# Patient Record
Sex: Female | Born: 1970 | Race: Black or African American | Hispanic: No | Smoking: Current every day smoker
Health system: Southern US, Community
[De-identification: ages and names within clinical notes are randomized; demographics above are authoritative.]

## PROBLEM LIST (undated history)

## (undated) DIAGNOSIS — E7800 Pure hypercholesterolemia, unspecified: Secondary | ICD-10-CM

## (undated) DIAGNOSIS — I10 Essential (primary) hypertension: Secondary | ICD-10-CM

## (undated) DIAGNOSIS — K219 Gastro-esophageal reflux disease without esophagitis: Secondary | ICD-10-CM

## (undated) DIAGNOSIS — F419 Anxiety disorder, unspecified: Secondary | ICD-10-CM

## (undated) DIAGNOSIS — F4310 Post-traumatic stress disorder, unspecified: Secondary | ICD-10-CM

## (undated) DIAGNOSIS — F319 Bipolar disorder, unspecified: Secondary | ICD-10-CM

## (undated) DIAGNOSIS — J45909 Unspecified asthma, uncomplicated: Secondary | ICD-10-CM

## (undated) DIAGNOSIS — F32A Depression, unspecified: Secondary | ICD-10-CM

## (undated) DIAGNOSIS — J449 Chronic obstructive pulmonary disease, unspecified: Secondary | ICD-10-CM

## (undated) DIAGNOSIS — E119 Type 2 diabetes mellitus without complications: Secondary | ICD-10-CM

## (undated) DIAGNOSIS — F1910 Other psychoactive substance abuse, uncomplicated: Secondary | ICD-10-CM

## (undated) DIAGNOSIS — F1010 Alcohol abuse, uncomplicated: Secondary | ICD-10-CM

## (undated) DIAGNOSIS — M1990 Unspecified osteoarthritis, unspecified site: Secondary | ICD-10-CM

## (undated) HISTORY — PX: TUBAL LIGATION: SHX77

## (undated) HISTORY — DX: Type 2 diabetes mellitus without complications: E11.9

## (undated) HISTORY — DX: Pure hypercholesterolemia, unspecified: E78.00

## (undated) HISTORY — DX: Depression, unspecified: F32.A

## (undated) HISTORY — DX: Unspecified osteoarthritis, unspecified site: M19.90

---

## 1998-12-26 ENCOUNTER — Emergency Department (HOSPITAL_COMMUNITY): Admission: EM | Admit: 1998-12-26 | Discharge: 1998-12-26 | Payer: Self-pay | Admitting: Emergency Medicine

## 2000-10-10 ENCOUNTER — Inpatient Hospital Stay (HOSPITAL_COMMUNITY): Admission: AD | Admit: 2000-10-10 | Discharge: 2000-10-10 | Payer: Self-pay | Admitting: Obstetrics

## 2000-10-11 ENCOUNTER — Encounter: Payer: Self-pay | Admitting: Obstetrics

## 2001-02-15 ENCOUNTER — Inpatient Hospital Stay (HOSPITAL_COMMUNITY): Admission: AD | Admit: 2001-02-15 | Discharge: 2001-02-15 | Payer: Self-pay | Admitting: *Deleted

## 2001-02-19 ENCOUNTER — Ambulatory Visit (HOSPITAL_COMMUNITY): Admission: RE | Admit: 2001-02-19 | Discharge: 2001-02-19 | Payer: Self-pay | Admitting: *Deleted

## 2001-03-22 ENCOUNTER — Inpatient Hospital Stay (HOSPITAL_COMMUNITY): Admission: AD | Admit: 2001-03-22 | Discharge: 2001-03-22 | Payer: Self-pay | Admitting: Obstetrics & Gynecology

## 2001-05-09 ENCOUNTER — Encounter: Payer: Self-pay | Admitting: Obstetrics & Gynecology

## 2001-05-09 ENCOUNTER — Inpatient Hospital Stay (HOSPITAL_COMMUNITY): Admission: AD | Admit: 2001-05-09 | Discharge: 2001-05-09 | Payer: Self-pay | Admitting: Obstetrics & Gynecology

## 2001-05-19 ENCOUNTER — Inpatient Hospital Stay (HOSPITAL_COMMUNITY): Admission: AD | Admit: 2001-05-19 | Discharge: 2001-05-19 | Payer: Self-pay | Admitting: Obstetrics

## 2001-05-19 ENCOUNTER — Encounter: Payer: Self-pay | Admitting: Obstetrics

## 2001-05-20 ENCOUNTER — Encounter (INDEPENDENT_AMBULATORY_CARE_PROVIDER_SITE_OTHER): Payer: Self-pay | Admitting: Specialist

## 2001-05-20 ENCOUNTER — Inpatient Hospital Stay (HOSPITAL_COMMUNITY): Admission: AD | Admit: 2001-05-20 | Discharge: 2001-05-22 | Payer: Self-pay | Admitting: Obstetrics

## 2001-08-03 ENCOUNTER — Emergency Department (HOSPITAL_COMMUNITY): Admission: EM | Admit: 2001-08-03 | Discharge: 2001-08-03 | Payer: Self-pay | Admitting: Emergency Medicine

## 2002-03-03 ENCOUNTER — Emergency Department (HOSPITAL_COMMUNITY): Admission: EM | Admit: 2002-03-03 | Discharge: 2002-03-03 | Payer: Self-pay | Admitting: *Deleted

## 2002-04-11 ENCOUNTER — Inpatient Hospital Stay (HOSPITAL_COMMUNITY): Admission: EM | Admit: 2002-04-11 | Discharge: 2002-04-14 | Payer: Self-pay | Admitting: Psychiatry

## 2002-04-23 ENCOUNTER — Encounter: Admission: RE | Admit: 2002-04-23 | Discharge: 2002-04-23 | Payer: Self-pay | Admitting: Internal Medicine

## 2002-07-09 ENCOUNTER — Inpatient Hospital Stay (HOSPITAL_COMMUNITY): Admission: EM | Admit: 2002-07-09 | Discharge: 2002-07-14 | Payer: Self-pay | Admitting: Psychiatry

## 2002-09-29 ENCOUNTER — Encounter: Admission: RE | Admit: 2002-09-29 | Discharge: 2002-09-29 | Payer: Self-pay | Admitting: Internal Medicine

## 2002-10-07 ENCOUNTER — Emergency Department (HOSPITAL_COMMUNITY): Admission: EM | Admit: 2002-10-07 | Discharge: 2002-10-07 | Payer: Self-pay | Admitting: Emergency Medicine

## 2002-11-12 ENCOUNTER — Emergency Department (HOSPITAL_COMMUNITY): Admission: EM | Admit: 2002-11-12 | Discharge: 2002-11-12 | Payer: Self-pay | Admitting: Emergency Medicine

## 2003-01-14 ENCOUNTER — Inpatient Hospital Stay (HOSPITAL_COMMUNITY): Admission: EM | Admit: 2003-01-14 | Discharge: 2003-01-17 | Payer: Self-pay | Admitting: Psychiatry

## 2003-03-22 ENCOUNTER — Encounter: Payer: Self-pay | Admitting: Emergency Medicine

## 2003-03-22 ENCOUNTER — Emergency Department (HOSPITAL_COMMUNITY): Admission: EM | Admit: 2003-03-22 | Discharge: 2003-03-22 | Payer: Self-pay | Admitting: Emergency Medicine

## 2003-03-25 ENCOUNTER — Emergency Department (HOSPITAL_COMMUNITY): Admission: EM | Admit: 2003-03-25 | Discharge: 2003-03-25 | Payer: Self-pay | Admitting: Emergency Medicine

## 2003-10-08 ENCOUNTER — Emergency Department (HOSPITAL_COMMUNITY): Admission: EM | Admit: 2003-10-08 | Discharge: 2003-10-08 | Payer: Self-pay | Admitting: Emergency Medicine

## 2005-01-06 ENCOUNTER — Inpatient Hospital Stay (HOSPITAL_COMMUNITY): Admission: EM | Admit: 2005-01-06 | Discharge: 2005-01-09 | Payer: Self-pay | Admitting: Emergency Medicine

## 2005-01-09 ENCOUNTER — Ambulatory Visit: Payer: Self-pay | Admitting: Internal Medicine

## 2007-08-14 ENCOUNTER — Emergency Department (HOSPITAL_COMMUNITY): Admission: EM | Admit: 2007-08-14 | Discharge: 2007-08-14 | Payer: Self-pay | Admitting: Emergency Medicine

## 2007-09-01 ENCOUNTER — Emergency Department (HOSPITAL_COMMUNITY): Admission: EM | Admit: 2007-09-01 | Discharge: 2007-09-02 | Payer: Self-pay | Admitting: Emergency Medicine

## 2007-09-02 ENCOUNTER — Inpatient Hospital Stay (HOSPITAL_COMMUNITY): Admission: AD | Admit: 2007-09-02 | Discharge: 2007-09-05 | Payer: Self-pay | Admitting: Psychiatry

## 2007-09-06 ENCOUNTER — Ambulatory Visit: Payer: Self-pay | Admitting: *Deleted

## 2009-02-21 ENCOUNTER — Emergency Department (HOSPITAL_COMMUNITY): Admission: EM | Admit: 2009-02-21 | Discharge: 2009-02-22 | Payer: Self-pay | Admitting: Emergency Medicine

## 2009-02-22 ENCOUNTER — Ambulatory Visit: Payer: Self-pay | Admitting: *Deleted

## 2009-02-22 ENCOUNTER — Inpatient Hospital Stay (HOSPITAL_COMMUNITY): Admission: AD | Admit: 2009-02-22 | Discharge: 2009-02-25 | Payer: Self-pay | Admitting: *Deleted

## 2010-06-24 ENCOUNTER — Emergency Department (HOSPITAL_COMMUNITY)
Admission: EM | Admit: 2010-06-24 | Discharge: 2010-06-24 | Payer: Self-pay | Source: Home / Self Care | Admitting: Emergency Medicine

## 2010-09-05 LAB — BASIC METABOLIC PANEL
BUN: 13 mg/dL (ref 6–23)
CO2: 24 mEq/L (ref 19–32)
Chloride: 109 mEq/L (ref 96–112)
GFR calc non Af Amer: >60 mL/min (ref >60)
Glucose, Bld: 81 mg/dL (ref 70–99)
Potassium: 4.1 mEq/L (ref 3.5–5.1)
Sodium: 139 mEq/L (ref 135–145)

## 2010-09-05 LAB — CBC
HCT: 42.9 % (ref 36.0–46.0)
Hemoglobin: 14.6 g/dL (ref 12.0–15.0)
MCH: 30.4 pg (ref 26.0–34.0)
MCHC: 34 g/dL (ref 30.0–36.0)
MCV: 89.4 fL (ref 78.0–100.0)

## 2010-09-05 LAB — HEPATIC FUNCTION PANEL
AST: 29 U/L (ref 0–37)
Bilirubin, Direct: <0.1 mg/dL (ref 0.0–0.3)
Total Bilirubin: 0.4 mg/dL (ref 0.3–1.2)

## 2010-09-05 LAB — DIFFERENTIAL
Basophils Relative: 0 % (ref 0–1)
Eosinophils Relative: 1 % (ref 0–5)
Monocytes Absolute: 0.6 10*3/uL (ref 0.1–1.0)
Monocytes Relative: 8 % (ref 3–12)
Neutro Abs: 4.6 10*3/uL (ref 1.7–7.7)

## 2010-10-01 LAB — DIFFERENTIAL
Basophils Absolute: 0 10*3/uL (ref 0.0–0.1)
Basophils Relative: 1 % (ref 0–1)
Neutro Abs: 6.5 10*3/uL (ref 1.7–7.7)
Neutrophils Relative %: 69 % (ref 43–77)

## 2010-10-01 LAB — GC/CHLAMYDIA PROBE AMP, GENITAL
Chlamydia, DNA Probe: NEGATIVE
GC Probe Amp, Genital: NEGATIVE

## 2010-10-01 LAB — RAPID URINE DRUG SCREEN, HOSP PERFORMED
Barbiturates: NOT DETECTED
Cocaine: POSITIVE — AB
Opiates: NOT DETECTED

## 2010-10-01 LAB — CBC
MCHC: 33.9 g/dL (ref 30.0–36.0)
RBC: 4.7 MIL/uL (ref 3.87–5.11)
RDW: 13.6 % (ref 11.5–15.5)

## 2010-10-01 LAB — HEPATIC FUNCTION PANEL
ALT: 15 U/L (ref 0–35)
AST: 31 U/L (ref 0–37)
Albumin: 3.3 g/dL — ABNORMAL LOW (ref 3.5–5.2)
Bilirubin, Direct: 0.1 mg/dL (ref 0.0–0.3)
Bilirubin, Direct: <0.1 mg/dL (ref 0.0–0.3)
Indirect Bilirubin: 0.5 mg/dL (ref 0.3–0.9)

## 2010-10-01 LAB — BASIC METABOLIC PANEL
CO2: 29 mEq/L (ref 19–32)
Calcium: 9.2 mg/dL (ref 8.4–10.5)
Creatinine, Ser: 0.84 mg/dL (ref 0.4–1.2)
GFR calc Af Amer: >60 mL/min (ref >60)
Glucose, Bld: 81 mg/dL (ref 70–99)

## 2010-10-01 LAB — TSH: TSH: 4.097 u[IU]/mL (ref 0.350–4.500)

## 2010-10-01 LAB — WET PREP, GENITAL: Trich, Wet Prep: NONE SEEN

## 2010-10-13 ENCOUNTER — Emergency Department (HOSPITAL_COMMUNITY)
Admission: EM | Admit: 2010-10-13 | Discharge: 2010-10-13 | Disposition: A | Discharge status: Home / Self Care | Payer: Self-pay | Attending: Emergency Medicine | Admitting: Emergency Medicine

## 2010-10-13 DIAGNOSIS — S61509A Unspecified open wound of unspecified wrist, initial encounter: Secondary | ICD-10-CM | POA: Insufficient documentation

## 2010-10-13 DIAGNOSIS — S1093XA Contusion of unspecified part of neck, initial encounter: Secondary | ICD-10-CM | POA: Insufficient documentation

## 2010-10-13 DIAGNOSIS — X789XXA Intentional self-harm by unspecified sharp object, initial encounter: Secondary | ICD-10-CM | POA: Insufficient documentation

## 2010-10-13 DIAGNOSIS — F319 Bipolar disorder, unspecified: Secondary | ICD-10-CM | POA: Insufficient documentation

## 2010-10-13 DIAGNOSIS — F1910 Other psychoactive substance abuse, uncomplicated: Secondary | ICD-10-CM | POA: Insufficient documentation

## 2010-10-13 DIAGNOSIS — S0003XA Contusion of scalp, initial encounter: Secondary | ICD-10-CM | POA: Insufficient documentation

## 2010-10-13 DIAGNOSIS — IMO0002 Reserved for concepts with insufficient information to code with codable children: Secondary | ICD-10-CM | POA: Insufficient documentation
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
## 2010-11-08 NOTE — Discharge Summary (Signed)
Dawn, Huffman NO.:  [id number]   MEDICAL RECORD NO.:  [id number]          PATIENT TYPE:  IPS   LOCATION:  0301                          FACILITY:  [facility]   PHYSICIAN:  Jasmine Pang, M.D. DATE OF BIRTH:  06-Dec-1970   DATE OF ADMISSION:  02/22/2009  DATE OF DISCHARGE:  02/25/2009                               DISCHARGE SUMMARY   IDENTIFICATION:  This is a 40 year old African American female who was  admitted on a voluntary basis on February 22, 2009.   HISTORY OF PRESENT ILLNESS:  This is the fourth or fifth [facility] admission  for this 40 year old with a history of mood disorder and persistent  cocaine use.  She presented with suicidal thoughts of possibly cutting  her wrists.  She has been using cocaine for at least the past 8 months  now and she moved back to [place].  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.  She reports the use  of alcohol has been escalating.  She last drank half a fifth of gin  about 3 days ago and ends up drinking alcohol at least every 2-3 days  coinciding with cocaine use.  She has been smoking rock cocaine and has  used 700 dollars worth within the past couple of weeks.  She then gone  from home being ashamed of herself feeling guilty about her addiction.  She feels miserable with her addiction and feels that it is not getting  her high anymore.  She is tearful, depressed with active suicidal  thoughts.  No homicidal thought.  She has been having suicidal thoughts  daily for the past week.  As indicated above, this is the fourth or  fifth [facility] admission here.  She was last with [place] in March 2009.  She has  a history of multiple substance abuse treatment programs, she says  including [facility], [facility], and several other long term programs in the past.  She has no current outpatient treatment.  For further admission  information, see psychiatric admission assessment.  She was given an  initial axis I  diagnoses of major depression, rule out posttraumatic  stress disorder and alcohol abuse and dependence, and cocaine  dependence.  On axis III, she was given the diagnosis of  gastroesophageal reflux disease with a history of gastric ulcer.  She is  also ruled out for substance-induced asthma.   PHYSICAL FINDINGS:  There were no acute physical or medical problems.  The patient's physical exam was done in the emergency room and is noted  in the record.   DIAGNOSTIC STUDIES:  Urine drug screen was positive for cocaine and  marijuana.  Alcohol level was less than 5.  Normal chemistry with a BUN  of 18, creatinine of 0.84, and normal CBC.  Platelets are 179,000,  hemoglobin 14.6.   HOSPITAL COURSE:  Upon admission, the patient was started on doxycycline  100 mg p.o. b.i.d. for 2 weeks for vaginal infection, this was diagnosed  in the ED, Ambien 10 mg p.o. q.h.s., and Librium detox protocol.  She  was also started on Seroquel 25 mg q.4 h. p.r.n. psychosis and  agitation, Protonix 40 mg daily, Flovent 44 mcg 2 puffs b.i.d.,  albuterol MDI 2 puffs q.a.m. and q.18:00.  In individual sessions, the  patient was casually dressed with fair eye contact.  Speech was normal  rate and flow.  Psychomotor activity was within normal limits.  Mood was  depressed and anxious.  Affect consistent with mood.  There was no  suicidal or homicidal ideation.  No evidence of a thought disorder or  psychosis.  As hospitalization progressed, she became less depressed  though on February 24, 2009, she complained of still being anxious.  Decided to start Celexa and we were anticipating discharge the following  day.  On February 25, 2009, her sleep was good and appetite was good.  Her mood was less depressed and less anxious.  Affect was consistent  with mood.  There was no suicidal or homicidal ideation.  No thoughts of  self-injurious behavior.  No auditory or visual hallucinations.  No  paranoia or delusions.   Thoughts were logical and goal-directed.  Thought content, no predominant theme.  Cognitive was grossly intact.  Insight good.  Judgment good.  Impulse control good.  She was having no  side effects to her medications.  She wanted to go home today and it was  felt, she was safe for discharge.   DISCHARGE DIAGNOSES:  Axis I:  Major depressive, recurrent, severe and  polysubstance dependence.  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  Axis III:  Gastroesophageal reflux disease with a history of gastric  ulcer.  Also, rule out substance-induced asthma.  Axis IV:  Severe (issues with social environment, parenting, burden of  psychiatric illness, burden of medical problems).  Axis V:  Global assessment of functioning was 50 upon discharge.  GAF  was 42 upon admission.  GAF highest past year was 60-65.   DISCHARGE PLANS:  There were no specific activity level or dietary  restrictions.   POSTHOSPITAL CARE PLANS:  The patient will go to the [facility] on  Thursday, March 04, 2009 at 10 a.m.  She will also go to [facility]  for outpatient substance abuse treatment.   DISCHARGE MEDICATIONS:  1. Doxycycline 100 mg twice daily through March 08, 2009.  2. Flovent 44 mcg inhaler twice daily.  3. Albuterol 90 mcg inhaler twice daily.  4. Ambien 10 mg at bedtime if needed.  5. Celexa 20 mg daily.      Jasmine Pang, M.D.  Electronically Signed     BHS/MEDQ  D:  02/25/2009  T:  02/26/2009  Job:  [id number]
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
## 2010-11-11 NOTE — Discharge Summary (Signed)
NAME:  [name], [name] NO.:  [id number]   MEDICAL RECORD NO.:  [id number]                   PATIENT TYPE:  IPS   LOCATION:  0302                                 FACILITY:  [facility]   PHYSICIAN:  Jeanice Lim, M.D.              DATE OF BIRTH:  01/26/1971   DATE OF ADMISSION:  07/09/2002  DATE OF DISCHARGE:  07/14/2002                                 DISCHARGE SUMMARY   IDENTIFYING DATA:  This is a 40 year old African-American female [facility], presenting with suicidal plan to cut self, relapsed on  crack cocaine 1 month ago, feeling increased depressive symptoms, with  irritability and paranoia, as well as anxiety and agitation, endorsing  suicidal ideation and possible visual hallucinations in her peripheral  vision.   ADMISSION MEDICATIONS:  Vistaril 25 mg t.i.d., Seroquel not taking secondary  to sedation, Protonix 40 mg q.a.m. and Paxil 12.5 q.a.m.   ALLERGIES:  No known drug allergies.   PHYSICAL EXAMINATION:  Essentially within normal limits, neurologically  nonfocal.   ROUTINE ADMISSION LABS:  Essentially within normal limits.  Potassium  slightly at 3.2, pregnancy test negative, urine drug screen positive for  cocaine.  Hemoglobin was 12.9.   MENTAL STATUS EXAM:  Medium-built, fully alert, tearful, anxious but  composed, cooperative black female, articulate.  Speech within normal  limits.  Mood depressed, affect anxious and guarded.  Thought process goal  directed, thought content positive for paranoia and possible visual  hallucinations as well as suicidal ideation, contracting for safety,  cognitively intact.  Judgment and insight fair.   ADMISSION DIAGNOSES:   AXIS I:  1. Bipolar disorder not otherwise specified.  2. Post-traumatic stress disorder.  3. Cocaine abuse rule out dependence.   AXIS II:  None.   AXIS III:  Gastroesophageal reflux disease, history of gastric ulcer and  eczema.   AXIS IV:   Moderate, stressor with primary support group and social  environment.   AXIS V:  24/60.   HOSPITAL COURSE:  The patient was admitted and ordered routine p.r.n.  medications, underwent further monitoring, and was encouraged to participate  in individual, group and milieu therapy.  The patient was struggling with  flashbacks and was unable to get a therapist, reported suicidal ideation and  paranoid ideation and cravings at the time of admission, complaining of some  withdrawal symptoms which decreased as she was supportedly detoxed.  She  showed improvement in judgment and insight and decrease in paranoia as  Risperdal was optimized and Paxil optimized for depressive symptoms and  paranoia.  The patient reported motivation to see a therapist and  participated in aftercare planning.   CONDITION ON DISCHARGE:  Markedly improved.  Mood was more euthymic, affect  brighter, thought process goal directed.  Thought content negative for  dangerous ideation or psychotic symptoms.   DISCHARGE MEDICATIONS:  1. Protonix 40 mg b.i.d. f  2.  Tegretol 200 mg b.i.d.  3. Claritin 10 mg q.a.m.  4. Paxil CR 25 mg q.a.m.  5. Risperdal 0.5 q.a.m., q. 3 p.m. and 2 q.h.s.  6. Vistaril 25 mg b.i.d.  7. Trazodone 50 mg q.h.s. p.r.n. insomnia.   DISPOSITION:  The patient was to follow up with Drucie Opitz on February 5  and in [facility] with Dr. Lourdes Sledge and Dr. Kieth Brightly on February 1  at 1:45.   DISCHARGE DIAGNOSES:   AXIS I:  1. Bipolar disorder not otherwise specified.  2. Post-traumatic stress disorder.  3. Cocaine abuse rule out dependence.   AXIS II:  None.   AXIS III:  Gastroesophageal reflux disease, history of gastric ulcer and  eczema.   AXIS IV:  Moderate, stressor with primary support group and social  environment.   AXIS V:  Global assessment of function on discharge was 55.                                                 Jeanice Lim, M.D.    JEM/MEDQ  D:   07/25/2002  T:  07/25/2002  Job:  [id number]

## 2010-11-11 NOTE — Op Note (Signed)
NAME:  [name], [name]              ACCOUNT NO.:  [id number]   MEDICAL RECORD NO.:  [id number]          PATIENT TYPE:  INP   LOCATION:  5032                         FACILITY:  [facility]   PHYSICIAN:  Tyrone Apple. Karleen Hampshire, M.D.DATE OF BIRTH:  31-Oct-1970   DATE OF PROCEDURE:  01/07/2005  DATE OF DISCHARGE:                                 OPERATIVE REPORT   PREOPERATIVE DIAGNOSES:  1.  Periocular cellulitis, O.D.  2.  preseptal cellulitis on CT scan, O.D.   POSTOPERATIVE DIAGNOSES:  1.  Status post examination under anesthesia.  2.  Status post incision and drainage of a right periorbital abscess with      copious irrigation.   PROCEDURE:  1.  Examination under anesthesia.  2.  Incision and drainage of right periorbital abscess.   SURGEON:  Tyrone Apple. Karleen Hampshire, MD.   ANESTHESIA:  General with endotracheal intubation.   INDICATIONS FOR PROCEDURE:  [name] is a 40 year old white female  with a history of substance abuse, who was admitted secondary to a right  periorbital abscess with swelling and deformation of the right periorbit  secondary to the inflammation.  The patient subsequently had a CT scan,  which demonstrated pre-septal inflammation with no intraocular or post-  septal component to the inflammatory process.  However, the physical  examination demonstrated a focal area of inflammation, induration, and  abscess.  This procedure was then indicated to drain the infected  periorbital tissues and to perform an examination under anesthesia.  The  risk and benefits of the procedure were explained to the patient, and the  patient's parent.  Prior to the procedure, informed consent was obtained.   DESCRIPTION OF TECHNIQUE:  The patient was taken to the operating room and  placed in the supine position.  The entire patient's face was prepped and  draped in the usual sterile manner.  Attention was turned to the right eye  after the induction of general anesthesia and  establishment of an  endotracheal airway.  Using an 18-gauge needle, the focal point of the  abscess was penetrated and an incision was made into the subcutaneous tissue  and the purulent material was then aspirated into a 30-mL syringe.  The  purulent aspirate was then sent for a gram stain, CNS.  This procedure and  maneuver was repeated in all possible meridians, keeping the level of  penetration above the periosteum and above the posterior lamella of the lid.  A focal pocket of abscess was released in a defect measuring approximately 2  cm.  The sharp needle was replaced with a 14-gauge blunted cannula and this  was then used to probe into the various pockets of subcutaneous tissue and  to the level of the orbicularis and down to the periosteum to remove any  other pockets of abscess.  Once this was completed, the initial incision was  extended for approximately 0.4 mm and the wound and abscess bed were then  copiously irrigated with Vancomycin and Tobramycin antibiotic solution.  This was repeated multiple times.  Hemostasis was achieved with irrigation  and at the conclusion of this procedure,  TobraDex ointment was instilled  into the incised abscess bed and a Benzoin pressure dressing was applied.  Next, the previously placed protective corneal shield was removed and a lid  speculum was placed into the inferior fornices of the right eye.  The globe  was examined and found to be free.  The conjunctiva was quiet.  The globe  was able to be moved manually in all directions.  The cornea was intact, the  pupil was reactive and round, and the iris was unaffected by the  inflammatory process.  At the conclusion of the procedure, antibiotic  ointment was instilled in the inferior fornices of the right eye, and the  patient subsequently awakened from general anesthesia and returned to the  recovery room awake and in improved condition.       MAS/MEDQ  D:  01/07/2005  T:  01/07/2005  Job:   [id number]

## 2010-11-11 NOTE — Discharge Summary (Signed)
NAME:  [name], [name] NO.:  [id number]   MEDICAL RECORD NO.:  [id number]                   PATIENT TYPE:  IPS   LOCATION:  0301                                 [facility]ILITY[facility]  [facility]   PHYSICIAN:  Jasmine Pang, M.D.              DATE OF BIRTH:  1970-08-03   DATE OF ADMISSION:  01/14/2003  DATE OF DISCHARGE:  01/17/2003                                 DISCHARGE SUMMARY   BRIEF HISTORY OF PRESENT ILLNESS:  The patient is a 40 year old married,  female.  She had a history of cocaine abuse and bipolar disorder.  She  relapsed on cocaine three weeks ago.  She stated she has been on and off  this drug frequently for the past year.  She had lost the custody of her  children to DSS.  She was feeling irritable, sad, hopeless, worthless, and  had positive suicidal ideation.  She denied homicidal ideation.  She was  seen at the [facility] for psychiatric treatment.  She had no significant medical problems presently.  In the past, she has  been diagnosed as positive for syphilis and other STDs treated at the  [facility].  She had no known drug allergies.   For further information, see psychiatric admission assessment.   PHYSICAL EXAMINATION:  See physical examination done by nurse practitioner.   ADMISSION LABORATORY DATA:  UDS was positive for cocaine.  Urinalysis:  Positive wbc's 3-6.  Urine pregnancy test was negative.  The other labs were  not available at the time of this dictation.   HOSPITAL COURSE:  The patient was admitted to the dual diagnosis program.  She was continued on Protonix 40 mg q.a.m., Paxil CR 25 mg q.a.m., trazodone  200 mg q.h.s., Tegretol 100 mg q.h.s., and Risperdal 1 mg p.o. q.h.s.  She  settled into unit routine with no significant problems.  In addition to her  other medications, her Tegretol was increased to 200 mg p.o. b.i.d. and she  was placed on Vistaril 25 mg p.o. b.i.d.  She had a  conjunctivitis and was  given eye drops to each eye b.i.d.  She was also given a nicotine patch 21  mg daily.  She was also given Cipro 500 mg p.o. b.i.d. x 3 days for a  possible urinary tract infection.  The patient tolerated her medications  well with no significant side effects.  She was able to participate  appropriately in unit therapeutic groups and activities including her dual  diagnosis groups.  At the time of discharge, her mental status had improved,  her mood was less depressed, she was no longer suicidal, she was less  anxious, she was more hopeful, there was no homicidal ideation, there was no  psychosis, there was no thought disorder.  Cognitive exam was within normal  limits.  She felt better able to continue her treatment on an outpatient  basis.   DISCHARGE DIAGNOSES:   AXIS I:  1. Bipolar disorder, depressed mood.  2. Cocaine dependence.   AXIS II:  None.   AXIS III:  1. Conjunctivitis.  2. Urinary tract infection.   AXIS IV:  Severe (has lost custody of children).   AXIS V:  Global assessment of functioning current is 50/60.   DISCHARGE MEDICATIONS:  1. Risperidone 1 mg p.o. q.h.s.  2. Trazodone 200 mg p.o. q.h.s.  3. Paxil CR 25 mg daily.  4. Hydroxyzine pamoate 25 mg p.o. b.i.d.  5. Protonix 40 mg p.o. daily.  6. Ciprofloxacin 500 mg p.o. b.i.d. x 3 days (this was actually used for     conjunctivitis rather a urinary tract infection).   ACTIVITY LEVEL:  No restrictions.   DIET:  No restrictions.   POST HOSPITAL CARE PLAN:  Will return to the [facility] January 21, 2003, at 2:00 p.m. to see Jolly Mango.                                               Jasmine Pang, M.D.    BHS/MEDQ  D:  03/05/2003  T:  03/06/2003  Job:  [id number]   cc:   [facility]

## 2010-11-11 NOTE — Discharge Summary (Signed)
NAME:  [name], [name] NO.:  [id number]   MEDICAL RECORD NO.:  [id number]                   PATIENT TYPE:  IPS   LOCATION:  0301                                 FACILITY:  [facility]   PHYSICIAN:  Jeanice Lim, M.D.              DATE OF BIRTH:  14-Jun-1971   DATE OF ADMISSION:  01/14/2003  DATE OF DISCHARGE:  01/17/2003                                 DISCHARGE SUMMARY   IDENTIFYING DATA:  This is a 40 year old married African-American female  with a long history of cocaine abuse and bipolar disorder, relapsing three  weeks prior to admission with suicidal ideation at the time of admission.   PREVIOUS MEDICATIONS:  1. Paxil.  2. Tegretol.  3. Risperdal.  4. Vistaril.  5. Trazodone.   DRUG ALLERGIES:  No known drug allergies.   PHYSICAL EXAMINATION:  GENERAL:  Within normal limits.  NEUROLOGIC:.  Nonfocal.   LABORATORY DATA:  Urine pregnancy test was negative.  Urine drug screen:  Positive for cocaine.  Some white blood cells 3-6 in the urine.   MENTAL STATUS EXAM:  Fully alert in no acute distress, swollen red eyes,  calm, coherent, chewing gum, good eye contact.  Speech: Within normal  limits.  Mood: Irritable, depressed, anxious.  Thought process: Goal  directed; positive suicidal ideation, mild paranoia.  Cognitive: Intact.  Judgment and insight: Fair.   ADMISSION DIAGNOSES:   AXIS I:  1. Bipolar disorder, type II.  2. Cocaine abuse.   AXIS II:  None.   AXIS III:  1. Hepatitis B.  2. Rule out urinary tract infection.   AXIS IV:  Severe problems with secondary to substance abuse and legal  problems.   AXIS V:  25/60   HOSPITAL COURSE:  The patient was admitted, ordered routine p.r.n.  medications, underwent further monitoring, and was encouraged to participate  in individual, group, and milieu therapy.  The patient described a clear  history of mood swings and suicidal ideation.  The patient reported that she  may be able to  stay with the mother until she was able to get into a halfway  house and reported motivation to remain sober.  The patient was seen by  Jasmine Pang, M.D., and reported no psychotic symptoms, no dangerous  ideation, indicating a response to clinical intervention.   CONDITION ON DISCHARGE:  Condition on discharge was improved.  Mood was more  euthymic.  Affect: Brighter.  Thought processes: Goal directed.  Thought  content: Negative for dangerous ideation or psychotic symptoms.   DISCHARGE MEDICATIONS:  1. Risperdal 1 mg q.h.s.  2. Trazodone 200 mg q.h.s.  3. Paxil CR 25 mg q.a.m.  4. Vistaril 50 mg q.8h. p.r.n. anxiety.  5. Protonix 40 mg q.a.m.  6. Cipro 500 mg b.i.d. for three days.   FOLLOW UP:  The patient was to follow up at [facility] on July  28.   DISCHARGE DIAGNOSES:   AXIS I:  1. Bipolar disorder, type II.  2. Cocaine abuse.   AXIS II:  None.   AXIS III:  1. Hepatitis B.  2. Rule out urinary tract infection.   AXIS IV:  Severe problems with secondary to substance abuse and legal  problems.   AXIS V:  Global assessment of functioning on discharge was 55.                                               Jeanice Lim, M.D.    JEM/MEDQ  D:  02/12/2003  T:  02/12/2003  Job:  [id number]

## 2010-11-11 NOTE — Discharge Summary (Signed)
NAME:  [name], [name] NO.:  [id number]   MEDICAL RECORD NO.:  [id number]                   PATIENT TYPE:  IPS   LOCATION:  0303                                 [facility]ILITY[facility]   PHYSICIAN:  Jeanice Lim, M.D.              DATE OF BIRTH:  10/21/1970   DATE OF ADMISSION:  04/11/2002  DATE OF DISCHARGE:  04/14/2002                                 DISCHARGE SUMMARY   IDENTIFYING DATA:  A 40 year old married Caucasian female, presenting with a  history of bipolar disorder, reporting having mood swings, experiencing  anger, depression, suicidal thoughts, planning to cut her wrist.   ADMISSION MEDICATIONS:  Prozac and trazodone in the past, off medications  for 3 months.   ALLERGIES:  No known drug allergies.   PHYSICAL EXAMINATION:  Essentially within normal limits, neurologically  nonfocal.   ROUTINE ADMISSION LABORATORIES:  Alcohol level less than 5.  CBC and CMET  within normal limits.  Urine drug screen positive for cocaine.   MENTAL STATUS EXAM:  Alert, young, casually dressed _____________ female,  cooperative, fair eye contact.  Speech clear, soft spoken, mood anxious,  nauseated.  Affect anxious, irritable at times.  Thought process goal  directed, thought content positive for visual hallucinations, no dangerous  ideation, cognitively intact.  Judgment and insight fair to poor.   ADMISSION DIAGNOSES:   AXIS I:  1. Depressive disorder not otherwise specified.  2. Cocaine abuse.  3. Rule out post-traumatic stress disorder.   AXIS II:  None.   AXIS III:  History of ulcers and elevated blood pressure.   AXIS IV:  Moderate stressors, including house and economic and other  psychosocial stressors.   AXIS V:  30/65.   HOSPITAL COURSE:  The patient was admitted and ordered routine p.r.n.  medications, underwent further monitoring, and was encouraged to participate  in individual, group and milieu therapy, including substance abuse  treatment.  The patient was resumed on Nexium, Seroquel, Paxil targeting  depressive symptoms and Trileptal targeting mood lability.  Trileptal was  discontinued and Tegretol started which the patient had tolerated well in  the past, the patient reported positive response to medication changes and  crisis intervention.   CONDITION ON DISCHARGE:  Improved.  Mood was more stable, affect brighter,  thought process goal directed.  Thought content negative for dangerous  ideation or psychotic symptoms.  The patient reported motivation to remain  sober and  be compliant with followup plan.   DISCHARGE MEDICATIONS:  1. Protonix 40 mg 2 q.a.m.  2. Seroquel 25 mg b.i.d. and 2 q.h.s.  3. Paxil CR 12.5 mg q.a.m.  4. Tegretol 200 mg b.i.d.   DISPOSITION:  The patient was to follow up with the [facility] on October 22 at 1:30.   DISCHARGE DIAGNOSES:   AXIS I:  1. Depressive disorder not otherwise specified.  2. Cocaine abuse.  3.  Rule out post-traumatic stress disorder.   AXIS II:  None.   AXIS III:  History of ulcers and elevated blood pressure.   AXIS IV:  Moderate stressors, including house and economic and other  psychosocial stressors.   AXIS V:  Global assessment of function on discharge was 55.                                                 Jeanice Lim, M.D.    JEM/MEDQ  D:  05/15/2002  T:  05/16/2002  Job:  [id number]

## 2010-11-11 NOTE — Discharge Summary (Signed)
[name], [name] NO.:  [id number]   MEDICAL RECORD NO.:  [id number]          PATIENT TYPE:  INP   LOCATION:  5032                         FACILITY:  [facility]   PHYSICIAN:  Alvester Morin, M.D.  DATE OF BIRTH:  [birth year]   DATE OF ADMISSION:  01/06/2005  DATE OF DISCHARGE:  01/09/2005                                 DISCHARGE SUMMARY   DISCHARGE DIAGNOSES:  1.  Periorbital preseptal cellulitis - resolving.  2.  Bipolar disorder - stabilizing on discharge.  3.  Cocaine abuse - urine drug screen positive for cocaine and      benzodiazepines.  4.  Hypokalemia - resolving.   DISCHARGE MEDICATION:  Doxycycline 100 mg one p.o. b.i.d. x10 days.   CONDITION ON DISCHARGE:  The patient was in stable condition at discharge  and was told to follow up in the outpatient clinic with Manning Charity,  M.D., on January 13, 2005, at 2:30 p.m. for a stat BMET and CBC and evaluation  of her periorbital cellulitis.  Patient was also advised to follow up with  Dr. Karleen Hampshire at her convenience.   PROCEDURES:  1.  CT of the orbit with contrast on January 05, 2005, showed:      1.  Right orbital soft tissue swelling most consistent with preseptal          periorbital cellulitis.      2.  Globe and post septal orbit, otherwise unremarkable.      3.  Moderate inflammation of the lacrimal gland is associated.  2.  Chest x-ray January 07, 2005, showed no acute disease.  3.  Incision and drainage of the right periorbital abscess with copious      irrigation on January 07, 2005.   CONSULTATION:  Tyrone Apple. Karleen Hampshire, M.D., ophthalmology.   ADMISSION HISTORY AND PHYSICAL WITH LABS:  This is a 40 year old female with  a history of hepatitis B, conjunctivitis, cocaine abuse and bipolar disease  who presents to the emergency department with a three-day history of  gradually worsening swelling and redness of her right eyelid.  The patient  was helping her friend move out and was cleaning his house and  slept on the  couch.  When she awoke, she had a bump on her eyelid.  She thinks it was a  spider bite.  The friend has a couple of dogs but they were not present when  she was there.  Currently she has right-sided facial pain x1 day and a  headache.  She thinks she has been congested with allergies recently.  She  also recently had an ear infection on the right side with drainage a couple  of weeks ago. She tried peroxide and Neosporin on the bug bite with no  relief.  She reports a  history of ear tubes when she was [age].  No other  HEENT surgeries.  She was also recently discharged from prison on October 08, 2004.   PAST MEDICAL HISTORY:  1.  Hepatitis B.  2.  Conjunctivitis.  3.  UTI.  4.  Bipolar type II with suicidal ideations,  visual hallucinations and post      traumatic stress disorder.  5.  Cocaine abuse.  6.  Hypokalemia.  7.  GERD.  8.  Gestational hypertension.  9.  Bilateral tubal ligation in November of 2002.   PHYSICAL EXAMINATION:  GENERAL APPEARANCE:  She was somnolent but arousable,  in no acute distress.  VITAL SIGNS:  On admission, pulse 80, blood pressure 142/84, temperature  98.4, respiratory rate 18, and she was sating 99% on room air.  HEENT:  Eyes:  Her right eyelid was swollen with erythema and edema.  Question pustular collection at the top of the lid.  ENT:  Bilateral mild  erythema on the periphery of her tympanic membranes.  No fluid observed.  Cerumen in the ear canal.  Canal is without erythema, no discharge.  NECK:  No masses.  Right side was tender.  LUNGS:  Clear to auscultation bilaterally.  CARDIOVASCULAR:  Regular rate and rhythm.  No murmurs.  ABDOMEN:  Soft and nontender.  Positive bowel sounds. No CVA tenderness.  EXTREMITIES:  There were 2+ radial artery/2+ dorsalis pedis pulses  bilaterally.  Feet with multiple areas of skin breakdown between the toes  and the onychomycosis of the nails.  SKIN:  Warm and dry.  MUSCULOSKELETAL:  Moved  all four extremities spontaneously.  PSYCHIATRIC:  Appropriate.   LABORATORY DATA:  In the ER, she had an ISTAT which showed a hemoglobin of  15.6, hematocrit 46.0, sodium 139, potassium 3.3, chloride 106, glucose 79,  BUN 6.  When she had a proper complete metabolic panel approximately 12  hours after being seen in the emergency room, her sodium was 138, potassium  3.5, chloride 109, bicarb 21, glucose 100, BUN 3, creatinine 0.6.  Total  bilirubin 0.6, alkaline phosphatase 65, AST 17, ALT 11, total protein 5.8,  albumin 2.9, calcium 8.6.  CBC on admission showed a white count of 12.2,  hemoglobin 13.9, hematocrit 41.6, platelets 249 with an absolute neutrophil  count of 9.7.  Her blood alcohol level was less than 5.  Her Beta HCG was  negative.   HOSPITAL COURSE:  PROBLEM #1 -  PERIORBITAL PRESEPTAL CELLULITIS:  The  patient was admitted to a regular floor and ophthalmology was consulted  immediately with a diagnosis of preseptal cellulitis.  During her stay, she  had an incision and drainage of the right periorbital abscess with  irrigation done by Dr. Karleen Hampshire.  She was started on vancomycin and Zosyn  while the cultures from the pus in the abscess were pending.  While she was  in the hospital, her white count slowly resolved and on discharge, was  normal at 9.2; her hemoglobin remained stable.  Throughout her stay, we  worked her up for various sexually transmitted diseases that might have made  her more susceptible to getting periorbital cellulitis such as HIV which was  nonreactive, syphilis which was also nonreactive.  Her GC and Chlamydia  results were negative.  Her TSH was normal as was her free T4 and the right  eyelid culture showed few methicillin resistant Staphylococcus aureus which  were sensitive to tetracyclines.  Her blood cultures were negative x2 sets  and her anaerobic cultures were negative.  Therefore, we felt it would be safe to let the patient go home on a 10-day  course of Doxycycline after she  had received a four-day course of vancomycin and Zosyn while in the  hospital.  She was given Doxycycline 100 mg 20 tablets from the outpatient  clinic so that we would insure her compliance of taking the medication.   PROBLEM #2 -  BIPOLAR DISORDER:  The patient was extremely lethargic on the  day of admission and we feel that this was most likely secondary to her  recent cocaine use and benzodiazepine use.  As the next day, the patient was  more awake and alert.  She denied any suicidal ideation, homicidal ideation  and her mood was stable and upbeat during her hospital admission.  We  actually had arranged for Dr. Jeanie Sewer to see the patient but since she was  stable as far as her bipolar disease was considered, we felt that it would  be safe to let her be discharged and we gave her information to follow up  with behavioral health to address her psychiatric needs.   PROBLEM #3 -  COCAINE ABUSE:  Her UDS on admission was positive for cocaine  and benzodiazepines.  She has a history of cocaine abuse.  We advised the  patient to go to [facility] on the day of discharge to address her substance abuse  needs and she was agreeable to that.   PROBLEM #4 -  HYPOKALEMIA:  While she was in the hospital, the patient  received several doses of potassium chloride in order to increase her  potassium level and it was slowly resolving throughout her admission and on  the day of discharge, it had gone back down to 3.1 so we gave her more  potassium chloride as she was leaving the hospital and asked her to follow  up with Dr. Lyda Perone in the outpatient clinic within a week to have her  potassium level checked and to be placed on potassium chloride at that time  if needed.   DISCHARGE LABORATORY DATA AND VITALS:  On January 09, 2005, the patient's  sodium was 141, potassium 3.1, chloride 109, bicarb 25, glucose 125, BUN 5,  creatinine 1.0, calcium 8.5.  Magnesium level was ordered  but never  received.  Her white cell count was 9.2, hemoglobin 12.5, hematocrit 37.4,  platelets 247.   Vital signs on discharge showed a temperature of 98, pulse 77, respirations  20, blood pressure 151/99, and she was saturating at 96% on room air.   PENDING LABS:  None.       EA/MEDQ  D:  01/25/2005  T:  01/26/2005  Job:  [id number]   cc:   Casimiro Needle A. Karleen Hampshire, M.D.  [address] [address]  [place]  [place] [address]  Fax: [phone]

## 2010-11-11 NOTE — Op Note (Signed)
[facility]  Patient:    [name], [name] Visit Number: [id number] MRN: [id number]          Service Type: OBS Location: 910A [id number] [id number] Attending Physician:  Tammi Sou Dictated by:   Conni Elliot, M.D. Proc. Date: 05/21/01 Admit Date:  05/20/2001 Discharge Date: 05/22/2001                             Operative Report  PREOPERATIVE DIAGNOSES:       Desire for surgical sterilization.  POSTOPERATIVE DIAGNOSES:      Desire for surgical sterilization.  OPERATION:                    Modified bilateral Pomeroy tubal ligation.  OPERATOR:                     Conni Elliot, M.D.  ANESTHESIA:                   General orotracheal.  FINDINGS:                     Uterus, tubes, and ovaries normal for post gravid state.  PROCEDURE:                    After placing the patient under general orotracheal anesthesia, the patient was supine and was prepped and draped in a sterile fashion.  A periumbilical incision was made and incision was made through the skin and subcutaneous fascia.  Peritoneal cavity entered.  Right fallopian tube was identified and grasped with Babcock clamp, followed out to fimbriated end.  A segment of tube was brought into the operative field, double suture ligated, and approximately 1.5-2 cm segment excised.  Hemostasis adequate.  Same procedure done on the opposite side.  Hemostasis was again adequate.  Anterior peritoneum, fascia, subcutaneous, and skin were closed in routine fashion.  Estimated blood loss less than 10 cc.  Needle and sponge counts correct. Dictated by:   Conni Elliot, M.D. Attending Physician:  Tammi Sou DD:  06/13/01 TD:  06/13/01 Job: [id number]
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
## 2011-03-20 LAB — BASIC METABOLIC PANEL
BUN: 11
Chloride: 107
Potassium: 3.6
Sodium: 140

## 2011-03-20 LAB — DIFFERENTIAL
Eosinophils Absolute: 0
Eosinophils Relative: 0
Lymphs Abs: 2.9
Monocytes Absolute: 0.6
Monocytes Relative: 8

## 2011-03-20 LAB — CBC
HCT: 36.1
Hemoglobin: 12.3
MCV: 85.9
Platelets: 275
Platelets: 277
RBC: 4.2
RDW: 15.1
WBC: 7.5
WBC: 8

## 2011-03-20 LAB — LIPID PANEL
HDL: 50
LDL Cholesterol: 77
Total CHOL/HDL Ratio: 2.9
Triglycerides: 99
VLDL: 20

## 2011-03-20 LAB — RAPID URINE DRUG SCREEN, HOSP PERFORMED: Cocaine: POSITIVE — AB

## 2011-03-20 LAB — VALPROIC ACID LEVEL: Valproic Acid Lvl: 37.9 — ABNORMAL LOW

## 2011-03-20 LAB — HEPATIC FUNCTION PANEL
AST: 14
Albumin: 2.8 — ABNORMAL LOW
Total Bilirubin: 0.5
Total Protein: 5.9 — ABNORMAL LOW

## 2011-03-20 LAB — PREGNANCY, URINE: Preg Test, Ur: NEGATIVE

## 2011-03-20 LAB — ETHANOL: Alcohol, Ethyl (B): <5

## 2011-07-25 ENCOUNTER — Emergency Department (HOSPITAL_COMMUNITY)
Admission: EM | Admit: 2011-07-25 | Discharge: 2011-07-26 | Disposition: A | Discharge status: Home / Self Care | Payer: Medicaid Other | Attending: Emergency Medicine | Admitting: Emergency Medicine

## 2011-07-25 ENCOUNTER — Encounter (HOSPITAL_COMMUNITY): Payer: Self-pay | Admitting: *Deleted

## 2011-07-25 DIAGNOSIS — F329 Major depressive disorder, single episode, unspecified: Secondary | ICD-10-CM | POA: Insufficient documentation

## 2011-07-25 DIAGNOSIS — F1920 Other psychoactive substance dependence, uncomplicated: Secondary | ICD-10-CM | POA: Insufficient documentation

## 2011-07-25 DIAGNOSIS — R5381 Other malaise: Secondary | ICD-10-CM | POA: Insufficient documentation

## 2011-07-25 DIAGNOSIS — Z79899 Other long term (current) drug therapy: Secondary | ICD-10-CM | POA: Insufficient documentation

## 2011-07-25 DIAGNOSIS — F3289 Other specified depressive episodes: Secondary | ICD-10-CM | POA: Insufficient documentation

## 2011-07-25 HISTORY — DX: Post-traumatic stress disorder, unspecified: F43.10

## 2011-07-25 HISTORY — DX: Anxiety disorder, unspecified: F41.9

## 2011-07-25 HISTORY — DX: Bipolar disorder, unspecified: F31.9

## 2011-07-25 LAB — URINALYSIS, ROUTINE W REFLEX MICROSCOPIC
Nitrite: NEGATIVE
Specific Gravity, Urine: 1.026 (ref 1.005–1.030)
pH: 5.5 (ref 5.0–8.0)

## 2011-07-25 LAB — DIFFERENTIAL
Basophils Relative: 0 % (ref 0–1)
Eosinophils Absolute: 0 10*3/uL (ref 0.0–0.7)
Eosinophils Relative: 0 % (ref 0–5)
Lymphs Abs: 3 10*3/uL (ref 0.7–4.0)

## 2011-07-25 LAB — RAPID URINE DRUG SCREEN, HOSP PERFORMED
Amphetamines: NOT DETECTED
Benzodiazepines: NOT DETECTED
Cocaine: POSITIVE — AB
Opiates: NOT DETECTED
Tetrahydrocannabinol: POSITIVE — AB

## 2011-07-25 LAB — COMPREHENSIVE METABOLIC PANEL
ALT: 15 U/L (ref 0–35)
Albumin: 4 g/dL (ref 3.5–5.2)
BUN: 8 mg/dL (ref 6–23)
Calcium: 10.3 mg/dL (ref 8.4–10.5)
GFR calc Af Amer: >90 mL/min (ref >90)
Glucose, Bld: 93 mg/dL (ref 70–99)
Sodium: 136 meq/L (ref 135–145)
Total Protein: 8.3 g/dL (ref 6.0–8.3)

## 2011-07-25 LAB — CBC
MCH: 29.1 pg (ref 26.0–34.0)
MCHC: 34 g/dL (ref 30.0–36.0)
MCV: 85.7 fL (ref 78.0–100.0)
Platelets: 251 10*3/uL (ref 150–400)
RBC: 4.46 MIL/uL (ref 3.87–5.11)

## 2011-07-25 LAB — GLUCOSE, CAPILLARY: Glucose-Capillary: 89 mg/dL (ref 70–99)

## 2011-07-25 LAB — ETHANOL: Alcohol, Ethyl (B): <11 mg/dL (ref 0–11)

## 2011-07-25 LAB — POCT PREGNANCY, URINE: Preg Test, Ur: NEGATIVE

## 2011-07-25 LAB — URINE MICROSCOPIC-ADD ON

## 2011-07-25 MED ORDER — HYDROXYZINE PAMOATE 50 MG PO CAPS
50.0000 mg | ORAL_CAPSULE | ORAL | Status: DC
  Administered 2011-07-26: 50 mg via ORAL
  Filled 2011-07-25 (×2): qty 1

## 2011-07-25 MED ORDER — LORAZEPAM 1 MG PO TABS: 1.0000 mg | ORAL_TABLET | Freq: Three times a day (TID) | ORAL | Status: DC | PRN

## 2011-07-25 MED ORDER — ZOLPIDEM TARTRATE 5 MG PO TABS: 5.0000 mg | ORAL_TABLET | Freq: Every evening | ORAL | Status: DC | PRN

## 2011-07-25 MED ORDER — ONDANSETRON HCL 4 MG PO TABS: 4.0000 mg | ORAL_TABLET | Freq: Three times a day (TID) | ORAL | Status: DC | PRN

## 2011-07-25 MED ORDER — ACETAMINOPHEN 325 MG PO TABS: 650.0000 mg | ORAL_TABLET | ORAL | Status: DC | PRN

## 2011-07-25 MED ORDER — ALUM & MAG HYDROXIDE-SIMETH 200-200-20 MG/5ML PO SUSP: 30.0000 mL | ORAL | Status: DC | PRN

## 2011-07-25 MED ORDER — CITALOPRAM HYDROBROMIDE 20 MG PO TABS
30.0000 mg | ORAL_TABLET | ORAL | Status: DC
  Administered 2011-07-26: 30 mg via ORAL
  Filled 2011-07-25 (×2): qty 1

## 2011-07-25 MED ORDER — RISPERIDONE 0.5 MG PO TABS: 0.5000 mg | ORAL_TABLET | Freq: Every evening | ORAL | Status: DC

## 2011-07-25 MED ORDER — RISPERIDONE 2 MG PO TABS
2.0000 mg | ORAL_TABLET | ORAL | Status: DC
  Administered 2011-07-26: 2 mg via ORAL
  Filled 2011-07-25: qty 1

## 2011-07-25 MED ORDER — FAMOTIDINE 20 MG PO TABS: 10.0000 mg | ORAL_TABLET | Freq: Every day | ORAL | Status: DC

## 2011-07-25 MED ORDER — NICOTINE 21 MG/24HR TD PT24: 21.0000 mg | MEDICATED_PATCH | Freq: Every day | TRANSDERMAL | Status: DC

## 2011-07-25 MED ORDER — INSULIN ASPART 100 UNIT/ML ~~LOC~~ SOLN: 0.0000 [IU] | Freq: Three times a day (TID) | SUBCUTANEOUS | Status: DC

## 2011-07-25 MED ORDER — ALBUTEROL SULFATE HFA 108 (90 BASE) MCG/ACT IN AERS: 2.0000 | INHALATION_SPRAY | Freq: Four times a day (QID) | RESPIRATORY_TRACT | Status: DC | PRN

## 2011-07-25 NOTE — ED Notes (Signed)
Pt has 2 pt belonging bags locked in locker 40

## 2011-07-25 NOTE — ED Notes (Signed)
Spoke with lab & was informed labs in process, [place], RN informed.

## 2011-07-25 NOTE — ED Provider Notes (Signed)
History     CSN: [id number]  Arrival date & time 07/25/11  1724   First MD Initiated Contact with Patient 07/25/11 1817      Chief Complaint  Patient presents with  . V70.1    (Consider location/radiation/quality/duration/timing/severity/associated sxs/prior treatment) Patient is a 41 y.o. female presenting with mental health disorder. The history is provided by the patient.  Mental Health Problem The current episode started 1 to 2 weeks ago. This is a recurrent problem.  The onset of the illness is precipitated by a stressful event. The degree of incapacity that she is experiencing as a consequence of her illness is moderate. Sequelae of the illness include homelessness. Additional symptoms of the illness include anhedonia, appetite change, fatigue and feelings of worthlessness. Additional symptoms of the illness do not include no headaches or no abdominal pain. She admits to suicidal ideas. She does not have a plan to commit suicide. She contemplates harming herself. She has not already injured self. She does not contemplate injuring another person. She has not already  injured another person. Risk factors that are present for mental illness include a history of mental illness and substance abuse.   Pt with hx of depression presents with increased feelings of depression x the past several weeks, which she attributes to family problems and "having too much on my plate." She has hx of admissions to [facility] for this in the past. Currently takes Risperdal. She additionally has hx of cocaine abuse; she had been clean for 10 months but relapsed 2 days ago and states she has used $500 worth. She had been staying with her mom but states she was kicked out of the home due to her cocaine use and is currently homeless. Denies specific suicidal ideation but does state that she wants to cut her wrists. Denies homicidal ideation.  Past Medical History  Diagnosis Date  . Manic-depressive disorder   . Anxiety    . PTSD (post-traumatic stress disorder)     Past Surgical History  Procedure Date  . Tubal ligation     No family history on file.  History  Substance Use Topics  . Smoking status: Current Everyday Smoker -- 0.5 packs/day  . Smokeless tobacco: Not on file  . Alcohol Use: Yes     ocassionally    OB History    Grav Para Term Preterm Abortions TAB SAB Ect Mult Living                  Review of Systems  Constitutional: Positive for appetite change and fatigue.  Gastrointestinal: Negative for abdominal pain.  Neurological: Negative for headaches.  All other systems reviewed and are negative.    Allergies  Review of patient's allergies indicates no known allergies.  Home Medications   Current Outpatient Rx  Name Route Sig Dispense Refill  . ALBUTEROL SULFATE HFA 108 (90 BASE) MCG/ACT IN AERS Inhalation Inhale 2 puffs into the lungs every 6 (six) hours as needed. For asthma relief    . CITALOPRAM HYDROBROMIDE 10 MG PO TABS Oral Take 30 mg by mouth every morning.    [name] HYDROXYZINE PAMOATE 50 MG PO CAPS Oral Take 50 mg by mouth every morning.    . IBUPROFEN 800 MG PO TABS Oral Take 800 mg by mouth every 8 (eight) hours as needed. For pain    . RANITIDINE HCL 300 MG PO TABS Oral Take 300 mg by mouth at bedtime.    [name] RISPERIDONE 0.5 MG PO TABS Oral Take  0.5 mg by mouth every evening.    [name] RISPERIDONE 1 MG PO TABS Oral Take 2 mg by mouth every morning.      BP 146/94  Pulse 71  Temp(Src) 98.8 F (37.1 C) (Oral)  Resp 16  Wt 162 lb (73.483 kg)  SpO2 99%  LMP 06/29/2011  Physical Exam  Nursing note and vitals reviewed. Constitutional: She is oriented to person, place, and time. She appears well-developed and well-nourished. No distress.  HENT:  Head: Normocephalic and atraumatic.  Right Ear: External ear normal.  Left Ear: External ear normal.  Mouth/Throat: Oropharynx is clear and moist. No oropharyngeal exudate.  Eyes: Conjunctivae and EOM are normal. Pupils are  equal, round, and reactive to light.  Neck: Normal range of motion.  Cardiovascular: Normal rate, regular rhythm and normal heart sounds.   Pulmonary/Chest: Effort normal and breath sounds normal.  Abdominal: Soft. Bowel sounds are normal. There is no tenderness.  Musculoskeletal: Normal range of motion.  Neurological: She is alert and oriented to person, place, and time. No cranial nerve deficit. Coordination normal.  Skin: Skin is warm and dry. She is not diaphoretic.  Psychiatric: Her speech is normal. Her affect is blunt. She is withdrawn. She is not agitated. Thought content is not paranoid. She exhibits a depressed mood. She expresses suicidal ideation. She expresses no homicidal ideation. She expresses no suicidal plans.    ED Course  Procedures (including critical care time)  Labs Reviewed  URINE RAPID DRUG SCREEN (HOSP PERFORMED) - Abnormal; Notable for the following:    Cocaine POSITIVE (*)    Tetrahydrocannabinol POSITIVE (*)    All other components within normal limits  CBC - Abnormal; Notable for the following:    WBC 12.6 (*)    All other components within normal limits  DIFFERENTIAL - Abnormal; Notable for the following:    Neutro Abs 8.7 (*)    All other components within normal limits  COMPREHENSIVE METABOLIC PANEL - Abnormal; Notable for the following:    Total Bilirubin 0.2 (*)    All other components within normal limits  URINALYSIS, ROUTINE W REFLEX MICROSCOPIC - Abnormal; Notable for the following:    Bilirubin Urine SMALL (*)    Ketones, ur >80 (*)    Leukocytes, UA TRACE (*)    All other components within normal limits  GLUCOSE, CAPILLARY  ETHANOL  POCT PREGNANCY, URINE  URINE MICROSCOPIC-ADD ON   No results found.   No diagnosis found.    MDM  Pt medically clear to move to psych ED. ACT aware and will see.        Grant Fontana, [place] 07/25/11 2226

## 2011-07-25 NOTE — BH Assessment (Signed)
Assessment Note   [name] is an 41 y.o. female. Pt reported to the [facility] with a chief complaint of depression and suicidal ideation. Pt states that she has had increased depression over the last month due to conflict and constant negativity from her mother, recent release from prison [employer] 2012), and coping with the loss of her brother in Sept. 2012. Pt reports her depressive symptoms include: increased sleep (8+ hours daily with multiple naps), anger/irritability, fatigue and loss of interest in usual pleasures.Pt states she has felt suicidal with a plan to cut her wrists though is unsure if she has intent at the current time. Pt reports a hx of manic depressive disorder, anxiety disorder, and PTSD. Pt currently endorses SI with plan and depression. Pt denies HI/AVH.  Pt reports she was recently released from [facility] in November 2012 (after serving 10 months for possession with intent to sell) and was prescribed medication there. Pt states she currently seeks treatment at [facility] though they have not prescribed any new medications at this time. Pt is currently on probation and lives with her mother who pt reports is an alcoholic.  Pt reports a history of substance abuse and a recent relapse after being sober for 10 months. Pt last used 2 days ago which included: Crack Cocaine- $300-400 worth, THC-"couple tokes" and ETOH- 2-3 glasses of beer and liquor. Pt states she is further depressed due to the recent relapse.   Pt is currently pending a tele-psych for disposition.   Axis I: Depressive Disorder NOS and Substance Abuse Axis II: Deferred Axis III:  Past Medical History  Diagnosis Date  . Manic-depressive disorder   . Anxiety   . PTSD (post-traumatic stress disorder)    Axis IV: housing problems, problems related to social environment and problems with primary support group Axis V: 41-50 serious symptoms  Past Medical History:    Past Medical History  Diagnosis Date  . Manic-depressive disorder   . Anxiety   . PTSD (post-traumatic stress disorder)     Past Surgical History  Procedure Date  . Tubal ligation     Family History: No family history on file.  Social History:  reports that she has been smoking.  She does not have any smokeless tobacco history on file. She reports that she drinks alcohol. She reports that she uses illicit drugs.  Additional Social History:    Allergies: No Known Allergies  Home Medications:  Medications Prior to Admission  Medication Dose Route Frequency Provider Last Rate Last Dose  . acetaminophen (TYLENOL) tablet 650 mg  650 mg Oral Q4H PRN Grant Fontana, PA      . albuterol (PROVENTIL HFA;VENTOLIN HFA) 108 (90 BASE) MCG/ACT inhaler 2 puff  2 puff Inhalation Q6H PRN Grant Fontana, PA      . alum & mag hydroxide-simeth (MAALOX/MYLANTA) 200-200-20 MG/5ML suspension 30 mL  30 mL Oral PRN Grant Fontana, PA      . citalopram (CELEXA) tablet 30 mg  30 mg Oral Q0700 Grant Fontana, [place]      . famotidine (PEPCID) tablet 10 mg  10 mg Oral Daily Grant Fontana, [place]      . hydrOXYzine (VISTARIL) capsule 50 mg  50 mg Oral Q0700 Grant Fontana, [place]      . insulin aspart (novoLOG) injection 0-9 Units  0-9 Units Subcutaneous TID WC Grant Fontana, [place]      . LORazepam (ATIVAN) tablet 1 mg  1 mg Oral Q8H PRN Grant Fontana,  PA      . nicotine (NICODERM CQ - dosed in mg/24 hours) patch 21 mg  21 mg Transdermal Daily Grant Fontana, [place]      . ondansetron [facility]) tablet 4 mg  4 mg Oral Q8H PRN Grant Fontana, PA      . risperiDONE (RISPERDAL) tablet 0.5 mg  0.5 mg Oral QPM Grant Fontana, PA      . risperiDONE (RISPERDAL) tablet 2 mg  2 mg Oral Q0700 Grant Fontana, [place]      . zolpidem [facility]) tablet 5 mg  5 mg Oral QHS PRN Grant Fontana, PA       No current outpatient prescriptions on file as of 07/25/2011.    OB/GYN Status:  Patient's  last menstrual period was 06/29/2011.  General Assessment Data Location of Assessment: [facility] Living Arrangements: Parent (mother) Can pt return to current living arrangement?: No (pt is unsure-currently working w/probation Technical sales engineer on housin) Admission Status: Voluntary Is patient capable of signing voluntary admission?: Yes Transfer from: Acute Hospital Referral Source: Self/Family/Friend  Education Status Is patient currently in school?: No  Risk to self Suicidal Ideation: Yes-Currently Present Suicidal Intent: No-Not Currently/Within Last 6 Months Is patient at risk for suicide?: Yes Suicidal Plan?: Yes-Currently Present Specify Current Suicidal Plan: cut wrists Access to Means: Yes (knives in the home) Specify Access to Suicidal Means: has knives in the home What has been your use of drugs/alcohol within the last 12 months?: Crack Cocaine, Alcohol, THC Previous Attempts/Gestures: Yes How many times?: 2  Other Self Harm Risks: none noted Triggers for Past Attempts: Other (Comment) (depression) Intentional Self Injurious Behavior: None Family Suicide History: No Recent stressful life event(s): Other (Comment);Conflict (Comment);Legal Issues (released from prision in 11/12, conflict with mother, relaps) Persecutory voices/beliefs?: No Depression: Yes Depression Symptoms: Tearfulness;Isolating;Fatigue;Guilt;Feeling angry/irritable;Loss of interest in usual pleasures Substance abuse history and/or treatment for substance abuse?: Yes Suicide prevention information given to non-admitted patients: Not applicable  Risk to Others Homicidal Ideation: No-Not Currently/Within Last 6 Months Thoughts of Harm to Others: No-Not Currently Present/Within Last 6 Months Current Homicidal Intent: No-Not Currently/Within Last 6 Months Current Homicidal Plan: No-Not Currently/Within Last 6 Months Access to Homicidal Means: No Identified Victim: none History of harm to others?: No Assessment of  Violence: None Noted Violent Behavior Description: pt calm and cooperative during assessment Does patient have access to weapons?: No Criminal Charges Pending?: No Does patient have a court date: No  Psychosis Hallucinations: None noted Delusions: None noted  Mental Status Report Appear/Hygiene:  (appropriate to circumstances) Eye Contact: Fair Motor Activity: Unremarkable Speech: Logical/coherent;Soft Level of Consciousness: Quiet/awake;Drowsy Mood: Depressed Affect: Blunted;Depressed Anxiety Level: None Thought Processes: Coherent;Relevant Judgement: Impaired Orientation: Person;Place;Time;Situation Obsessive Compulsive Thoughts/Behaviors: None  Cognitive Functioning Concentration: Normal Memory: Recent Intact;Remote Intact IQ: Average Insight: Fair Impulse Control: Poor Appetite: Fair Weight Loss: 0  Weight Gain: 0  Sleep: Increased Total Hours of Sleep:  (over 8 hours daily; multiple naps throughout day for 1 month) Vegetative Symptoms: None  Prior Inpatient Therapy Prior Inpatient Therapy: Yes Prior Therapy Dates: 1998, 2002, 2003, 2004 Prior Therapy Facilty/Provider(s): [facility], [facility],  (pt states other hospitals but is unable to remember names) Reason for Treatment: depression, SA, SI, medication management  Prior Outpatient Therapy Prior Outpatient Therapy: Yes Prior Therapy Dates: Nov. 2012-Present Prior Therapy Facilty/Provider(s): [facility] Reason for Treatment: depression, anxiety, PTSD            Values / Beliefs Cultural Requests During Hospitalization: None Spiritual Requests During Hospitalization:  None        Additional Information 1:1 In Past 12 Months?: No CIRT Risk: No Elopement Risk: No Does patient have medical clearance?: Yes     Disposition:  Disposition Disposition of Patient: Other dispositions (pt is currently pending tele-psych consult) Other disposition(s): Other (Comment) (pending tele-psych  consult)  On Site Evaluation by:   Reviewed with Physician:     [place] Crane F 07/25/2011 11:00 PM

## 2011-07-25 NOTE — ED Notes (Signed)
Pt is currently pending a tele-psych consult for disposition.  

## 2011-07-25 NOTE — ED Notes (Signed)
Pt states "depressed & want to kill myself, I will cut my wrists, that's what I usually do, I've relapsed from crack cocaine"

## 2011-07-26 LAB — GLUCOSE, CAPILLARY: Glucose-Capillary: 143 mg/dL — ABNORMAL HIGH (ref 70–99)

## 2011-07-26 NOTE — ED Provider Notes (Signed)
Medical screening examination/treatment/procedure(s) were conducted as a shared visit with non-physician practitioner(s) and myself.  I personally evaluated the patient during the encounter.  Findings of depression and desire to herself. Will get psych consult  Donnetta Hutching, MD 07/26/11 [phone]

## 2011-07-26 NOTE — ED Notes (Signed)
Pt has been cleared by psych and discharged by EDP. Pt states she is unable to leave until her probation officer comes to pick her up. Message left on P.O's cell phone per pt request.  MD aware, ACT aware.

## 2013-08-25 ENCOUNTER — Emergency Department (HOSPITAL_COMMUNITY)
Admission: EM | Admit: 2013-08-25 | Discharge: 2013-08-26 | Disposition: A | Discharge status: Home / Self Care | Payer: Medicare Other | Attending: Emergency Medicine | Admitting: Emergency Medicine

## 2013-08-25 ENCOUNTER — Encounter (HOSPITAL_COMMUNITY): Payer: Self-pay | Admitting: Emergency Medicine

## 2013-08-25 DIAGNOSIS — S62639A Displaced fracture of distal phalanx of unspecified finger, initial encounter for closed fracture: Secondary | ICD-10-CM | POA: Insufficient documentation

## 2013-08-25 DIAGNOSIS — F411 Generalized anxiety disorder: Secondary | ICD-10-CM | POA: Insufficient documentation

## 2013-08-25 DIAGNOSIS — S20229A Contusion of unspecified back wall of thorax, initial encounter: Secondary | ICD-10-CM | POA: Insufficient documentation

## 2013-08-25 DIAGNOSIS — F17200 Nicotine dependence, unspecified, uncomplicated: Secondary | ICD-10-CM | POA: Insufficient documentation

## 2013-08-25 DIAGNOSIS — S298XXA Other specified injuries of thorax, initial encounter: Secondary | ICD-10-CM | POA: Insufficient documentation

## 2013-08-25 DIAGNOSIS — I10 Essential (primary) hypertension: Secondary | ICD-10-CM | POA: Insufficient documentation

## 2013-08-25 DIAGNOSIS — F419 Anxiety disorder, unspecified: Secondary | ICD-10-CM

## 2013-08-25 DIAGNOSIS — J45909 Unspecified asthma, uncomplicated: Secondary | ICD-10-CM | POA: Insufficient documentation

## 2013-08-25 DIAGNOSIS — Z8719 Personal history of other diseases of the digestive system: Secondary | ICD-10-CM | POA: Insufficient documentation

## 2013-08-25 HISTORY — DX: Unspecified asthma, uncomplicated: J45.909

## 2013-08-25 HISTORY — DX: Essential (primary) hypertension: I10

## 2013-08-25 HISTORY — DX: Gastro-esophageal reflux disease without esophagitis: K21.9

## 2013-08-25 NOTE — ED Notes (Signed)
Patient transported to X-ray 
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
## 2013-08-25 NOTE — ED Notes (Signed)
Patient reports she did not take anything for pain.
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
## 2013-08-26 ENCOUNTER — Emergency Department (HOSPITAL_COMMUNITY): Payer: Medicare Other

## 2013-08-26 MED ORDER — HYDROCODONE-ACETAMINOPHEN 5-325 MG PO TABS: 1.0000 | ORAL_TABLET | ORAL | Status: DC | PRN

## 2013-08-26 NOTE — Discharge Instructions (Signed)
Take vicodin as prescribed for severe pain.  Do not drive within four hours of taking this medication (may cause drowsiness or confusion).   Take ibuprofen as well; up to 800mg  three times a day with food.  Apply ice to finger and ice or heat to right side of back 2-3 times a day for 15-20 minutes.  Elevate right hand as often as possible.  Follow up with [facility] for your anxiety and the Hand surgeon for your finger fracture.   Return to the ER if you have worsening back pain or difficulty breathing.

## 2013-08-26 NOTE — ED Provider Notes (Signed)
Medical screening examination/treatment/procedure(s) were performed by non-physician practitioner and as supervising physician I was immediately available for consultation/collaboration.   EKG Interpretation None        Neta Ehlers, MD 08/26/13 [phone]

## 2013-10-19 ENCOUNTER — Emergency Department (HOSPITAL_COMMUNITY)
Admission: EM | Admit: 2013-10-19 | Discharge: 2013-10-20 | Disposition: A | Discharge status: Other Acute Inpatient Hospital | Payer: Medicare Other | Attending: Emergency Medicine | Admitting: Emergency Medicine

## 2013-10-19 ENCOUNTER — Encounter (HOSPITAL_COMMUNITY): Payer: Self-pay | Admitting: Emergency Medicine

## 2013-10-19 DIAGNOSIS — Z3202 Encounter for pregnancy test, result negative: Secondary | ICD-10-CM | POA: Insufficient documentation

## 2013-10-19 DIAGNOSIS — F17200 Nicotine dependence, unspecified, uncomplicated: Secondary | ICD-10-CM | POA: Insufficient documentation

## 2013-10-19 DIAGNOSIS — A499 Bacterial infection, unspecified: Secondary | ICD-10-CM | POA: Insufficient documentation

## 2013-10-19 DIAGNOSIS — I10 Essential (primary) hypertension: Secondary | ICD-10-CM | POA: Insufficient documentation

## 2013-10-19 DIAGNOSIS — F3010 Manic episode without psychotic symptoms, unspecified: Secondary | ICD-10-CM

## 2013-10-19 DIAGNOSIS — F309 Manic episode, unspecified: Secondary | ICD-10-CM | POA: Insufficient documentation

## 2013-10-19 DIAGNOSIS — Z8719 Personal history of other diseases of the digestive system: Secondary | ICD-10-CM | POA: Insufficient documentation

## 2013-10-19 DIAGNOSIS — F411 Generalized anxiety disorder: Secondary | ICD-10-CM | POA: Insufficient documentation

## 2013-10-19 DIAGNOSIS — F1410 Cocaine abuse, uncomplicated: Secondary | ICD-10-CM | POA: Insufficient documentation

## 2013-10-19 DIAGNOSIS — F1910 Other psychoactive substance abuse, uncomplicated: Secondary | ICD-10-CM | POA: Diagnosis present

## 2013-10-19 DIAGNOSIS — N760 Acute vaginitis: Secondary | ICD-10-CM | POA: Insufficient documentation

## 2013-10-19 DIAGNOSIS — Z79899 Other long term (current) drug therapy: Secondary | ICD-10-CM | POA: Insufficient documentation

## 2013-10-19 DIAGNOSIS — R45851 Suicidal ideations: Secondary | ICD-10-CM | POA: Insufficient documentation

## 2013-10-19 DIAGNOSIS — F319 Bipolar disorder, unspecified: Secondary | ICD-10-CM | POA: Diagnosis present

## 2013-10-19 DIAGNOSIS — B9689 Other specified bacterial agents as the cause of diseases classified elsewhere: Secondary | ICD-10-CM | POA: Insufficient documentation

## 2013-10-19 DIAGNOSIS — J45909 Unspecified asthma, uncomplicated: Secondary | ICD-10-CM | POA: Insufficient documentation

## 2013-10-19 LAB — WET PREP, GENITAL
Trich, Wet Prep: NONE SEEN
WBC, Wet Prep HPF POC: NONE SEEN
Yeast Wet Prep HPF POC: NONE SEEN

## 2013-10-19 LAB — CBC
HEMATOCRIT: 40 % (ref 36.0–46.0)
HEMOGLOBIN: 13.3 g/dL (ref 12.0–15.0)
MCH: 29.4 pg (ref 26.0–34.0)
MCHC: 33.3 g/dL (ref 30.0–36.0)
MCV: 88.5 fL (ref 78.0–100.0)
PLATELETS: 259 10*3/uL (ref 150–400)
RBC: 4.52 MIL/uL (ref 3.87–5.11)
RDW: 15.3 % (ref 11.5–15.5)
WBC: 8.1 10*3/uL (ref 4.0–10.5)

## 2013-10-19 LAB — SALICYLATE LEVEL

## 2013-10-19 LAB — COMPREHENSIVE METABOLIC PANEL
ALT: 12 U/L (ref 0–35)
AST: 15 U/L (ref 0–37)
Albumin: 3.1 g/dL — ABNORMAL LOW (ref 3.5–5.2)
Alkaline Phosphatase: 76 U/L (ref 39–117)
BUN: 10 mg/dL (ref 6–23)
CHLORIDE: 101 meq/L (ref 96–112)
CO2: 25 meq/L (ref 19–32)
CREATININE: 0.63 mg/dL (ref 0.50–1.10)
Calcium: 9.2 mg/dL (ref 8.4–10.5)
GLUCOSE: 84 mg/dL (ref 70–99)
Potassium: 4.2 mEq/L (ref 3.7–5.3)
Sodium: 138 mEq/L (ref 137–147)
Total Protein: 6.7 g/dL (ref 6.0–8.3)

## 2013-10-19 LAB — HIV ANTIBODY (ROUTINE TESTING W REFLEX): HIV: NONREACTIVE

## 2013-10-19 LAB — RAPID URINE DRUG SCREEN, HOSP PERFORMED
AMPHETAMINES: NOT DETECTED
Barbiturates: NOT DETECTED
Benzodiazepines: NOT DETECTED
Cocaine: POSITIVE — AB
Opiates: NOT DETECTED
TETRAHYDROCANNABINOL: NOT DETECTED

## 2013-10-19 LAB — ACETAMINOPHEN LEVEL: Acetaminophen (Tylenol), Serum: <15 ug/mL (ref 10–30)

## 2013-10-19 LAB — PREGNANCY, URINE: PREG TEST UR: NEGATIVE

## 2013-10-19 LAB — ETHANOL: Alcohol, Ethyl (B): <11 mg/dL (ref 0–11)

## 2013-10-19 MED ORDER — GUAIFENESIN 100 MG/5ML PO SOLN
5.0000 mL | Freq: Once | ORAL | Status: AC
  Administered 2013-10-19: 100 mg via ORAL
  Filled 2013-10-19: qty 5

## 2013-10-19 MED ORDER — ALBUTEROL SULFATE HFA 108 (90 BASE) MCG/ACT IN AERS
2.0000 | INHALATION_SPRAY | Freq: Once | RESPIRATORY_TRACT | Status: AC
  Administered 2013-10-19: 2 via RESPIRATORY_TRACT
  Filled 2013-10-19: qty 6.7

## 2013-10-19 MED ORDER — METRONIDAZOLE 500 MG PO TABS
500.0000 mg | ORAL_TABLET | Freq: Two times a day (BID) | ORAL | Status: DC
  Administered 2013-10-19 – 2013-10-20 (×3): 500 mg via ORAL
  Filled 2013-10-19 (×3): qty 1

## 2013-10-19 MED ORDER — IBUPROFEN 200 MG PO TABS: 600.0000 mg | ORAL_TABLET | Freq: Three times a day (TID) | ORAL | Status: DC | PRN

## 2013-10-19 MED ORDER — AZITHROMYCIN 1 G PO PACK
1.0000 g | PACK | Freq: Once | ORAL | Status: AC
  Administered 2013-10-19: 1 g via ORAL
  Filled 2013-10-19: qty 1

## 2013-10-19 MED ORDER — ONDANSETRON HCL 4 MG PO TABS: 4.0000 mg | ORAL_TABLET | Freq: Three times a day (TID) | ORAL | Status: DC | PRN

## 2013-10-19 MED ORDER — LORAZEPAM 1 MG PO TABS
1.0000 mg | ORAL_TABLET | Freq: Three times a day (TID) | ORAL | Status: DC | PRN
  Administered 2013-10-19 – 2013-10-20 (×2): 1 mg via ORAL
  Filled 2013-10-19 (×2): qty 1

## 2013-10-19 MED ORDER — LIDOCAINE HCL 1 % IJ SOLN
INTRAMUSCULAR | Status: AC
  Administered 2013-10-19: 15:00:00
  Filled 2013-10-19: qty 20

## 2013-10-19 MED ORDER — ALUM & MAG HYDROXIDE-SIMETH 200-200-20 MG/5ML PO SUSP: 30.0000 mL | ORAL | Status: DC | PRN

## 2013-10-19 MED ORDER — ACETAMINOPHEN 325 MG PO TABS: 650.0000 mg | ORAL_TABLET | ORAL | Status: DC | PRN

## 2013-10-19 MED ORDER — CEFTRIAXONE SODIUM 250 MG IJ SOLR
250.0000 mg | Freq: Once | INTRAMUSCULAR | Status: AC
  Administered 2013-10-19: 250 mg via INTRAMUSCULAR
  Filled 2013-10-19: qty 250
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
## 2013-10-19 NOTE — ED Notes (Signed)
Report called to Sharyn Lull, RN for transfer to Rm 40 in psych ED

## 2013-10-19 NOTE — Progress Notes (Addendum)
Received phone call from [facility] stating that Dr. Arnette Norris has accepted pt. Pending IVC and can come after 6 am Mon 4.26.15.  Report number is [phone].  Will call and update [facility] ED.  Please fax copy of IVC to [phone] [facility]) once complete.    Rick Duff Disposition MHT

## 2013-10-19 NOTE — ED Notes (Signed)
Denies SI, will continue to monitor for safety. 

## 2013-10-19 NOTE — ED Notes (Signed)
Per pt, she is manic. States that she has been out meds for 3 months.  Pt having depression with thoughts of suicide.  Pt states specific plan would be to cut wrist. Pt having no HI.  Unable to get to [facility] to get her meds.

## 2013-10-19 NOTE — ED Provider Notes (Signed)
CSN: [id number]     Arrival date & time 10/19/13  1152 History   First MD Initiated Contact with Patient 10/19/13 1335     Chief Complaint  Patient presents with  . Medical Clearance     (Consider location/radiation/quality/duration/timing/severity/associated sxs/prior Treatment) HPI Comments: This is a G66 P70 43 year old female past medical history significant for manic depressive disorder or anxiety, PTSD, hypertension, acid reflux, asthma presenting to the emergency department for one month of manic phase with associated increased anxiety, suicidal ideation with plan to cut her wrists. Denying any homicidal ideations, self injury. She does endorse visual flashbacks, alcohol and recreational drug use. Patient states she is concerned about possible sexually transmitted disease due to promiscuity during her manic phase. She states she has had purulent vaginal discharge over the last few days. Last menstrual period was on April 11.   Past Medical History  Diagnosis Date  . Manic-depressive disorder   . Anxiety   . PTSD (post-traumatic stress disorder)   . Hypertension   . Asthma   . Acid reflux    Past Surgical History  Procedure Laterality Date  . Tubal ligation     History reviewed. No pertinent family history. History  Substance Use Topics  . Smoking status: Current Every Day Smoker -- 0.50 packs/day  . Smokeless tobacco: Not on file  . Alcohol Use: Yes     Comment: ocassionally   OB History   Grav Para Term Preterm Abortions TAB SAB Ect Mult Living                 Review of Systems  Constitutional: Negative for fever and chills.  Respiratory: Positive for cough. Negative for shortness of breath.   Cardiovascular: Negative for chest pain.  Gastrointestinal: Negative for nausea and vomiting.  Genitourinary: Positive for vaginal discharge. Negative for vaginal bleeding and vaginal pain.  All other systems reviewed and are negative.     Allergies  Review of patient's  allergies indicates no known allergies.  Home Medications   Prior to Admission medications   Medication Sig Start Date End Date Taking? Authorizing Provider  albuterol (PROVENTIL HFA;VENTOLIN HFA) 108 (90 BASE) MCG/ACT inhaler Inhale 2 puffs into the lungs every 6 (six) hours as needed. For asthma relief   Yes Historical Provider, MD  CARBAMAZEPINE PO Take by mouth 3 (three) times daily. For mood stabilization.   Yes Historical Provider, MD  FLUoxetine (PROZAC) 20 MG capsule Take 20 mg by mouth 3 (three) times daily.   Yes Historical Provider, MD  HYDROcodone-acetaminophen (NORCO/VICODIN) 5-325 MG per tablet Take 1 tablet by mouth every 4 (four) hours as needed for moderate pain. 08/26/13  Yes Catherine E Schinlever, PA-C  ibuprofen (ADVIL,MOTRIN) 800 MG tablet Take 800 mg by mouth every 8 (eight) hours as needed. For pain   Yes Historical Provider, MD  OVER THE COUNTER MEDICATION Take 30 mLs by mouth 4 (four) times daily as needed (cough).   Yes Historical Provider, MD   BP 133/83  Pulse 93  Temp(Src) 97.9 F (36.6 C) (Oral)  Resp 16  SpO2 100%  LMP 10/03/2013 Physical Exam  Nursing note and vitals reviewed. Constitutional: She is oriented to person, place, and time. She appears well-developed and well-nourished. No distress.  HENT:  Head: Normocephalic and atraumatic.  Right Ear: External ear normal.  Left Ear: External ear normal.  Nose: Nose normal.  Mouth/Throat: Oropharynx is clear and moist.  Eyes: Conjunctivae are normal.  Neck: Normal range of motion. Neck supple.  Cardiovascular: Normal rate, regular rhythm and normal heart sounds.   Pulmonary/Chest: Effort normal and breath sounds normal. No respiratory distress.  Cough appreciated on examination  Abdominal: Soft. There is no tenderness.  Musculoskeletal: Normal range of motion.  Neurological: She is alert and oriented to person, place, and time.  Skin: Skin is warm and dry. She is not diaphoretic.  Psychiatric: She has  a normal mood and affect.   Exam performed by Harlow Mares,  exam chaperoned Date: 10/19/2013 Pelvic exam: normal external genitalia without evidence of trauma. VULVA: normal appearing vulva with no masses, tenderness or lesion. VAGINA: normal appearing vagina with normal color and discharge, no lesions. CERVIX: normal appearing cervix without lesions, cervical motion tenderness absent, cervical os closed with out purulent discharge; vaginal discharge - white, copious and creamy, Wet prep and DNA probe for chlamydia and GC obtained.   ADNEXA: normal adnexa in size, nontender and no masses UTERUS: uterus is normal size, shape, consistency and nontender.   ED Course  Procedures (including critical care time) Medications  acetaminophen (TYLENOL) tablet 650 mg (not administered)  ibuprofen (ADVIL,MOTRIN) tablet 600 mg (not administered)  LORazepam (ATIVAN) tablet 1 mg (not administered)  ondansetron (ZOFRAN) tablet 4 mg (not administered)  alum & mag hydroxide-simeth (MAALOX/MYLANTA) 200-200-20 MG/5ML suspension 30 mL (not administered)  metroNIDAZOLE (FLAGYL) tablet 500 mg (500 mg Oral Given 10/19/13 1542)  guaiFENesin (ROBITUSSIN) 100 MG/5ML solution 100 mg (100 mg Oral Given 10/19/13 1416)  albuterol (PROVENTIL HFA;VENTOLIN HFA) 108 (90 BASE) MCG/ACT inhaler 2 puff (2 puffs Inhalation Given 10/19/13 1406)  cefTRIAXone (ROCEPHIN) injection 250 mg (250 mg Intramuscular Given 10/19/13 1524)  azithromycin (ZITHROMAX) powder 1 g (1 g Oral Given 10/19/13 1524)  lidocaine (XYLOCAINE) 1 % (with pres) injection (  Given 10/19/13 1529)    Labs Review Labs Reviewed  WET PREP, GENITAL - Abnormal; Notable for the following:    Clue Cells Wet Prep HPF POC FEW (*)    All other components within normal limits  COMPREHENSIVE METABOLIC PANEL - Abnormal; Notable for the following:    Albumin 3.1 (*)    Total Bilirubin <0.2 (*)    All other components within normal limits  SALICYLATE LEVEL -  Abnormal; Notable for the following:    Salicylate Lvl <8.0 (*)    All other components within normal limits  URINE RAPID DRUG SCREEN (HOSP PERFORMED) - Abnormal; Notable for the following:    Cocaine POSITIVE (*)    All other components within normal limits  GC/CHLAMYDIA PROBE AMP  ACETAMINOPHEN LEVEL  CBC  ETHANOL  PREGNANCY, URINE  HIV ANTIBODY (ROUTINE TESTING)    Imaging Review No results found.   EKG Interpretation None      MDM   Final diagnoses:  Suicidal ideation  Manic behavior  Bacterial vaginosis    Filed Vitals:   10/19/13 1202  BP: 133/83  Pulse: 93  Temp: 97.9 F (36.6 C)  Resp: 16   Afebrile, NAD, non-toxic appearing, AAOx4.   1) Suicidal ideations: Patient presents to the ER with a number of risk factors for suicide for example the patient has a history of manic-depressive disorder, PTSD, past suicide attempts, self injury. Current Plan is to have patient be evaluated by TTS for further assessment on whether or not to be placed inpatient.  Patient has been cleared to move to [facility] pending further assessment.   2) Bacterial vaginosis: Pelvic exam with white vaginal discharge no CMT or adnexal fullness or tenderness. GC/Chlamydia cultures pending and  that they will need to inform all sexual partners within the last 6 months if results return positive. Pt has been treated prophylacticly with azithromycin and rocephin due to pts history, pelvic exam, and wet. Flagyl ordered to treat for bacterial vaginosis. Pt advised that she will receive a call in 48 hours if the test is positive and to refrain from sexual activity for 48 hours. If the test is positive, pt is advised to refrain from sexual activity for 10 days for the medicine to take effect.  Pt not concerning for PID because hemodynamically stable and no cervical motion tenderness on pelvic exam. Pt has also been treated with flagyl for Bacterial Vaginosis. Pt has been advised to not drink alcohol while on  this medication.   TTS consult pending at time of shift change.          Harlow Mares, PA-C 10/19/13 1550

## 2013-10-19 NOTE — BH Assessment (Signed)
Assessment Note  [name] is an 43 y.o. female.   What Led to ER Visit:  Pt presents in ED for suicidal thoughts and plan and intent to harm self via cutting.  Pt has prior hx of cutting in suicide attempts and 3 inpatient hospitalization placements due to attempts.  Pt has long hx of Bi-Polar and enduring manic behaviors as a result of being off medications.  Pt confirms she has been off meds for 3+months and is seen by [facility] in [place].  Lives in [place] now.  Pt reports stressor is a recent court verdict as well as life stressors.  Pt is cooperative and polite and reports she wants help to get her medications straight and to "stop feeling like I want to hurt myself."  Pt uses cocaine and alcohol and THC on occasion.    Pt also concerned about STD and is being evaluated for STD  Pt reports not being able to sleep more than an hour per night and is not eating well.  Pt reports hx of hearing voices and "sometimes hears voices or sounds but it's related to me not on my medication."  Pt reports hx of Prozac and Seroquel use but can't remember other meds  Recommendation  psychiatry will round on pt and TTS recommends inptx due to safety issues and past hx of attempts.  Axis I: Bipolar, Manic, Generalized Anxiety Disorder, Major Depression, Recurrent severe and Post Traumatic Stress Disorder Axis II: Deferred Axis III:  Past Medical History  Diagnosis Date  . Manic-depressive disorder   . Anxiety   . PTSD (post-traumatic stress disorder)   . Hypertension   . Asthma   . Acid reflux    Axis IV: other psychosocial or environmental problems, problems related to social environment and problems with primary support group Axis V: 41-50 serious symptoms  Past Medical History:  Past Medical History  Diagnosis Date  . Manic-depressive disorder   . Anxiety   . PTSD (post-traumatic stress disorder)   . Hypertension   . Asthma   . Acid reflux     Past Surgical  History  Procedure Laterality Date  . Tubal ligation      Family History: History reviewed. No pertinent family history.  Social History:  reports that she has been smoking.  She does not have any smokeless tobacco history on file. She reports that she drinks alcohol. She reports that she does not use illicit drugs.  Additional Social History:  Alcohol / Drug Use Pain Medications: na Prescriptions: na Over the Counter: na History of alcohol / drug use?: Yes Longest period of sobriety (when/how long): 2 years Negative Consequences of Use: Personal relationships;Work / Financial trader Substance #1 Name of Substance 1: alcohol 1 - Age of First Use: teen 1 - Amount (size/oz): varies 1 - Frequency: 2 or 3x per week - depends 1 - Duration: off and on 1 - Last Use / Amount: 10-18-13 Substance #2 Name of Substance 2: cocaine 2 - Age of First Use: 20s 2 - Amount (size/oz): varies 2 - Frequency: varies 2 - Duration: periodic 2 - Last Use / Amount: 09-24-13  CIWA: CIWA-Ar BP: 133/83 mmHg Pulse Rate: 93 COWS:    Allergies: No Known Allergies  Home Medications:  (Not in a hospital admission)  OB/GYN Status:  Patient's last menstrual period was 10/03/2013.  General Assessment Data Location of Assessment: [facility] Is this a Tele or Face-to-Face Assessment?: Face-to-Face Is this an Initial Assessment or a Re-assessment for  this encounter?: Initial Assessment Living Arrangements: Parent Can pt return to current living arrangement?: Yes Admission Status: Voluntary Is patient capable of signing voluntary admission?: Yes Transfer from: [facility] Referral Source: MD  Medical Screening Exam ([place]) Medical Exam completed: Yes  [place] Living Arrangements: Parent Name of Psychiatrist: [facility] in [place] Name of Therapist: na  Education Status Is patient currently in school?: No Current Grade: na Highest grade of school patient has completed: na Name of  school: na Contact person: na  Risk to self Suicidal Ideation: Yes-Currently Present Suicidal Intent: Yes-Currently Present Is patient at risk for suicide?: Yes Suicidal Plan?: Yes-Currently Present Specify Current Suicidal Plan: cut wrist Access to Means: Yes Specify Access to Suicidal Means: can get a knife What has been your use of drugs/alcohol within the last 12 months?: cocaine, thc and alcohol Previous Attempts/Gestures: Yes How many times?: 3 Other Self Harm Risks: na Triggers for Past Attempts: Anniversary;Unpredictable Intentional Self Injurious Behavior: Cutting Comment - Self Injurious Behavior: cutting Family Suicide History: No Recent stressful life event(s): Conflict (Comment) Persecutory voices/beliefs?: No Depression: Yes Depression Symptoms: Tearfulness;Isolating;Fatigue;Guilt;Loss of interest in usual pleasures;Feeling worthless/self pity;Feeling angry/irritable Substance abuse history and/or treatment for substance abuse?: Yes Suicide prevention information given to non-admitted patients: Not applicable  Risk to Others Homicidal Ideation: No Thoughts of Harm to Others: No Current Homicidal Intent: No Current Homicidal Plan: No Access to Homicidal Means: No Identified Victim: na History of harm to others?: No Assessment of Violence: None Noted Violent Behavior Description: cooperative Does patient have access to weapons?: No Criminal Charges Pending?: No Does patient have a court date: No  Psychosis Hallucinations: Auditory Delusions: Unspecified  Mental Status Report Appear/Hygiene: Disheveled Eye Contact: Good Motor Activity: Restlessness Speech: Logical/coherent Level of Consciousness: Alert Mood: Depressed;Anxious;Worthless, low self-esteem;Sad Affect: Anxious;Depressed;Sad Anxiety Level: Severe Thought Processes: Coherent Judgement: Impaired Orientation: Person;Place;Situation Obsessive Compulsive Thoughts/Behaviors: None  Cognitive  Functioning Concentration: Decreased Memory: Recent Intact;Remote Intact IQ: Average Insight: Fair Impulse Control: Poor Appetite: Fair Weight Loss: 0 Weight Gain: 0 Sleep: Decreased Total Hours of Sleep: 3 Vegetative Symptoms: None  ADLScreening [facility] Assessment Services) Patient's cognitive ability adequate to safely complete daily activities?: Yes Patient able to express need for assistance with ADLs?: Yes Independently performs ADLs?: Yes (appropriate for developmental age)  Prior Inpatient Therapy Prior Inpatient Therapy: Yes Prior Therapy Dates: 2014 Prior Therapy Facilty/Provider(s): [place] and [place] Reason for Treatment: si  Prior Outpatient Therapy Prior Outpatient Therapy: Yes Prior Therapy Dates: 2014 Prior Therapy Facilty/Provider(s): [facility] Reason for Treatment: med mgt  ADL Screening (condition at time of admission) Patient's cognitive ability adequate to safely complete daily activities?: Yes Is the patient deaf or have difficulty hearing?: No Does the patient have difficulty seeing, even when wearing glasses/contacts?: No Does the patient have difficulty concentrating, remembering, or making decisions?: No Patient able to express need for assistance with ADLs?: Yes Does the patient have difficulty dressing or bathing?: No Independently performs ADLs?: Yes (appropriate for developmental age) Does the patient have difficulty walking or climbing stairs?: No Weakness of Legs: None Weakness of Arms/Hands: None  Home Assistive Devices/Equipment Home Assistive Devices/Equipment: None  Therapy Consults (therapy consults require a physician order) PT Evaluation Needed: No OT Evalulation Needed: No SLP Evaluation Needed: No Abuse/Neglect Assessment (Assessment to be complete while patient is alone) Physical Abuse: Denies (possible hx but did not report; mentioned past trauma) Verbal Abuse: Denies Sexual Abuse: Denies Exploitation of patient/patient's resources:  Denies Self-Neglect: Denies Values / Beliefs Cultural  Requests During Hospitalization: None Spiritual Requests During Hospitalization: None Consults Spiritual Care Consult Needed: No Social Work Consult Needed: No Regulatory affairs officer (For Healthcare) Advance Directive: Patient does not have advance directive Pre-existing out of facility DNR order (yellow form or pink MOST form): No    Additional Information 1:1 In Past 12 Months?: No CIRT Risk: No Elopement Risk: No Does patient have medical clearance?: Yes     Disposition:  Disposition Initial Assessment Completed for this Encounter: Yes Disposition of Patient: Inpatient treatment program Type of inpatient treatment program: Adult  On Site Evaluation by:   Reviewed with Physician:    John Giovanni. 10/19/2013 2:40 PM

## 2013-10-19 NOTE — ED Provider Notes (Signed)
Medical screening examination/treatment/procedure(s) were performed by non-physician practitioner and as supervising physician I was immediately available for consultation/collaboration.   EKG Interpretation None        Hoy Morn, MD 10/19/13 1600

## 2013-10-19 NOTE — Progress Notes (Signed)
The following facilities have been contacted regarding inptx on pts behalf:  [facility]- per Mariam at capacity [facility]- per Lacey at capacity [facility]- per Kathey at capacity [facility]- per Kayla on 2 gero beds [facility]- per [facility] at capacity [facility]- per Kay at capacity [facility]- per Ashley on child beds [facility]- per Kristine at capacity [facility]- per Jeff at capacity [facility]- per Leah at capacity [facility]- per Kristin at capacity  [facility]- per Susie beds available, referral faxed [facility]- per Kathey beds available, referral faxed [facility]- per Mary can fax info, referral faxed [facility]- per Earlene beds available, referral faxed [facility]- per Amy low acuity beds only, referral faxed [facility]- per Marian at capacity, faxed for possible wait list   Raeshawn Vo Disposition [facility]    

## 2013-10-20 ENCOUNTER — Encounter (HOSPITAL_COMMUNITY): Payer: Self-pay | Admitting: Psychiatry

## 2013-10-20 DIAGNOSIS — F3130 Bipolar disorder, current episode depressed, mild or moderate severity, unspecified: Secondary | ICD-10-CM

## 2013-10-20 DIAGNOSIS — F411 Generalized anxiety disorder: Secondary | ICD-10-CM

## 2013-10-20 DIAGNOSIS — F4310 Post-traumatic stress disorder, unspecified: Secondary | ICD-10-CM

## 2013-10-20 DIAGNOSIS — R45851 Suicidal ideations: Secondary | ICD-10-CM

## 2013-10-20 DIAGNOSIS — F29 Unspecified psychosis not due to a substance or known physiological condition: Secondary | ICD-10-CM | POA: Insufficient documentation

## 2013-10-20 DIAGNOSIS — F319 Bipolar disorder, unspecified: Secondary | ICD-10-CM | POA: Diagnosis present

## 2013-10-20 DIAGNOSIS — F1910 Other psychoactive substance abuse, uncomplicated: Secondary | ICD-10-CM | POA: Diagnosis present

## 2013-10-20 LAB — GC/CHLAMYDIA PROBE AMP
CT Probe RNA: NEGATIVE
GC Probe RNA: NEGATIVE

## 2013-10-20 NOTE — ED Notes (Signed)
Report to [facility] RN @ [facility].

## 2013-10-20 NOTE — ED Notes (Signed)
IVC papers faxed to [facility].  [phone].

## 2013-10-20 NOTE — ED Notes (Signed)
Pt accepted to [facility], Accepting MD, Dr Launa Grill.  Call report to [phone], prior to pt leaving our facility.

## 2013-10-20 NOTE — Consult Note (Signed)
[name]   Reason for Consult:  Depression with suicidal ideations. Referring Physician:  EDP [name] is an 43 y.o. female. Total Time spent with patient: 15 minutes  Assessment: AXIS I:  Anxiety Disorder NOS, Bipolar, Depressed and Post Traumatic Stress Disorder; substance abuse AXIS II:  Deferred AXIS III:   Past Medical History  Diagnosis Date  . Manic-depressive disorder   . Anxiety   . PTSD (post-traumatic stress disorder)   . Hypertension   . Asthma   . Acid reflux    AXIS IV:  other psychosocial or environmental problems, problems related to social environment and problems with primary support group AXIS V:  41-50 serious symptoms  Plan:  Recommend psychiatric Inpatient admission when medically cleared.  Dr. De Nurse assessed the patient and concurs with the plan below.  Subjective:   [name] is a 43 y.o. female patient admitted with Bipolar depression with suicidal ideations.  HPI:  Pt presents in ED for suicidal thoughts and plan and intent to harm self via cutting. Pt has prior hx of cutting in suicide attempts and 3 inpatient hospitalization placements due to attempts. Pt has long hx of Bi-Polar and enduring manic behaviors as a result of being off medications. Pt confirms she has been off meds for 3+months and is seen by [facility] in [place]. Lives in [place] now. Pt reports stressor is a recent court verdict as well as life stressors. Pt is cooperative and polite and reports she wants help to get her medications straight and to "stop feeling like I want to hurt myself." Pt uses cocaine and alcohol and THC on occasion. Pt reports not being able to sleep more than an hour per night and is not eating well.  Pt reports hx of hearing voices and "sometimes hears voices or sounds but it's related to me not on my medication."  Pt reports hx of Prozac and Seroquel use but can't remember other meds.  HPI Elements:    Location:  generalized. Quality:  acute. Severity:  severe. Timing:  constant. Duration:  worse over the past 2 weeks. Context:  stressors.  Past Psychiatric History: Past Medical History  Diagnosis Date  . Manic-depressive disorder   . Anxiety   . PTSD (post-traumatic stress disorder)   . Hypertension   . Asthma   . Acid reflux     reports that she has been smoking.  She does not have any smokeless tobacco history on file. She reports that she drinks alcohol. She reports that she does not use illicit drugs. History reviewed. No pertinent family history. Family History Substance Abuse: No Family Supports: No Living Arrangements: Parent Can pt return to current living arrangement?: Yes Abuse/Neglect [facility]) Physical Abuse: Denies (possible hx but did not report; mentioned past trauma) Verbal Abuse: Denies Sexual Abuse: Denies Allergies:  No Known Allergies  ACT Assessment Complete:  Yes:    Educational Status    Risk to Self: Risk to self Suicidal Ideation: Yes-Currently Present Suicidal Intent: Yes-Currently Present Is patient at risk for suicide?: Yes Suicidal Plan?: Yes-Currently Present Specify Current Suicidal Plan: cut wrist Access to Means: Yes Specify Access to Suicidal Means: can get a knife What has been your use of drugs/alcohol within the last 12 months?: cocaine, thc and alcohol Previous Attempts/Gestures: Yes How many times?: 3 Other Self Harm Risks: na Triggers for Past Attempts: Anniversary;Unpredictable Intentional Self Injurious Behavior: Cutting Comment - Self Injurious Behavior: cutting Family Suicide History: No Recent stressful life event(s):  Conflict (Comment) Persecutory voices/beliefs?: No Depression: Yes Depression Symptoms: Tearfulness;Isolating;Fatigue;Guilt;Loss of interest in usual pleasures;Feeling worthless/self pity;Feeling angry/irritable Substance abuse history and/or treatment for substance abuse?: Yes (UDS positive for cocaine     BAL <11) Suicide prevention information given to non-admitted patients: Not applicable  Risk to Others: Risk to Others Homicidal Ideation: No Thoughts of Harm to Others: No Current Homicidal Intent: No Current Homicidal Plan: No Access to Homicidal Means: No Identified Victim: na History of harm to others?: No Assessment of Violence: None Noted Violent Behavior Description: cooperative Does patient have access to weapons?: No Criminal Charges Pending?: No Does patient have a court date: No  Abuse: Abuse/Neglect Assessment (Assessment to be complete while patient is alone) Physical Abuse: Denies (possible hx but did not report; mentioned past trauma) Verbal Abuse: Denies Sexual Abuse: Denies Exploitation of patient/patient's resources: Denies Self-Neglect: Denies  Prior Inpatient Therapy: Prior Inpatient Therapy Prior Inpatient Therapy: Yes Prior Therapy Dates: 2014 Prior Therapy Facilty/Provider(s): [facility] and [place] Reason for Treatment: si  Prior Outpatient Therapy: Prior Outpatient Therapy Prior Outpatient Therapy: Yes Prior Therapy Dates: 2014 Prior Therapy Facilty/Provider(s): [facility] Reason for Treatment: med mgt  Additional Information: Additional Information 1:1 In Past 12 Months?: No CIRT Risk: No Elopement Risk: No Does patient have medical clearance?: Yes                  Objective: Blood pressure 128/78, pulse 85, temperature 98.1 F (36.7 C), temperature source Oral, resp. rate 18, last menstrual period 10/03/2013, SpO2 100.00%.There is no weight on file to calculate BMI. Results for orders placed during the hospital encounter of 10/19/13 (from the past 72 hour(s))  URINE RAPID DRUG SCREEN (HOSP PERFORMED)     Status: Abnormal   Collection Time    10/19/13 12:40 PM      Result Value Ref Range   Opiates NONE DETECTED  NONE DETECTED   Cocaine POSITIVE (*) NONE DETECTED   Benzodiazepines NONE DETECTED  NONE DETECTED   Amphetamines NONE DETECTED  NONE  DETECTED   Tetrahydrocannabinol NONE DETECTED  NONE DETECTED   Barbiturates NONE DETECTED  NONE DETECTED   Comment:            DRUG SCREEN FOR MEDICAL PURPOSES     ONLY.  IF CONFIRMATION IS NEEDED     FOR ANY PURPOSE, NOTIFY LAB     WITHIN 5 DAYS.                LOWEST DETECTABLE LIMITS     FOR URINE DRUG SCREEN     Drug Class       Cutoff (ng/mL)     Amphetamine      1000     Barbiturate      200     Benzodiazepine   003     Tricyclics       704     Opiates          300     Cocaine          300     THC              50  PREGNANCY, URINE     Status: None   Collection Time    10/19/13 12:40 PM      Result Value Ref Range   Preg Test, Ur NEGATIVE  NEGATIVE   Comment:            THE SENSITIVITY OF THIS     METHODOLOGY IS >20  mIU/mL.  ACETAMINOPHEN LEVEL     Status: None   Collection Time    10/19/13 12:49 PM      Result Value Ref Range   Acetaminophen (Tylenol), Serum <15.0  10 - 30 ug/mL   Comment:            THERAPEUTIC CONCENTRATIONS VARY     SIGNIFICANTLY. A RANGE OF 10-30     ug/mL MAY BE AN EFFECTIVE     CONCENTRATION FOR MANY PATIENTS.     HOWEVER, SOME ARE BEST TREATED     AT CONCENTRATIONS OUTSIDE THIS     RANGE.     ACETAMINOPHEN CONCENTRATIONS     >150 ug/mL AT 4 HOURS AFTER     INGESTION AND >50 ug/mL AT 12     HOURS AFTER INGESTION ARE     OFTEN ASSOCIATED WITH TOXIC     REACTIONS.  CBC     Status: None   Collection Time    10/19/13 12:49 PM      Result Value Ref Range   WBC 8.1  4.0 - 10.5 K/uL   RBC 4.52  3.87 - 5.11 MIL/uL   Hemoglobin 13.3  12.0 - 15.0 g/dL   HCT 40.0  36.0 - 46.0 %   MCV 88.5  78.0 - 100.0 fL   MCH 29.4  26.0 - 34.0 pg   MCHC 33.3  30.0 - 36.0 g/dL   RDW 15.3  11.5 - 15.5 %   Platelets 259  150 - 400 K/uL  COMPREHENSIVE METABOLIC PANEL     Status: Abnormal   Collection Time    10/19/13 12:49 PM      Result Value Ref Range   Sodium 138  137 - 147 mEq/L   Potassium 4.2  3.7 - 5.3 mEq/L   Chloride 101  96 - 112 mEq/L   CO2  25  19 - 32 mEq/L   Glucose, Bld 84  70 - 99 mg/dL   BUN 10  6 - 23 mg/dL   Creatinine, Ser 0.63  0.50 - 1.10 mg/dL   Calcium 9.2  8.4 - 10.5 mg/dL   Total Protein 6.7  6.0 - 8.3 g/dL   Albumin 3.1 (*) 3.5 - 5.2 g/dL   AST 15  0 - 37 U/L   ALT 12  0 - 35 U/L   Alkaline Phosphatase 76  39 - 117 U/L   Total Bilirubin <0.2 (*) 0.3 - 1.2 mg/dL   GFR calc non Af Amer >90  >90 mL/min   GFR calc Af Amer >90  >90 mL/min   Comment: (NOTE)     The eGFR has been calculated using the CKD EPI equation.     This calculation has not been validated in all clinical situations.     eGFR's persistently <90 mL/min signify possible Chronic Kidney     Disease.  ETHANOL     Status: None   Collection Time    10/19/13 12:49 PM      Result Value Ref Range   Alcohol, Ethyl (B) <11  0 - 11 mg/dL   Comment:            LOWEST DETECTABLE LIMIT FOR     SERUM ALCOHOL IS 11 mg/dL     FOR MEDICAL PURPOSES ONLY  SALICYLATE LEVEL     Status: Abnormal   Collection Time    10/19/13 12:49 PM      Result Value Ref Range   Salicylate Lvl <2.0 (*) 2.8 -  20.0 mg/dL  HIV ANTIBODY (ROUTINE TESTING)     Status: None   Collection Time    10/19/13 12:49 PM      Result Value Ref Range   HIV 1&2 Ab, 4th Generation NONREACTIVE  NONREACTIVE   Comment: (NOTE)     A NONREACTIVE HIV Ag/Ab result does not exclude HIV infection since     the time frame for seroconversion is variable. If acute HIV infection     is suspected, a HIV-1 RNA Qualitative TMA test is recommended.     HIV-1/2 Antibody Diff         Not indicated.     HIV-1 RNA, Qual TMA           Not indicated.     PLEASE NOTE: This information has been disclosed to you from records     whose confidentiality may be protected by state law. If your state     requires such protection, then the state law prohibits you from making     any further disclosure of the information without the specific written     consent of the person to whom it pertains, or as otherwise  permitted     by law. A general authorization for the release of medical or other     information is NOT sufficient for this purpose.     The performance of this assay has not been clinically validated in     patients less than 71 years old.     Performed at [facility]     Status: Abnormal   Collection Time    10/19/13  2:47 PM      Result Value Ref Range   Yeast Wet Prep HPF POC NONE SEEN  NONE SEEN   Trich, Wet Prep NONE SEEN  NONE SEEN   Clue Cells Wet Prep HPF POC FEW (*) NONE SEEN   WBC, Wet Prep HPF POC NONE SEEN  NONE SEEN   Labs are reviewed and are pertinent for medical issues being addressed.  No current facility-administered medications for this encounter.   Current Outpatient Prescriptions  Medication Sig Dispense Refill  . albuterol (PROVENTIL HFA;VENTOLIN HFA) 108 (90 BASE) MCG/ACT inhaler Inhale 2 puffs into the lungs every 6 (six) hours as needed. For asthma relief      . carbamazepine (TEGRETOL) 200 MG tablet Take 200 mg by mouth 2 (two) times daily. For mood stabilization.      [name] FLUoxetine (PROZAC) 20 MG capsule Take 40 mg by mouth daily.       [name] HYDROcodone-acetaminophen (NORCO/VICODIN) 5-325 MG per tablet Take 1 tablet by mouth every 4 (four) hours as needed for moderate pain.  10 tablet  0  . ibuprofen (ADVIL,MOTRIN) 800 MG tablet Take 800 mg by mouth every 8 (eight) hours as needed. For pain      . OVER THE COUNTER MEDICATION Take 30 mLs by mouth 4 (four) times daily as needed (cough).        Psychiatric Specialty Exam:     Blood pressure 128/78, pulse 85, temperature 98.1 F (36.7 C), temperature source Oral, resp. rate 18, last menstrual period 10/03/2013, SpO2 100.00%.There is no weight on file to calculate BMI.  General Appearance: Casual  Eye Contact::  Fair  Speech:  Normal Rate  Volume:  Decreased  Mood:  Anxious and Depressed  Affect:  Congruent  Thought Process:  Coherent  Orientation:  Full (Time, Place, and Person)   Thought Content:  Hallucinations: Auditory  Suicidal Thoughts:  Yes.  with intent/plan  Homicidal Thoughts:  No  Memory:  Immediate;   Fair Recent;   Fair Remote;   Fair  Judgement:  Poor  Insight:  Fair  Psychomotor Activity: Normal  Concentration:  Fair  Recall:  [employer] [place]: Fair  Akathisia:  No   Handed:  Right  AIMS (if indicated):     Assets:  Physical Health Resilience  Sleep:      Musculoskeletal: Strength & Muscle Tone: within normal limits Gait & Station: normal Patient leans: N/A  Treatment Plan Summary: Admit to inpatient psychiatry for stabilization, home medications restarted  Waylan Boga, PMH-NP 10/20/2013 5:07 PM Agree with plan.

## 2013-10-21 DIAGNOSIS — F3163 Bipolar disorder, current episode mixed, severe, without psychotic features: Secondary | ICD-10-CM | POA: Insufficient documentation

## 2014-01-04 ENCOUNTER — Encounter (HOSPITAL_COMMUNITY): Payer: Self-pay | Admitting: Emergency Medicine

## 2014-01-04 ENCOUNTER — Emergency Department (HOSPITAL_COMMUNITY): Payer: Medicare Other

## 2014-01-04 ENCOUNTER — Emergency Department (HOSPITAL_COMMUNITY)
Admission: EM | Admit: 2014-01-04 | Discharge: 2014-01-04 | Disposition: A | Discharge status: Home / Self Care | Payer: Medicare Other | Attending: Emergency Medicine | Admitting: Emergency Medicine

## 2014-01-04 DIAGNOSIS — T07XXXA Unspecified multiple injuries, initial encounter: Secondary | ICD-10-CM

## 2014-01-04 DIAGNOSIS — I10 Essential (primary) hypertension: Secondary | ICD-10-CM | POA: Insufficient documentation

## 2014-01-04 DIAGNOSIS — J45909 Unspecified asthma, uncomplicated: Secondary | ICD-10-CM | POA: Diagnosis not present

## 2014-01-04 DIAGNOSIS — IMO0002 Reserved for concepts with insufficient information to code with codable children: Secondary | ICD-10-CM | POA: Insufficient documentation

## 2014-01-04 DIAGNOSIS — F411 Generalized anxiety disorder: Secondary | ICD-10-CM | POA: Diagnosis not present

## 2014-01-04 DIAGNOSIS — F319 Bipolar disorder, unspecified: Secondary | ICD-10-CM | POA: Insufficient documentation

## 2014-01-04 DIAGNOSIS — Z8719 Personal history of other diseases of the digestive system: Secondary | ICD-10-CM | POA: Diagnosis not present

## 2014-01-04 DIAGNOSIS — Y92410 Unspecified street and highway as the place of occurrence of the external cause: Secondary | ICD-10-CM | POA: Diagnosis not present

## 2014-01-04 DIAGNOSIS — Z79899 Other long term (current) drug therapy: Secondary | ICD-10-CM | POA: Diagnosis not present

## 2014-01-04 DIAGNOSIS — Z23 Encounter for immunization: Secondary | ICD-10-CM | POA: Diagnosis not present

## 2014-01-04 DIAGNOSIS — Y9389 Activity, other specified: Secondary | ICD-10-CM | POA: Insufficient documentation

## 2014-01-04 DIAGNOSIS — F17200 Nicotine dependence, unspecified, uncomplicated: Secondary | ICD-10-CM | POA: Insufficient documentation

## 2014-01-04 MED ORDER — HYDROCODONE-ACETAMINOPHEN 5-325 MG PO TABS
1.0000 | ORAL_TABLET | Freq: Four times a day (QID) | ORAL | Status: DC | PRN
Start: 2014-01-04 — End: 2020-04-06

## 2014-01-04 MED ORDER — BACITRACIN ZINC 500 UNIT/GM EX OINT
1.0000 "application " | TOPICAL_OINTMENT | Freq: Two times a day (BID) | CUTANEOUS | Status: DC
  Administered 2014-01-04: 1 via TOPICAL

## 2014-01-04 MED ORDER — HYDROCODONE-ACETAMINOPHEN 5-325 MG PO TABS
2.0000 | ORAL_TABLET | Freq: Once | ORAL | Status: AC
  Administered 2014-01-04: 2 via ORAL
  Filled 2014-01-04: qty 2

## 2014-01-04 MED ORDER — ALPRAZOLAM 0.5 MG PO TABS
0.5000 mg | ORAL_TABLET | Freq: Once | ORAL | Status: AC
  Administered 2014-01-04: 0.5 mg via ORAL
  Filled 2014-01-04: qty 1

## 2014-01-04 MED ORDER — TETANUS-DIPHTH-ACELL PERTUSSIS 5-2.5-18.5 LF-MCG/0.5 IM SUSP
0.5000 mL | Freq: Once | INTRAMUSCULAR | Status: AC
  Administered 2014-01-04: 0.5 mL via INTRAMUSCULAR
  Filled 2014-01-04: qty 0.5

## 2014-01-04 NOTE — ED Notes (Signed)
Pt back from x-ray.
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
## 2014-01-04 NOTE — Discharge Instructions (Signed)
Keep wounds very clean. Wash with warm water and soap 2x/day. Change dressing 1-2 x per day. Take motrin or aleve as need for pain. You may also take hydrocodone as need for pain. No driving for the next 6 hours or when taking hydrocodone. Also, do not take tylenol or acetaminophen containing medication when taking hydrocodone. Follow up with primary care doctor in coming week. If foot pain fails to improve/resolve in the next 1-2 weeks, follow up with orthopedist - see referral. Return to ER if worse, new symptoms, infection of wounds, other concern.      Abrasion An abrasion is a cut or scrape of the skin. Abrasions do not extend through all layers of the skin and most heal within 10 days. It is important to care for your abrasion properly to prevent infection. CAUSES  Most abrasions are caused by falling on, or gliding across, the ground or other surface. When your skin rubs on something, the outer and inner layer of skin rubs off, causing an abrasion. DIAGNOSIS  Your caregiver will be able to diagnose an abrasion during a physical exam.  TREATMENT  Your treatment depends on how large and deep the abrasion is. Generally, your abrasion will be cleaned with water and a mild soap to remove any dirt or debris. An antibiotic ointment may be put over the abrasion to prevent an infection. A bandage (dressing) may be wrapped around the abrasion to keep it from getting dirty.  You may need a tetanus shot if:  You cannot remember when you had your last tetanus shot.  You have never had a tetanus shot.  The injury broke your skin. If you get a tetanus shot, your arm may swell, get red, and feel warm to the touch. This is common and not a problem. If you need a tetanus shot and you choose not to have one, there is a rare chance of getting tetanus. Sickness from tetanus can be serious.  HOME CARE INSTRUCTIONS   If a dressing was applied, change it at least once a day or as directed by your  caregiver. If the bandage sticks, soak it off with warm water.   Wash the area with water and a mild soap to remove all the ointment 2 times a day. Rinse off the soap and pat the area dry with a clean towel.   Reapply any ointment as directed by your caregiver. This will help prevent infection and keep the bandage from sticking. Use gauze over the wound and under the dressing to help keep the bandage from sticking.   Change your dressing right away if it becomes wet or dirty.   Only take over-the-counter or prescription medicines for pain, discomfort, or fever as directed by your caregiver.   Follow up with your caregiver within 24-48 hours for a wound check, or as directed. If you were not given a wound-check appointment, look closely at your abrasion for redness, swelling, or pus. These are signs of infection. SEEK IMMEDIATE MEDICAL CARE IF:   You have increasing pain in the wound.   You have redness, swelling, or tenderness around the wound.   You have pus coming from the wound.   You have a fever or persistent symptoms for more than 2-3 days.  You have a fever and your symptoms suddenly get worse.  You have a bad smell coming from the wound or dressing.  MAKE SURE YOU:   Understand these instructions.  Will watch your condition.  Will get help  right away if you are not doing well or get worse. Document Released: 03/22/2005 Document Revised: 05/29/2012 Document Reviewed: 05/16/2011 [facility] Patient Information 2015 [place], [place]. This information is not intended to replace advice given to you by your health care provider. Make sure you discuss any questions you have with your health care provider.   Crutch Use Crutches are used to take weight off one of your legs or feet when you stand or walk. It is important to use crutches that fit properly. When fitted properly:  Each crutch should be 2-3 finger widths below the armpit.  Your weight should be supported by  your hand, and not by resting the armpit on the crutch.  RISKS AND COMPLICATIONS Damage to the nerves that extend from your armpit to your hand and arm. To prevent this from happening, make sure your crutches fit properly and do not put pressure on your armpit when using them. HOW TO USE YOUR CRUTCHES If you have been instructed to use partial weight bearing, apply (bear) the amount of weight as your health care provider suggests. Do not bear weight in an amount that causes pain to the area of injury. Walking  Step with the crutches.  Swing the healthy leg slightly ahead of the crutches. Going Up Steps If there is no handrail:  Step up with the healthy leg.  Step up with the crutches and injured leg.  Continue in this way. If there is a handrail:  Hold both crutches in one hand.  Place your free hand on the handrail.  While putting your weight on your arms, lift your healthy leg to the step.  Bring the crutches and the injured leg up to that step.  Continue in this way. Going Down Steps Be very careful, as going down stairs with crutches is very challenging. If there is no handrail:  Step down with the injured leg and crutches.  Step down with the healthy leg. If there is a handrail: 1. Place your hand on the handrail. 2. Hold both crutches with your free hand. 3. Lower your injured leg and crutch to the step below you. Make sure to keep the crutch tips in the center of the step, never on the edge. 4. Lower your healthy leg to that step. 5. Continue in this way. Standing Up 1. Hold the injured leg forward. 2. Grab the armrest with one hand and the top of the crutches with the other hand. 3. Using these supports, pull yourself up to a standing position. Sitting Down 1. Hold the injured leg forward. 2. Grab the armrest with one hand and the top of the crutches with the other hand. 3. Lower yourself to a sitting position. SEEK MEDICAL CARE IF:  You still feel unsteady  on your feet.  You develop new pain, for example in your armpits, back, shoulder, wrist, or hip.  You develop any numbness or tingling. SEEK IMMEDIATE MEDICAL CARE IF: You fall. Document Released: 06/09/2000 Document Revised: 06/17/2013 Document Reviewed: 02/17/2013 [facility] Patient Information 2015 [place], [place]. This information is not intended to replace advice given to you by your health care provider. Make sure you discuss any questions you have with your health care provider.  Wound Care Wound care helps prevent pain and infection.  You may need a tetanus shot if:  You cannot remember when you had your last tetanus shot.  You have never had a tetanus shot.  The injury broke your skin. If you need a tetanus shot and  you choose not to have one, you may get tetanus. Sickness from tetanus can be serious. HOME CARE   Only take medicine as told by your doctor.  Clean the wound daily with mild soap and water.  Change any bandages (dressings) as told by your doctor.  Put medicated cream and a bandage on the wound as told by your doctor.  Change the bandage if it gets wet, dirty, or starts to smell.  Take showers. Do not take baths, swim, or do anything that puts your wound under water.  Rest and raise (elevate) the wound until the pain and puffiness (swelling) are better.  Keep all doctor visits as told. GET HELP RIGHT AWAY IF:   Yellowish-white fluid (pus) comes from the wound.  Medicine does not lessen your pain.  There is a red streak going away from the wound.  You have a fever. MAKE SURE YOU:   Understand these instructions.  Will watch your condition.  Will get help right away if you are not doing well or get worse. Document Released: 03/21/2008 Document Revised: 09/04/2011 Document Reviewed: 10/16/2010 [facility] Patient Information 2015 [place], [place]. This information is not intended to replace advice given to you by your health care provider. Make sure  you discuss any questions you have with your health care provider.    Contusion A contusion is a deep bruise. Contusions are the result of an injury that caused bleeding under the skin. The contusion may turn blue, purple, or yellow. Minor injuries will give you a painless contusion, but more severe contusions may stay painful and swollen for a few weeks.  CAUSES  A contusion is usually caused by a blow, trauma, or direct force to an area of the body. SYMPTOMS   Swelling and redness of the injured area.  Bruising of the injured area.  Tenderness and soreness of the injured area.  Pain. DIAGNOSIS  The diagnosis can be made by taking a history and physical exam. An X-ray, CT scan, or MRI may be needed to determine if there were any associated injuries, such as fractures. TREATMENT  Specific treatment will depend on what area of the body was injured. In general, the best treatment for a contusion is resting, icing, elevating, and applying cold compresses to the injured area. Over-the-counter medicines may also be recommended for pain control. Ask your caregiver what the best treatment is for your contusion. HOME CARE INSTRUCTIONS   Put ice on the injured area.  Put ice in a plastic bag.  Place a towel between your skin and the bag.  Leave the ice on for 15-20 minutes, 3-4 times a day, or as directed by your health care provider.  Only take over-the-counter or prescription medicines for pain, discomfort, or fever as directed by your caregiver. Your caregiver may recommend avoiding anti-inflammatory medicines (aspirin, ibuprofen, and naproxen) for 48 hours because these medicines may increase bruising.  Rest the injured area.  If possible, elevate the injured area to reduce swelling. SEEK IMMEDIATE MEDICAL CARE IF:   You have increased bruising or swelling.  You have pain that is getting worse.  Your swelling or pain is not relieved with medicines. MAKE SURE YOU:   Understand  these instructions.  Will watch your condition.  Will get help right away if you are not doing well or get worse. Document Released: 03/22/2005 Document Revised: 06/17/2013 Document Reviewed: 04/17/2011 [facility] Patient Information 2015 [place], [place]. This information is not intended to replace advice given to you by your  health care provider. Make sure you discuss any questions you have with your health care provider.   Foot Sprain The muscles and cord like structures which attach muscle to bone (tendons) that surround the feet are made up of units. A foot sprain can occur at the weakest spot in any of these units. This condition is most often caused by injury to or overuse of the foot, as from playing contact sports, or aggravating a previous injury, or from poor conditioning, or obesity. SYMPTOMS  Pain with movement of the foot.  Tenderness and swelling at the injury site.  Loss of strength is present in moderate or severe sprains. THE THREE GRADES OR SEVERITY OF FOOT SPRAIN ARE:  Mild (Grade I): Slightly pulled muscle without tearing of muscle or tendon fibers or loss of strength.  Moderate (Grade II): Tearing of fibers in a muscle, tendon, or at the attachment to bone, with small decrease in strength.  Severe (Grade III): Rupture of the muscle-tendon-bone attachment, with separation of fibers. Severe sprain requires surgical repair. Often repeating (chronic) sprains are caused by overuse. Sudden (acute) sprains are caused by direct injury or over-use. DIAGNOSIS  Diagnosis of this condition is usually by your own observation. If problems continue, a caregiver may be required for further evaluation and treatment. X-rays may be required to make sure there are not breaks in the bones (fractures) present. Continued problems may require physical therapy for treatment. PREVENTION  Use strength and conditioning exercises appropriate for your sport.  Warm up properly prior to working  out.  Use athletic shoes that are made for the sport you are participating in.  Allow adequate time for healing. Early return to activities makes repeat injury more likely, and can lead to an unstable arthritic foot that can result in prolonged disability. Mild sprains generally heal in 3 to 10 days, with moderate and severe sprains taking 2 to 10 weeks. Your caregiver can help you determine the proper time required for healing. HOME CARE INSTRUCTIONS   Apply ice to the injury for 15-20 minutes, 03-04 times per day. Put the ice in a plastic bag and place a towel between the bag of ice and your skin.  An elastic wrap (like an Ace bandage) may be used to keep swelling down.  Keep foot above the level of the heart, or at least raised on a footstool, when swelling and pain are present.  Try to avoid use other than gentle range of motion while the foot is painful. Do not resume use until instructed by your caregiver. Then begin use gradually, not increasing use to the point of pain. If pain does develop, decrease use and continue the above measures, gradually increasing activities that do not cause discomfort, until you gradually achieve normal use.  Use crutches if and as instructed, and for the length of time instructed.  Keep injured foot and ankle wrapped between treatments.  Massage foot and ankle for comfort and to keep swelling down. Massage from the toes up towards the knee.  Only take over-the-counter or prescription medicines for pain, discomfort, or fever as directed by your caregiver. SEEK IMMEDIATE MEDICAL CARE IF:   Your pain and swelling increase, or pain is not controlled with medications.  You have loss of feeling in your foot or your foot turns cold or blue.  You develop new, unexplained symptoms, or an increase of the symptoms that brought you to your caregiver. MAKE SURE YOU:   Understand these instructions.  Will watch your  condition.  Will get help right away if  you are not doing well or get worse. Document Released: 12/02/2001 Document Revised: 09/04/2011 Document Reviewed: 01/30/2008 [facility] Patient Information 2015 [place], [place]. This information is not intended to replace advice given to you by your health care provider. Make sure you discuss any questions you have with your health care provider.     Emergency Department Resource Guide 1) Find a Doctor and Pay Out of Pocket Although you won't have to find out who is covered by your insurance plan, it is a good idea to ask around and get recommendations. You will then need to call the office and see if the doctor you have chosen will accept you as a new patient and what types of options they offer for patients who are self-pay. Some doctors offer discounts or will set up payment plans for their patients who do not have insurance, but you will need to ask so you aren't surprised when you get to your appointment.  2) Contact Your Local Health Department Not all health departments have doctors that can see patients for sick visits, but many do, so it is worth a call to see if yours does. If you don't know where your local health department is, you can check in your phone book. The CDC also has a tool to help you locate your state's health department, and many state websites also have listings of all of their local health departments.  3) Find a [facility] If your illness is not likely to be very severe or complicated, you may want to try a walk in clinic. These are popping up all over the country in pharmacies, drugstores, and shopping centers. They're usually staffed by nurse practitioners or physician assistants that have been trained to treat common illnesses and complaints. They're usually fairly quick and inexpensive. However, if you have serious medical issues or chronic medical problems, these are probably not your best option.  No Primary Care Doctor: - Call Health Connect at  [phone] -  they can help you locate a primary care doctor that  accepts your insurance, provides certain services, etc. - Physician Referral Service- [phone]  Chronic Pain Problems: Organization         Address  Phone   Notes  [facility]  [phone] Patients need to be referred by their primary care doctor.   Medication Assistance: Organization         Address  Phone   Notes  [facility] Medication [facility] [address] [place], [place] [address] [phone] --Must be a resident of [facility] -- Must have NO insurance coverage whatsoever (no Medicaid/ Medicare, etc.) -- The pt. MUST have a primary care doctor that directs their care regularly and follows them in the community   MedAssist  [phone]   [employer]  [phone]    Agencies that provide inexpensive medical care: Organization         Address  Phone   Notes  [place]  [phone]   [facility]    [phone]   [facility] [place], [place] [address] [phone]   [place] [address] [place] [phone]   Planned Parenthood    [phone]   [facility]    [phone]   [place] and [place], [place]  Phone:  [phone], Fax:  [phone] Hours of Operation:  9 am - 6 pm, M-F.  Also accepts Medicaid/Medicare and self-pay.  [facility] for [place], [address], [place] Phone: [phone], Fax: [phone]. Hours of Operation:  8:30 am - 5:30 pm, M-F.  Also accepts Medicaid and self-pay.  [facility] [place] [address] [place] Phone: [phone]   [place], [place], [place] [phone], Ext. [phone] Mondays & Thursdays: 7-9 AM.  First 15 patients are seen on a first come, first serve basis.    [place] Providers:  Organization         Address  Phone   Notes  [facility] [address], [place] [phone] Also accepts self-pay patients.  [facility] [address], [place]  [phone]   [place], [address], [place] [phone]   [facility] [address], [place] [phone]   Lucianne Lei [address], [place]   [phone] Only accepts [place] Access [place] patients after they have their name applied to their card.   Self-Pay (no insurance) in [facility]:  Organization         Address  Phone   Notes  Sickle Cell Patients, [facility] [place] [phone]   [facility] [place] [phone]   [facility] [place]  [place], [place], [place] [phone]   [facility]/Dr. Osei-Bonsu  [address], [place] or [address], [place] [phone] Phone number for both [place] and [place] locations is the same.  [facility]gent [facility] and [facility] [address] [place] [phone]   [facility] [address] [place] or [address] [phone]   [facility] [address] [place] [phone], phone; [phone], fax Sees patients 1st and 3rd Saturday of every month.  Must not qualify for public or private insurance (i.e. Medicaid, Medicare, [place] Health Choice, Veterans' Benefits)  Household income should be no more than 200% of the poverty level The clinic cannot treat you if you are pregnant or think you are pregnant  Sexually transmitted diseases are not treated at the clinic.    Dental Care: Organization         Address  Phone  Notes  [facility] Department of [facility] [place] [phone] Accepts children up to age 68 who are enrolled in [place] or [place]; pregnant women with a Medicaid card; and children who have applied for Medicaid or [place] Health Choice, but were declined, whose parents can pay a reduced fee at time of service.  [facility] Department of [facility]  [address], [place] [phone] Accepts children up to age 16 who are enrolled in [place] or [place]; pregnant women with a Medicaid card; and children who have applied for Medicaid or [place] Health Choice, but were declined, whose parents can pay a reduced fee at time of service.  [facility]  [place] [phone] Patients are seen by appointment  only. Walk-ins are not accepted. [place] will see patients 72 years of age and older. Monday - Tuesday (8am-5pm) Most Wednesdays (8:30-5pm) $30 per visit, cash only  [facility] Access PRO[facility]  [address], [facility] [phone] Patients are seen by appointment only. Walk-ins are not accepted. [place] will see patients 100 years of age and older. One Wednesday Evening (Monthly: Volunteer Based).  $30 per visit, cash only  [place]  [phone] for adults; Children under age 32, call Graduate Pediatric Dentistry at [phone]. Children aged 94-14, please call [phone] to request a pediatric application.  Dental services are provided in all areas of dental care including fillings, crowns and bridges, complete and partial dentures, implants, gum treatment, root canals, and extractions. Preventive care is also provided. Treatment is provided to both adults and children. Patients are selected via a lottery and there is often a waiting list.   [facility] [address] [place]  [phone] [email or web address]   Rescue  [facility] [address] [place], [place] [phone], Ext. [phone] Second and Fourth Thursday of each month, opens at 6:30 AM; Clinic ends at 9 AM.  Patients are seen on a first-come first-served basis, and a limited number are seen during each clinic.   [facility]  [address] [place], [place] [phone]   Eligibility Requirements You must have lived in [place], [place], or [place] counties for at least the last three months.   You cannot be eligible for state or federal sponsored [employer], including [employer], [place], or [employer].   You generally cannot be eligible for healthcare insurance through your employer.    How to apply: Eligibility screenings are held every Tuesday and Wednesday afternoon from 1:00 pm until 4:00 pm. You do not need an appointment for the interview!  [facility] [address] [place], [place]   [employer]  [facility] Department  [place]  [phone]    Behavioral Health Resources in the Community: Intensive Outpatient Programs Organization         Address  Phone  Notes  [facility] [place]. [address], [place], [place] [phone]   [facility] Outpatient [address], [place], [place]   ADS: Alcohol & Drug S[facility] [address] [place], [place]   [place] [address],  [place], [place] or [phone]   Substance Abuse Resources Organization         Address  Phone  Notes  Alcohol and Drug Services  [phone]   [place]  [phone]   [facility] [place]   Chinita Pester  [phone]   Residential & Outpatient Substance Abuse Program  [phone]   Psychological Services Organization         Address  Phone  Notes  [facility] [place]   [place]  [phone]   [place] [address] [place] [phone] or [phone]    Mobile Crisis Teams Organization         Address  Phone  Notes  [facility], Mobile Crisis Care Unit  [phone]   [facility]ive Psychotherapeutic Services  [address] [place], [place]   [facility] [address], [place] [phone]  Self-Help/Support Groups Organization         Address  Phone             Notes  [facility]ental [facility] Assoc. of [place] - variety of support groups  [place] Call for more information  Narcotics Anonymous (NA), [facility] [address], [employer] [place]  2 meetings at this location   Special educational needs teacher         Address  Phone  Notes  [facility] [place],    [place]  [phone]   [facility]  [address], [place] [id number], [place], [place]   [place], [place] [phone] Admissions: 8am-3pm M-F  Incentives Substance [place] [address],    [employer], [place] [phone]   [facility] [address] [address], [place], [place]   [facility] [address]  [place], [place]   [facility] [address], [place], [place]   [facility] ([address]  [place], [place] [phone] or [phone]   [facility] [address], [place], [place] Accepts Medicaid  [facility] [address],  [place] [phone] Substance Abuse/Addiction Treatment   [facility] Organization         Address  Phone  Notes  [facility]  [phone]   Domenic Schwab, PhD [address] [place], [place]   [phone] or  [phone]   [place], [place] [phone]   [facility] [address], [place], [place] [phone] Insurance/Medicaid/sponsorship through [facility] [address]                                    [place], [place] [phone] [place] [address], [place] [phone]    Dr. Adele Schilder  [phone]   [facility] Dept[facility] 1) [address], [place] 2) [place] 3)  [address], [place] [phone]  [phone]   [place] [phone] or [phone] (After Hours)

## 2014-01-04 NOTE — ED Notes (Signed)
md at bedside

## 2014-07-08 DIAGNOSIS — F332 Major depressive disorder, recurrent severe without psychotic features: Secondary | ICD-10-CM | POA: Diagnosis not present

## 2014-07-09 DIAGNOSIS — F332 Major depressive disorder, recurrent severe without psychotic features: Secondary | ICD-10-CM | POA: Diagnosis not present

## 2014-07-10 DIAGNOSIS — F332 Major depressive disorder, recurrent severe without psychotic features: Secondary | ICD-10-CM | POA: Diagnosis not present

## 2014-07-11 DIAGNOSIS — F332 Major depressive disorder, recurrent severe without psychotic features: Secondary | ICD-10-CM | POA: Diagnosis not present

## 2014-08-05 ENCOUNTER — Encounter (HOSPITAL_COMMUNITY): Payer: Self-pay | Admitting: Emergency Medicine

## 2014-08-05 ENCOUNTER — Emergency Department (HOSPITAL_COMMUNITY)
Admission: EM | Admit: 2014-08-05 | Discharge: 2014-08-05 | Disposition: A | Discharge status: Home / Self Care | Payer: Medicare Other | Attending: Emergency Medicine | Admitting: Emergency Medicine

## 2014-08-05 DIAGNOSIS — K219 Gastro-esophageal reflux disease without esophagitis: Secondary | ICD-10-CM | POA: Insufficient documentation

## 2014-08-05 DIAGNOSIS — F4320 Adjustment disorder, unspecified: Secondary | ICD-10-CM | POA: Diagnosis not present

## 2014-08-05 DIAGNOSIS — R258 Other abnormal involuntary movements: Secondary | ICD-10-CM | POA: Diagnosis present

## 2014-08-05 DIAGNOSIS — I10 Essential (primary) hypertension: Secondary | ICD-10-CM | POA: Diagnosis not present

## 2014-08-05 DIAGNOSIS — F319 Bipolar disorder, unspecified: Secondary | ICD-10-CM | POA: Insufficient documentation

## 2014-08-05 DIAGNOSIS — Z79899 Other long term (current) drug therapy: Secondary | ICD-10-CM | POA: Diagnosis not present

## 2014-08-05 DIAGNOSIS — F419 Anxiety disorder, unspecified: Secondary | ICD-10-CM | POA: Insufficient documentation

## 2014-08-05 DIAGNOSIS — J45909 Unspecified asthma, uncomplicated: Secondary | ICD-10-CM | POA: Diagnosis not present

## 2014-08-05 DIAGNOSIS — Z720 Tobacco use: Secondary | ICD-10-CM | POA: Insufficient documentation

## 2014-08-05 MED ORDER — ACETAMINOPHEN 325 MG PO TABS
650.0000 mg | ORAL_TABLET | Freq: Once | ORAL | Status: AC
  Administered 2014-08-05: 650 mg via ORAL
  Filled 2014-08-05: qty 2

## 2014-08-05 NOTE — ED Notes (Signed)
Magistrate denied drawing up IVC papers.
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
## 2014-08-05 NOTE — Discharge Instructions (Signed)
Return to emergency room as needed for any additional difficulties.
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
## 2014-12-29 DIAGNOSIS — Z9119 Patient's noncompliance with other medical treatment and regimen: Secondary | ICD-10-CM | POA: Diagnosis not present

## 2014-12-29 DIAGNOSIS — F419 Anxiety disorder, unspecified: Secondary | ICD-10-CM | POA: Diagnosis not present

## 2014-12-29 DIAGNOSIS — F17210 Nicotine dependence, cigarettes, uncomplicated: Secondary | ICD-10-CM | POA: Diagnosis not present

## 2014-12-29 DIAGNOSIS — F332 Major depressive disorder, recurrent severe without psychotic features: Secondary | ICD-10-CM | POA: Diagnosis not present

## 2014-12-29 DIAGNOSIS — I10 Essential (primary) hypertension: Secondary | ICD-10-CM | POA: Diagnosis not present

## 2014-12-29 DIAGNOSIS — F329 Major depressive disorder, single episode, unspecified: Secondary | ICD-10-CM | POA: Diagnosis not present

## 2014-12-30 DIAGNOSIS — F17210 Nicotine dependence, cigarettes, uncomplicated: Secondary | ICD-10-CM | POA: Diagnosis present

## 2014-12-30 DIAGNOSIS — Z9119 Patient's noncompliance with other medical treatment and regimen: Secondary | ICD-10-CM | POA: Diagnosis present

## 2014-12-30 DIAGNOSIS — F329 Major depressive disorder, single episode, unspecified: Secondary | ICD-10-CM | POA: Diagnosis not present

## 2014-12-30 DIAGNOSIS — F332 Major depressive disorder, recurrent severe without psychotic features: Secondary | ICD-10-CM | POA: Diagnosis not present

## 2014-12-30 DIAGNOSIS — I10 Essential (primary) hypertension: Secondary | ICD-10-CM | POA: Diagnosis present

## 2014-12-30 DIAGNOSIS — F419 Anxiety disorder, unspecified: Secondary | ICD-10-CM | POA: Diagnosis present

## 2015-01-20 DIAGNOSIS — F319 Bipolar disorder, unspecified: Secondary | ICD-10-CM | POA: Diagnosis not present

## 2015-01-20 DIAGNOSIS — F3112 Bipolar disorder, current episode manic without psychotic features, moderate: Secondary | ICD-10-CM | POA: Diagnosis not present

## 2015-02-05 DIAGNOSIS — F3112 Bipolar disorder, current episode manic without psychotic features, moderate: Secondary | ICD-10-CM | POA: Diagnosis not present

## 2015-02-05 DIAGNOSIS — F319 Bipolar disorder, unspecified: Secondary | ICD-10-CM | POA: Diagnosis not present

## 2015-06-24 DIAGNOSIS — F1420 Cocaine dependence, uncomplicated: Secondary | ICD-10-CM | POA: Diagnosis present

## 2015-06-24 DIAGNOSIS — F319 Bipolar disorder, unspecified: Secondary | ICD-10-CM | POA: Diagnosis not present

## 2015-06-24 DIAGNOSIS — F4310 Post-traumatic stress disorder, unspecified: Secondary | ICD-10-CM | POA: Diagnosis present

## 2015-06-25 DIAGNOSIS — F319 Bipolar disorder, unspecified: Secondary | ICD-10-CM | POA: Diagnosis not present

## 2016-11-09 DIAGNOSIS — R45851 Suicidal ideations: Secondary | ICD-10-CM | POA: Diagnosis not present

## 2018-06-19 ENCOUNTER — Other Ambulatory Visit: Payer: Self-pay

## 2018-06-19 ENCOUNTER — Emergency Department (HOSPITAL_COMMUNITY)
Admission: EM | Admit: 2018-06-19 | Discharge: 2018-06-19 | Disposition: A | Discharge status: Home / Self Care | Payer: Medicare Other | Attending: Emergency Medicine | Admitting: Emergency Medicine

## 2018-06-19 ENCOUNTER — Encounter (HOSPITAL_COMMUNITY): Payer: Self-pay

## 2018-06-19 DIAGNOSIS — H109 Unspecified conjunctivitis: Secondary | ICD-10-CM | POA: Diagnosis not present

## 2018-06-19 DIAGNOSIS — B070 Plantar wart: Secondary | ICD-10-CM | POA: Diagnosis not present

## 2018-06-19 DIAGNOSIS — H1032 Unspecified acute conjunctivitis, left eye: Secondary | ICD-10-CM | POA: Diagnosis not present

## 2018-06-19 DIAGNOSIS — F17200 Nicotine dependence, unspecified, uncomplicated: Secondary | ICD-10-CM | POA: Insufficient documentation

## 2018-06-19 MED ORDER — ERYTHROMYCIN 5 MG/GM OP OINT: TOPICAL_OINTMENT | OPHTHALMIC | 0 refills | Status: DC

## 2018-06-19 NOTE — Discharge Instructions (Signed)
Use the antibiotic ointment as prescribed. You will need to see the foot doctor for management of your wart. Follow-up with the eye doctor as well. Return to ED for worsening symptoms, increased eye irritation or if you start to develop swelling, increase in blurry vision or pain with moving your eye.

## 2018-06-19 NOTE — ED Triage Notes (Signed)
Pt states that has had a left eye infection for 1 week. Pt states that she also has a planters wart on her right foot that is "really, really hurting"

## 2018-06-19 NOTE — ED Notes (Signed)
Patient given discharge teaching and verbalized understanding. Patient ambulated out of ED with a steady gait. 

## 2018-06-19 NOTE — ED Provider Notes (Signed)
[facility] DEPT Provider Note   CSN: [id number] Arrival date & time: 06/19/18  1036     History   Chief Complaint Chief Complaint  Patient presents with  . Eye Problem  . Plantar Warts    HPI [name] is a 47 y.o. female who presents to ED for multiple complaints. Her first complaint is left eye irritation and redness for the past week.  States that she will wake up with her eyelids matted together.  She has tried over-the-counter Visine with no improvement in her symptoms.  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  She denies any changes in her vision from baseline.  No sick contacts with similar symptoms.  She denies any contact lens or glasses use.  Denies any trauma to the area, foreign body sensation, swelling of the eyelid, pain with EOMs. Her next complaint is a plantars wart on her right foot.  She has had this present for about 2 years.  She had a procedure done 2 years ago but was told that it could return.  She has not been taking any medication help with the pain.  Denies any injuries or falls.  HPI  Past Medical History:  Diagnosis Date  . Acid reflux   . Anxiety   . Asthma   . Hypertension   . Manic-depressive disorder ([place])   . PTSD (post-traumatic stress disorder)     Patient Active Problem List   Diagnosis Date Noted  . Bipolar 1 disorder ([place]) 10/20/2013  . Suicidal ideations 10/20/2013  . Polysubstance abuse ([place]) 10/20/2013    Past Surgical History:  Procedure Laterality Date  . TUBAL LIGATION       OB History   No obstetric history on file.      Home Medications    Prior to Admission medications   Medication Sig Start Date End Date Taking? Authorizing Provider  albuterol (PROVENTIL HFA;VENTOLIN HFA) 108 (90 BASE) MCG/ACT inhaler Inhale 2 puffs into the lungs every 6 (six) hours as needed. For asthma relief    [provider]  clonazePAM  (KLONOPIN) 0.5 MG tablet Take 0.5 mg by mouth 3 (three) times daily as needed for anxiety.    [provider]  erythromycin ophthalmic ointment Place a 1/2 inch ribbon of ointment into the lower eyelid. 06/19/18   Woodruff Skirvin, PA-C  HYDROcodone-acetaminophen (NORCO/VICODIN) 5-325 MG per tablet Take 1-2 tablets by mouth every 6 (six) hours as needed for moderate pain. 01/04/14   Lajean Saver, MD  Lurasidone HCl 20 MG TABS Take 20 mg by mouth every morning.  10/23/13   [provider]  Multiple Vitamin (MULTIVITAMIN WITH MINERALS) TABS tablet Take 1 tablet by mouth every morning.    [provider]  pantoprazole (PROTONIX) 40 MG tablet Take 40 mg by mouth daily.  10/23/13 10/23/14  [provider]  vitamin E 400 UNIT capsule Take 400 Units by mouth every morning.    [provider]    Family History No family history on file.  Social History Social History   Tobacco Use  . Smoking status: Current Every Day Smoker    Packs/day: 0.50  . Smokeless tobacco: Never Used  Substance Use Topics  . Alcohol use: Yes    Comment: ocassionally  . Drug use: No    Comment: states that she does not use drugs anymore     Allergies   Patient has no known  allergies.   Review of Systems Review of Systems  Constitutional: Negative for chills and fever.  Eyes: Positive for discharge, redness and itching. Negative for photophobia, pain and visual disturbance.  Skin:       +plantar wart  Neurological: Negative for weakness, numbness and headaches.     Physical Exam Updated Vital Signs BP (!) 169/116 (BP Location: Left Arm)   Pulse (!) 103   Temp 98.6 F (37 C) (Oral)   Resp 16   Ht 5\' 2"  (1.575 m)   Wt 56.7 kg   LMP 05/30/2018   SpO2 98%   BMI 22.86 kg/m   Physical Exam Vitals signs and nursing note reviewed.  Constitutional:      General: She is not in acute distress.    Appearance: She is well-developed. She is not diaphoretic.  HENT:      Head: Normocephalic and atraumatic.  Eyes:     General: No scleral icterus.       Right eye: Discharge present. No foreign body or hordeolum.     Extraocular Movements:     Right eye: Normal extraocular motion and no nystagmus.     Conjunctiva/sclera: Conjunctivae normal.     Pupils: Pupils are equal, round, and reactive to light.     Comments: Right eye with injected conjunctiva, no eyelid swelling or erythema or tenderness to palpation.  Mild clear tearful drainage noted, some purulent discharge on inner corner.  No foreign bodies noted.  No pain with EOMs.  No chemosis, proptosis, or consensual photophobia.  Neck:     Musculoskeletal: Normal range of motion.  Pulmonary:     Effort: Pulmonary effort is normal. No respiratory distress.  Skin:    Findings: No rash.     Comments: Wart and callus formation noted on R medial foot at the base of the 1st MTP joint.  Neurological:     Mental Status: She is alert.      ED Treatments / Results  Labs (all labs ordered are listed, but only abnormal results are displayed) Labs Reviewed - No data to display  EKG None  Radiology No results found.  Procedures Procedures (including critical care time)  Medications Ordered in ED Medications - No data to display   Initial Impression / Assessment and Plan / ED Course  I have reviewed the triage vital signs and the nursing notes.  Pertinent labs & imaging results that were available during my care of the patient were reviewed by me and considered in my medical decision making (see chart for details).     47 year old female presents for multiple complaints.  She reports left eye irritation, redness and drainage for the past week.  Physical exam findings consistent with conjunctivitis.  No signs of iritis, keratitis or trauma that would concern me for corneal abrasion.  Will treat with antibiotic ointment. Next complaint is plantar wart.  History of similar symptoms in the past.  States  this is causing her pain.  Wart and callus formation noted on exam with no other overlying skin changes or concern for infection.  Will advise follow-up with podiatry.  Patient is hemodynamically stable, in NAD, and able to ambulate in the ED. Evaluation does not show pathology that would require ongoing emergent intervention or inpatient treatment. I explained the diagnosis to the patient. Pain has been managed and has no complaints prior to discharge. Patient is comfortable with above plan and is stable for discharge at this time. All questions were answered prior to  disposition. Strict return precautions for returning to the ED were discussed. Encouraged follow up with PCP.    Portions of this note were generated with [employer]. Dictation errors may occur despite best attempts at proofreading.  Final Clinical Impressions(s) / ED Diagnoses   Final diagnoses:  Acute conjunctivitis of left eye, unspecified acute conjunctivitis type  Plantar wart    ED Discharge Orders         Ordered    erythromycin ophthalmic ointment     06/19/18 1203           Delia Heady, PA-C 06/19/18 1204    Dorie Rank, MD 06/20/18 0700

## 2018-11-17 ENCOUNTER — Emergency Department (HOSPITAL_COMMUNITY): Payer: Medicare Other

## 2018-11-17 ENCOUNTER — Emergency Department (HOSPITAL_COMMUNITY)
Admission: EM | Admit: 2018-11-17 | Discharge: 2018-11-17 | Disposition: A | Discharge status: Home / Self Care | Payer: Medicare Other | Attending: Emergency Medicine | Admitting: Emergency Medicine

## 2018-11-17 ENCOUNTER — Other Ambulatory Visit: Payer: Self-pay

## 2018-11-17 ENCOUNTER — Encounter (HOSPITAL_COMMUNITY): Payer: Self-pay

## 2018-11-17 DIAGNOSIS — N938 Other specified abnormal uterine and vaginal bleeding: Secondary | ICD-10-CM | POA: Diagnosis not present

## 2018-11-17 DIAGNOSIS — F17210 Nicotine dependence, cigarettes, uncomplicated: Secondary | ICD-10-CM | POA: Diagnosis not present

## 2018-11-17 DIAGNOSIS — J45909 Unspecified asthma, uncomplicated: Secondary | ICD-10-CM | POA: Diagnosis not present

## 2018-11-17 DIAGNOSIS — I10 Essential (primary) hypertension: Secondary | ICD-10-CM | POA: Diagnosis not present

## 2018-11-17 DIAGNOSIS — R0602 Shortness of breath: Secondary | ICD-10-CM

## 2018-11-17 DIAGNOSIS — Z79899 Other long term (current) drug therapy: Secondary | ICD-10-CM | POA: Diagnosis not present

## 2018-11-17 DIAGNOSIS — R079 Chest pain, unspecified: Secondary | ICD-10-CM | POA: Diagnosis present

## 2018-11-17 DIAGNOSIS — F3132 Bipolar disorder, current episode depressed, moderate: Secondary | ICD-10-CM | POA: Diagnosis not present

## 2018-11-17 LAB — BASIC METABOLIC PANEL
Anion gap: 10 (ref 5–15)
BUN: 15 mg/dL (ref 6–20)
CO2: 23 mmol/L (ref 22–32)
Calcium: 9.2 mg/dL (ref 8.9–10.3)
Chloride: 108 mmol/L (ref 98–111)
Creatinine, Ser: 0.79 mg/dL (ref 0.44–1.00)
GFR calc Af Amer: >60 mL/min (ref >60)
GFR calc non Af Amer: >60 mL/min (ref >60)
Glucose, Bld: 86 mg/dL (ref 70–99)
Potassium: 4.2 mmol/L (ref 3.5–5.1)
Sodium: 141 mmol/L (ref 135–145)

## 2018-11-17 LAB — CBC
HCT: 41.4 % (ref 36.0–46.0)
Hemoglobin: 13.9 g/dL (ref 12.0–15.0)
MCH: 30.2 pg (ref 26.0–34.0)
MCHC: 33.6 g/dL (ref 30.0–36.0)
MCV: 90 fL (ref 80.0–100.0)
Platelets: 254 K/uL (ref 150–400)
RBC: 4.6 MIL/uL (ref 3.87–5.11)
RDW: 13.6 % (ref 11.5–15.5)
WBC: 10.6 K/uL — ABNORMAL HIGH (ref 4.0–10.5)
nRBC: 0 % (ref 0.0–0.2)

## 2018-11-17 LAB — I-STAT BETA HCG BLOOD, ED (MC, WL, AP ONLY): I-stat hCG, quantitative: <5 m[IU]/mL (ref <5)

## 2018-11-17 LAB — TROPONIN I: Troponin I: <0.03 ng/mL

## 2018-11-17 MED ORDER — SODIUM CHLORIDE 0.9% FLUSH: 3.0000 mL | Freq: Once | INTRAVENOUS | Status: DC

## 2018-11-17 MED ORDER — ALBUTEROL SULFATE HFA 108 (90 BASE) MCG/ACT IN AERS: 2.0000 | INHALATION_SPRAY | Freq: Once | RESPIRATORY_TRACT | Status: DC

## 2018-11-17 NOTE — ED Triage Notes (Signed)
Pt arrives POV for eval of SOB/CP. Pt reports CP x 1.5 mos, states no sick contacts. Pt also c/o 2 weeks of vag bleeding which she reports "isn't like a normal period".

## 2018-11-17 NOTE — ED Notes (Addendum)
Pt would like an inhaler also -- out of meds-- regular meds-- has celexa, states is in a state of confusion, and angry --  Also c/o vaginal bleeding-- abnormal for pt, normal period beginning of May, started bleeding over a week ago, passing clots.

## 2018-11-17 NOTE — ED Provider Notes (Signed)
[facility] EMERGENCY DEPARTMENT Provider Note   CSN: [id number] Arrival date & time: 11/17/18  1453    History   Chief Complaint Chief Complaint  Patient presents with  . Chest Pain  . Shortness of Breath    HPI [name] is a 48 y.o. female.     48 yo F with a chief complaints of problems with her mental health.  Patient states that she has been unable to fill her Risperdal and has had issues where she feels very sad and depressed and has trouble getting out of bed.  She also feels that she has been having trouble breathing.  That has been going on for about a month.  Nothing seems to make that better or worse.  Not exertional having some chest pain that she has trouble describing with it.  No significant change in the past month.  Eventually came today because her roommate and told her she needed to go because she was continuing to have trouble getting out of bed due to depression.  She also has been having vaginal bleeding that she describes as her menstrual cycle for the past couple weeks.  This is longer than normal.  Denies likelihood of being pregnant.  Denies pelvic pain.  The history is provided by the patient.  Chest Pain  Associated symptoms: shortness of breath   Associated symptoms: no dizziness, no fever, no headache, no nausea, no palpitations and no vomiting   Shortness of Breath  Associated symptoms: chest pain   Associated symptoms: no fever, no headaches, no vomiting and no wheezing   Illness  Severity:  Moderate Onset quality:  Gradual Duration:  1 month Timing:  Constant Progression:  Worsening Chronicity:  New Associated symptoms: chest pain and shortness of breath   Associated symptoms: no congestion, no fever, no headaches, no myalgias, no nausea, no rhinorrhea, no vomiting and no wheezing     Past Medical History:  Diagnosis Date  . Acid reflux   . Anxiety   . Asthma   . Hypertension   . Manic-depressive disorder ([place])    . PTSD (post-traumatic stress disorder)     Patient Active Problem List   Diagnosis Date Noted  . Bipolar 1 disorder ([place]) 10/20/2013  . Suicidal ideations 10/20/2013  . Polysubstance abuse ([place]) 10/20/2013    Past Surgical History:  Procedure Laterality Date  . TUBAL LIGATION       OB History   No obstetric history on file.      Home Medications    Prior to Admission medications   Medication Sig Start Date End Date Taking? Authorizing Provider  albuterol (PROVENTIL HFA;VENTOLIN HFA) 108 (90 BASE) MCG/ACT inhaler Inhale 2 puffs into the lungs every 6 (six) hours as needed. For asthma relief    [provider]  clonazePAM (KLONOPIN) 0.5 MG tablet Take 0.5 mg by mouth 3 (three) times daily as needed for anxiety.    [provider]  erythromycin ophthalmic ointment Place a 1/2 inch ribbon of ointment into the lower eyelid. 06/19/18   Khatri, Hina, PA-C  HYDROcodone-acetaminophen (NORCO/VICODIN) 5-325 MG per tablet Take 1-2 tablets by mouth every 6 (six) hours as needed for moderate pain. 01/04/14   Lajean Saver, MD  Lurasidone HCl 20 MG TABS Take 20 mg by mouth every morning.  10/23/13   [provider]  Multiple Vitamin (MULTIVITAMIN WITH MINERALS) TABS tablet Take 1 tablet by mouth every morning.    [provider]  pantoprazole ([place])  40 MG tablet Take 40 mg by mouth daily.  10/23/13 10/23/14  [provider]  vitamin E 400 UNIT capsule Take 400 Units by mouth every morning.    [provider]    Family History History reviewed. No pertinent family history.  Social History Social History   Tobacco Use  . Smoking status: Current Every Day Smoker    Packs/day: 0.50  . Smokeless tobacco: Never Used  Substance Use Topics  . Alcohol use: Yes    Comment: ocassionally  . Drug use: No    Comment: states that she does not use drugs anymore     Allergies   Patient has no known allergies.   Review of Systems  Review of Systems  Constitutional: Negative for chills and fever.  HENT: Negative for congestion and rhinorrhea.   Eyes: Negative for redness and visual disturbance.  Respiratory: Positive for shortness of breath. Negative for wheezing.   Cardiovascular: Positive for chest pain. Negative for palpitations.  Gastrointestinal: Negative for nausea and vomiting.  Genitourinary: Positive for vaginal bleeding. Negative for dysuria and urgency.  Musculoskeletal: Negative for arthralgias and myalgias.  Skin: Negative for pallor and wound.  Neurological: Negative for dizziness and headaches.  Psychiatric/Behavioral: Positive for agitation, behavioral problems and dysphoric mood. Negative for suicidal ideas. The patient is nervous/anxious.      Physical Exam Updated Vital Signs BP (!) 133/96   Pulse 84   Temp 98.3 F (36.8 C) (Oral)   Resp 17   Ht 5\' 2"  (1.575 m)   Wt 54.4 kg   SpO2 99%   BMI 21.95 kg/m   Physical Exam Vitals signs and nursing note reviewed.  Constitutional:      General: She is not in acute distress.    Appearance: She is well-developed. She is not diaphoretic.  HENT:     Head: Normocephalic and atraumatic.  Eyes:     Pupils: Pupils are equal, round, and reactive to light.  Neck:     Musculoskeletal: Normal range of motion and neck supple.  Cardiovascular:     Rate and Rhythm: Normal rate and regular rhythm.     Heart sounds: No murmur. No friction rub. No gallop.   Pulmonary:     Effort: Pulmonary effort is normal.     Breath sounds: No wheezing or rales.  Abdominal:     General: There is no distension.     Palpations: Abdomen is soft.     Tenderness: There is no abdominal tenderness.  Musculoskeletal:        General: No tenderness.  Skin:    General: Skin is warm and dry.  Neurological:     Mental Status: She is alert and oriented to person, place, and time.  Psychiatric:        Behavior: Behavior normal.      ED Treatments / Results  Labs (all  labs ordered are listed, but only abnormal results are displayed) Labs Reviewed  CBC - Abnormal; Notable for the following components:      Result Value   WBC 10.6 (*)    All other components within normal limits  BASIC METABOLIC PANEL  TROPONIN I  I-STAT BETA HCG BLOOD, ED ([facility], [facility], [facility] ONLY)    EKG EKG Interpretation  Date/Time:  Sunday Nov 17 2018 15:13:41 EDT Ventricular Rate:  89 PR Interval:  126 QRS Duration: 82 QT Interval:  404 QTC Calculation: 491 R Axis:   73 Text Interpretation:  Normal sinus rhythm Biatrial enlargement Prolonged  QT Abnormal ECG No significant change since last tracing Confirmed by Deno Etienne [phone]) on 11/17/2018 4:40:12 PM   Radiology Dg Chest 2 View  Result Date: 11/17/2018 CLINICAL DATA:  Chest pain and difficulty breathing EXAM: CHEST - 2 VIEW COMPARISON:  August 26, 2013 FINDINGS: Lungs are clear. Heart size and pulmonary vascularity are normal. No adenopathy. No bone lesions. No pneumothorax. There is a monitor lead overlying the upper left chest. IMPRESSION: No edema or consolidation. Electronically Signed   By: Lowella Grip III M.D.   On: 11/17/2018 16:14    Procedures Procedures (including critical care time)  Medications Ordered in ED Medications  sodium chloride flush (NS) 0.9 % injection 3 mL (has no administration in time range)  albuterol (VENTOLIN HFA) 108 (90 Base) MCG/ACT inhaler 2 puff (has no administration in time range)     Initial Impression / Assessment and Plan / ED Course  I have reviewed the triage vital signs and the nursing notes.  Pertinent labs & imaging results that were available during my care of the patient were reviewed by me and considered in my medical decision making (see chart for details).        48 yo F with a chief complaint of shortness of breath.  Nothing seems to make this better or worse.  She has a troponin is negative and EKG without significant finding.  Chest x-ray viewed by me without  focal infiltrate or pneumothorax.  I had offered to have psychiatry evaluate the patient which she initially agreed to and then left prior to receiving paperwork or further instruction.  I had cleared her medically if any issues and I feel that she needed a second troponin without any significant change greater than 6 hours.  She is not pregnant and her hemoglobin is normal.  I suspect she has dysfunctional uterine bleeding.  Suggest that she follow-up with OB/GYN.  She did not endorse any active hallucinations any suicidal or homicidal ideation.  I did not feel that she needed to be IVC'ed.     Medications given during this visit Medications  sodium chloride flush (NS) 0.9 % injection 3 mL (has no administration in time range)  albuterol (VENTOLIN HFA) 108 (90 Base) MCG/ACT inhaler 2 puff (has no administration in time range)     The patient appears reasonably screen and/or stabilized for discharge and I doubt any other medical condition or other [facility] requiring further screening, evaluation, or treatment in the ED at this time prior to discharge.    Final Clinical Impressions(s) / ED Diagnoses   Final diagnoses:  None    ED Discharge Orders    None       Deno Etienne, DO 11/17/18 1953

## 2018-11-17 NOTE — ED Triage Notes (Signed)
Attempted to get EKG for c/o CP and pt left to smoke cigarette and said she would return

## 2019-02-08 DIAGNOSIS — F17200 Nicotine dependence, unspecified, uncomplicated: Secondary | ICD-10-CM | POA: Diagnosis present

## 2019-02-08 DIAGNOSIS — F1220 Cannabis dependence, uncomplicated: Secondary | ICD-10-CM | POA: Diagnosis present

## 2019-02-08 DIAGNOSIS — F1020 Alcohol dependence, uncomplicated: Secondary | ICD-10-CM | POA: Diagnosis present

## 2019-05-05 DIAGNOSIS — F32A Depression, unspecified: Secondary | ICD-10-CM | POA: Insufficient documentation

## 2019-05-07 DIAGNOSIS — I10 Essential (primary) hypertension: Secondary | ICD-10-CM | POA: Diagnosis present

## 2019-05-16 ENCOUNTER — Encounter (HOSPITAL_BASED_OUTPATIENT_CLINIC_OR_DEPARTMENT_OTHER): Payer: Self-pay

## 2019-05-16 ENCOUNTER — Other Ambulatory Visit: Payer: Self-pay

## 2019-05-16 ENCOUNTER — Emergency Department (HOSPITAL_BASED_OUTPATIENT_CLINIC_OR_DEPARTMENT_OTHER)
Admission: EM | Admit: 2019-05-16 | Discharge: 2019-05-16 | Disposition: A | Discharge status: Home / Self Care | Payer: Medicare Other | Attending: Emergency Medicine | Admitting: Emergency Medicine

## 2019-05-16 ENCOUNTER — Emergency Department (HOSPITAL_BASED_OUTPATIENT_CLINIC_OR_DEPARTMENT_OTHER): Payer: Medicare Other

## 2019-05-16 DIAGNOSIS — R05 Cough: Secondary | ICD-10-CM | POA: Diagnosis present

## 2019-05-16 DIAGNOSIS — J45901 Unspecified asthma with (acute) exacerbation: Secondary | ICD-10-CM | POA: Diagnosis not present

## 2019-05-16 DIAGNOSIS — Z20828 Contact with and (suspected) exposure to other viral communicable diseases: Secondary | ICD-10-CM | POA: Insufficient documentation

## 2019-05-16 DIAGNOSIS — J069 Acute upper respiratory infection, unspecified: Secondary | ICD-10-CM | POA: Insufficient documentation

## 2019-05-16 DIAGNOSIS — I10 Essential (primary) hypertension: Secondary | ICD-10-CM | POA: Diagnosis not present

## 2019-05-16 DIAGNOSIS — F17210 Nicotine dependence, cigarettes, uncomplicated: Secondary | ICD-10-CM | POA: Diagnosis not present

## 2019-05-16 HISTORY — DX: Alcohol abuse, uncomplicated: F10.10

## 2019-05-16 HISTORY — DX: Other psychoactive substance abuse, uncomplicated: F19.10

## 2019-05-16 LAB — SARS CORONAVIRUS 2 AG (30 MIN TAT): SARS Coronavirus 2 Ag: NEGATIVE

## 2019-05-16 MED ORDER — FLUTICASONE PROPIONATE 50 MCG/ACT NA SUSP: 1.0000 | Freq: Every day | NASAL | 0 refills | Status: DC

## 2019-05-16 NOTE — ED Provider Notes (Signed)
[facility] EMERGENCY DEPARTMENT Provider Note   CSN: [id number] Arrival date & time: 05/16/19  1317     History   Chief Complaint Chief Complaint  Patient presents with  . Cough    HPI [name] is a 48 y.o. female with hx of ETOH and cocaine abuse who presents with a "head cold". The patient is currently a patient at [facility] residential program. She received an outpatient COVID test prior to being admitted and it was negative. Yesterday was her first day. The nurse decided to send her to the ED today because she was reporting symptoms of a runny nose and shortness of breath. The patient also reports a headache and nasal congestion. She denies fever, chills, body aches, chest pain, SOB, cough to me. She has an inhaler which helps her symptoms.     HPI  Past Medical History:  Diagnosis Date  . Acid reflux   . Anxiety   . Asthma   . Drug abuse ([place])   . ETOH abuse   . Hypertension   . Manic-depressive disorder ([place])   . PTSD (post-traumatic stress disorder)     Patient Active Problem List   Diagnosis Date Noted  . Bipolar 1 disorder ([place]) 10/20/2013  . Suicidal ideations 10/20/2013  . Polysubstance abuse ([place]) 10/20/2013    Past Surgical History:  Procedure Laterality Date  . TUBAL LIGATION       OB History   No obstetric history on file.      Home Medications    Prior to Admission medications   Medication Sig Start Date End Date Taking? Authorizing Provider  albuterol (PROVENTIL HFA;VENTOLIN HFA) 108 (90 BASE) MCG/ACT inhaler Inhale 2 puffs into the lungs every 6 (six) hours as needed. For asthma relief    [provider]  clonazePAM (KLONOPIN) 0.5 MG tablet Take 0.5 mg by mouth 3 (three) times daily as needed for anxiety.    [provider]  erythromycin ophthalmic ointment Place a 1/2 inch ribbon of ointment into the lower eyelid. 06/19/18   Khatri, Hina, PA-C  HYDROcodone-acetaminophen (NORCO/VICODIN) 5-325 MG per  tablet Take 1-2 tablets by mouth every 6 (six) hours as needed for moderate pain. 01/04/14   Lajean Saver, MD  Lurasidone HCl 20 MG TABS Take 20 mg by mouth every morning.  10/23/13   [provider]  Multiple Vitamin (MULTIVITAMIN WITH MINERALS) TABS tablet Take 1 tablet by mouth every morning.    [provider]  pantoprazole (PROTONIX) 40 MG tablet Take 40 mg by mouth daily.  10/23/13 10/23/14  [provider]  vitamin E 400 UNIT capsule Take 400 Units by mouth every morning.    [provider]    Family History No family history on file.  Social History Social History   Tobacco Use  . Smoking status: Current Every Day Smoker    Packs/day: 0.50  . Smokeless tobacco: Never Used  . Tobacco comment: in rehab  Substance Use Topics  . Alcohol use: Not Currently    Comment: in rehab  . Drug use: No    Comment: in rehab     Allergies   Benadryl [diphenhydramine]   Review of Systems Review of Systems  Constitutional: Negative for fever.  HENT: Positive for congestion and rhinorrhea. Negative for sore throat.   Respiratory: Negative for cough and shortness of breath.   Cardiovascular: Negative for chest pain.  Neurological: Positive for headaches.     Physical Exam Updated Vital Signs BP [name])  153/90 (BP Location: Left Arm)   Pulse 83   Temp 99.1 F (37.3 C) (Oral)   Resp 16   Ht 5\' 2"  (1.575 m)   Wt 52.6 kg   LMP 05/02/2019   SpO2 100%   BMI 21.22 kg/m   Physical Exam Vitals signs and nursing note reviewed.  Constitutional:      General: She is not in acute distress.    Appearance: Normal appearance. She is well-developed. She is not ill-appearing.  HENT:     Head: Normocephalic and atraumatic.     Right Ear: Tympanic membrane normal.     Left Ear: Tympanic membrane normal.     Nose: Rhinorrhea present.     Mouth/Throat:     Mouth: Mucous membranes are moist.  Eyes:     General: No scleral icterus.       Right eye: No  discharge.        Left eye: No discharge.     Conjunctiva/sclera: Conjunctivae normal.     Pupils: Pupils are equal, round, and reactive to light.  Neck:     Musculoskeletal: Normal range of motion.  Cardiovascular:     Rate and Rhythm: Normal rate and regular rhythm.  Pulmonary:     Effort: Pulmonary effort is normal. No respiratory distress.     Breath sounds: Normal breath sounds.  Abdominal:     General: There is no distension.  Skin:    General: Skin is warm and dry.  Neurological:     Mental Status: She is alert and oriented to person, place, and time.  Psychiatric:        Behavior: Behavior normal.      ED Treatments / Results  Labs (all labs ordered are listed, but only abnormal results are displayed) Labs Reviewed - No data to display  EKG None  Radiology No results found.  Procedures Procedures (including critical care time)  Medications Ordered in ED Medications - No data to display   Initial Impression / Assessment and Plan / ED Course  I have reviewed the triage vital signs and the nursing notes.  Pertinent labs & imaging results that were available during my care of the patient were reviewed by me and considered in my medical decision making (see chart for details).  48 year old female presents with URI symptoms since yesterday. She is hypertensive but otherwise vitals are normal. Her exam is remarkable for a runny nose but is otherwise reassuring. CXR was obtained and it's normal. POC COVID testing was performed and is negative. Will d/c back to [facility] with symptomatic treatment.  [name] was evaluated in Emergency Department on 05/16/2019 for the symptoms described in the history of present illness. She was evaluated in the context of the global COVID-19 pandemic, which necessitated consideration that the patient might be at risk for infection with the SARS-CoV-2 virus that causes COVID-19. Institutional protocols and algorithms that pertain  to the evaluation of patients at risk for COVID-19 are in a state of rapid change based on information released by regulatory bodies including the CDC and federal and state organizations. These policies and algorithms were followed during the patient's care in the ED.   Final Clinical Impressions(s) / ED Diagnoses   Final diagnoses:  Viral URI with cough    ED Discharge Orders    None       Recardo Evangelist, PA-C 05/16/19 [place], Wenda [place], MD 05/19/19 [phone]

## 2019-05-16 NOTE — ED Triage Notes (Signed)
Pt c/o flu like sx x 2 days-states she is at [facility] day 2 for rehab-NAD-steady gait

## 2019-05-16 NOTE — Discharge Instructions (Signed)
Take Tylenol as needed for fever or body aches Use Flonase for nasal congestion Use inhaler as needed for shortness of breath Please return if worsening

## 2019-10-12 IMAGING — DX CHEST - 2 VIEW
2 series · 2 of 2 positions shown · non-contrast
Comparison: August 26, 2013

CLINICAL DATA: Chest pain and difficulty breathing

EXAM:
CHEST - 2 VIEW

[chest pa]
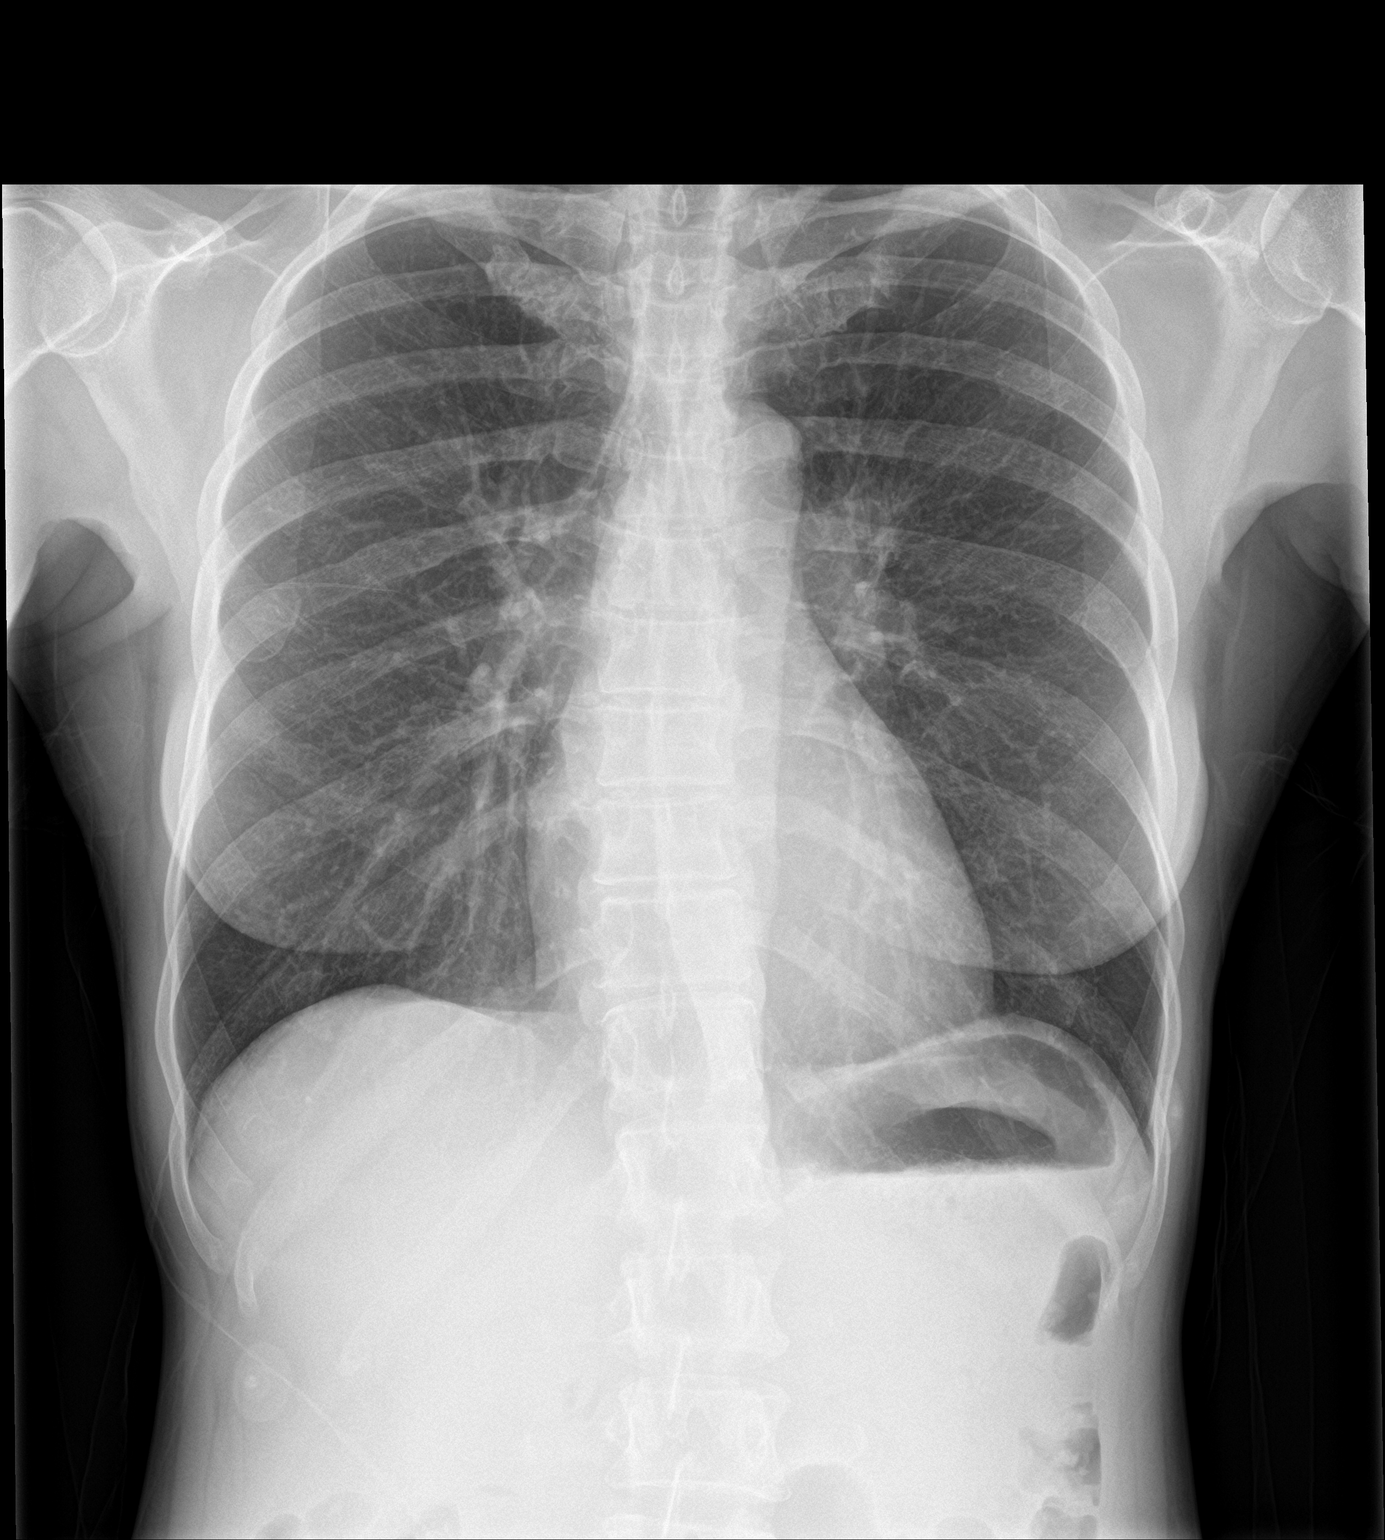

[chest lat]
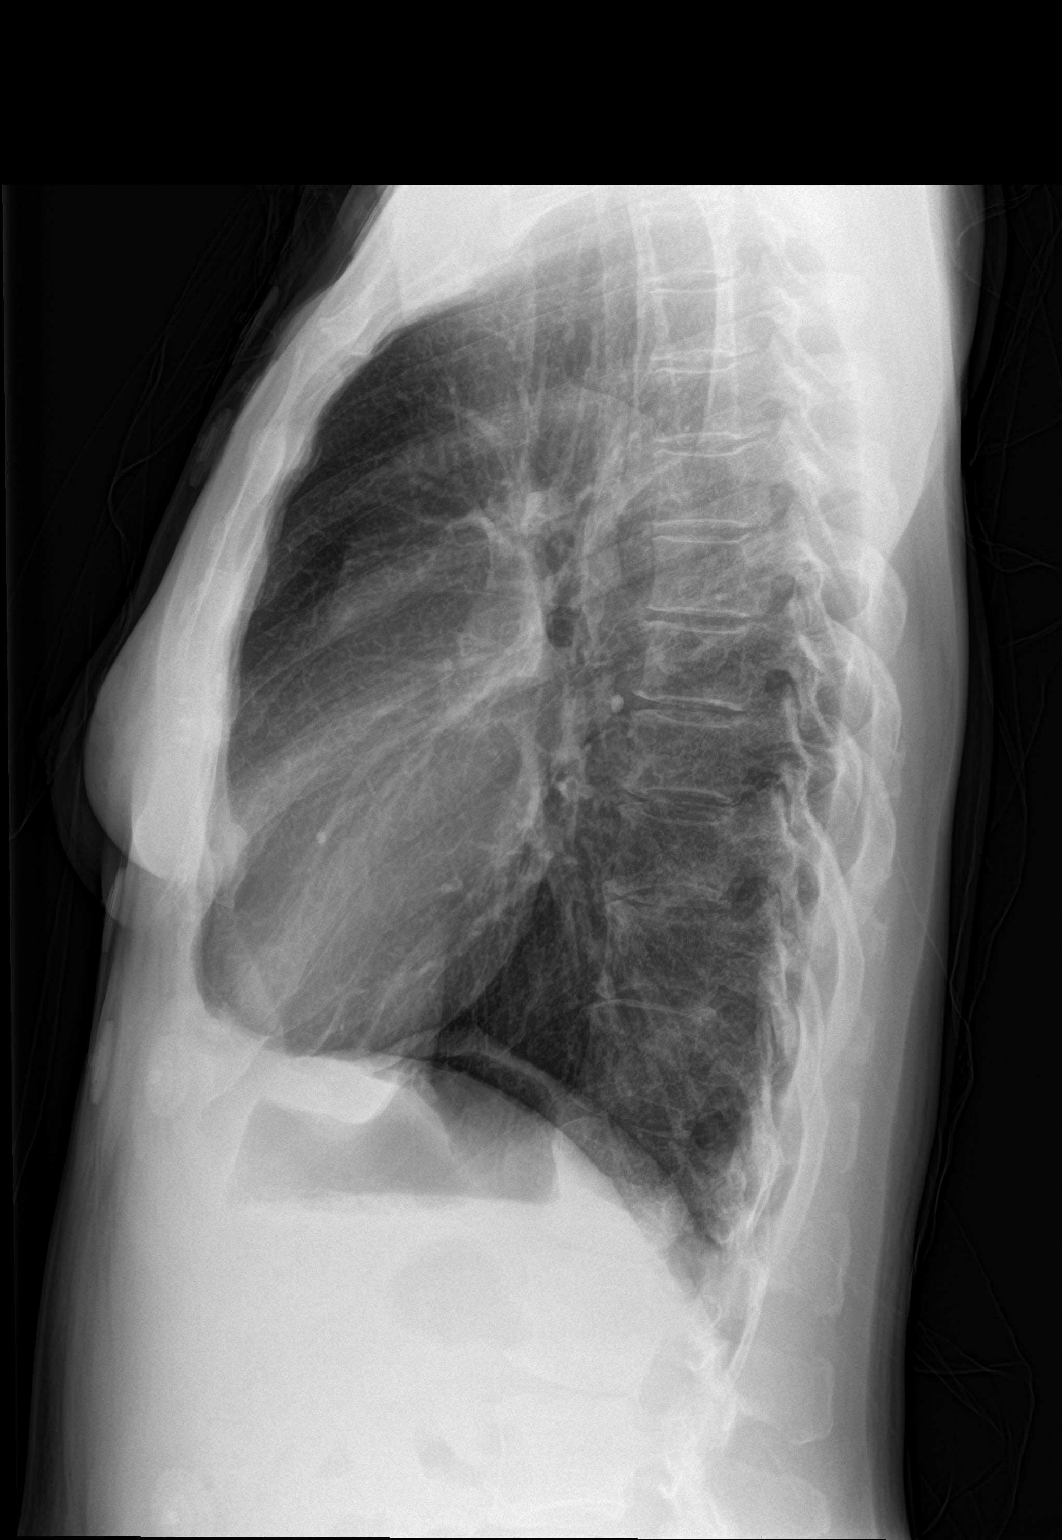

[2 of 2 positions shown; findings below may reference images not displayed]

FINDINGS: Lungs are clear. Heart size and pulmonary vascularity are normal. No
adenopathy. No bone lesions. No pneumothorax. There is a monitor
lead overlying the upper left chest.
IMPRESSION: No edema or consolidation.

## 2020-04-06 ENCOUNTER — Ambulatory Visit (HOSPITAL_COMMUNITY)
Admission: EM | Admit: 2020-04-06 | Discharge: 2020-04-06 | Disposition: A | Discharge status: Home / Self Care | Payer: Medicare Other | Attending: Emergency Medicine | Admitting: Emergency Medicine

## 2020-04-06 ENCOUNTER — Other Ambulatory Visit: Payer: Self-pay

## 2020-04-06 ENCOUNTER — Encounter (HOSPITAL_COMMUNITY): Payer: Self-pay

## 2020-04-06 DIAGNOSIS — F32A Depression, unspecified: Secondary | ICD-10-CM | POA: Diagnosis not present

## 2020-04-06 DIAGNOSIS — R0981 Nasal congestion: Secondary | ICD-10-CM | POA: Diagnosis not present

## 2020-04-06 DIAGNOSIS — J4520 Mild intermittent asthma, uncomplicated: Secondary | ICD-10-CM | POA: Diagnosis not present

## 2020-04-06 DIAGNOSIS — F419 Anxiety disorder, unspecified: Secondary | ICD-10-CM | POA: Insufficient documentation

## 2020-04-06 DIAGNOSIS — Z20822 Contact with and (suspected) exposure to covid-19: Secondary | ICD-10-CM | POA: Insufficient documentation

## 2020-04-06 DIAGNOSIS — F17210 Nicotine dependence, cigarettes, uncomplicated: Secondary | ICD-10-CM | POA: Insufficient documentation

## 2020-04-06 DIAGNOSIS — Z79899 Other long term (current) drug therapy: Secondary | ICD-10-CM | POA: Diagnosis not present

## 2020-04-06 DIAGNOSIS — R0602 Shortness of breath: Secondary | ICD-10-CM | POA: Diagnosis present

## 2020-04-06 DIAGNOSIS — Z8249 Family history of ischemic heart disease and other diseases of the circulatory system: Secondary | ICD-10-CM | POA: Insufficient documentation

## 2020-04-06 DIAGNOSIS — I10 Essential (primary) hypertension: Secondary | ICD-10-CM | POA: Diagnosis not present

## 2020-04-06 DIAGNOSIS — Z888 Allergy status to other drugs, medicaments and biological substances status: Secondary | ICD-10-CM | POA: Diagnosis not present

## 2020-04-06 LAB — SARS CORONAVIRUS 2 (TAT 6-24 HRS): SARS Coronavirus 2: NEGATIVE

## 2020-04-06 MED ORDER — ALBUTEROL SULFATE HFA 108 (90 BASE) MCG/ACT IN AERS: 1.0000 | INHALATION_SPRAY | Freq: Four times a day (QID) | RESPIRATORY_TRACT | 0 refills | Status: DC | PRN

## 2020-04-06 MED ORDER — CETIRIZINE HCL 10 MG PO CAPS: 10.0000 mg | ORAL_CAPSULE | Freq: Every day | ORAL | 0 refills | Status: DC

## 2020-04-06 NOTE — Discharge Instructions (Signed)
Inhaler refilled, use as needed Daily cetirizine for congestion and drainage Follow up if not improving or worsening

## 2020-04-06 NOTE — ED Provider Notes (Signed)
[facility]    CSN: [id number] Arrival date & time: 04/06/20  2878      History   Chief Complaint Chief Complaint  Patient presents with   Shortness of Breath    HPI [name] is a 49 y.o. female presenting today for shortness of breath history of asthma, hypertension, substance abuse, presenting today for evaluation of shortness of breath.  Patient reports over the past few days she has had tearing of her asthma.  Has felt some rattling and wheezing in her chest which has been off and on.  She has had a mild occasional cough and some nasal congestion.  Denies fevers or close sick contacts.  Does report increase shortness of breath with exertion.  Has been out of her albuterol inhaler for 3 months, typically asthma is controlled with albuterol alone.  HPI  Past Medical History:  Diagnosis Date   Acid reflux    Anxiety    Asthma    Drug abuse ([place])    ETOH abuse    Hypertension    Manic-depressive disorder ([place])    PTSD (post-traumatic stress disorder)     Patient Active Problem List   Diagnosis Date Noted   Bipolar 1 disorder ([place]) 10/20/2013   Suicidal ideations 10/20/2013   Polysubstance abuse ([place]) 10/20/2013    Past Surgical History:  Procedure Laterality Date   TUBAL LIGATION      OB History   No obstetric history on file.      Home Medications    Prior to Admission medications   Medication Sig Start Date End Date Taking? Authorizing Provider  sertraline (ZOLOFT) 50 MG tablet Take by mouth. 05/09/19  Yes [provider]  albuterol (VENTOLIN HFA) 108 (90 Base) MCG/ACT inhaler Inhale 1-2 puffs into the lungs every 6 (six) hours as needed for wheezing or shortness of breath. 04/06/20   Red Mandt C, PA-C  Cetirizine HCl 10 MG CAPS Take 1 capsule (10 mg total) by mouth daily. 04/06/20   Cledith Kamiya C, PA-C  erythromycin ophthalmic ointment Place a 1/2 inch ribbon of ointment into the lower eyelid. 06/19/18    Khatri, Hina, PA-C  fluticasone (FLONASE) 50 MCG/ACT nasal spray Place 1 spray into both nostrils daily. 05/16/19   Recardo Evangelist, PA-C  Lurasidone HCl 20 MG TABS Take 20 mg by mouth every morning.  10/23/13   [provider]  clonazePAM (KLONOPIN) 0.5 MG tablet Take 0.5 mg by mouth 3 (three) times daily as needed for anxiety.  04/06/20  [provider]  pantoprazole (PROTONIX) 40 MG tablet Take 40 mg by mouth daily.  10/23/13 04/06/20  [provider]    Family History Family History  Problem Relation Age of Onset   Diabetes Mother    Hypertension Mother     Social History Social History   Tobacco Use   Smoking status: Current Every Day Smoker    Packs/day: 0.50   Smokeless tobacco: Never Used   Tobacco comment: in rehab  Vaping Use   Vaping Use: Never used  Substance Use Topics   Alcohol use: Yes    Comment: in rehab   Drug use: No    Comment: in rehab     Allergies   Benadryl [diphenhydramine]   Review of Systems Review of Systems  Constitutional: Negative for activity change, appetite change, chills, fatigue and fever.  HENT: Positive for congestion and rhinorrhea. Negative for ear pain, sinus pressure, sore throat and trouble swallowing.   Eyes:  Negative for discharge and redness.  Respiratory: Positive for cough, shortness of breath and wheezing. Negative for chest tightness.   Cardiovascular: Negative for chest pain.  Gastrointestinal: Negative for abdominal pain, diarrhea, nausea and vomiting.  Musculoskeletal: Negative for myalgias.  Skin: Negative for rash.  Neurological: Negative for dizziness, light-headedness and headaches.     Physical Exam Triage Vital Signs ED Triage Vitals  Enc Vitals Group     BP      Pulse      Resp      Temp      Temp src      SpO2      Weight      Height      Head Circumference      Peak Flow      Pain Score      Pain Loc      Pain Edu?      Excl. in [place]?    No data  found.  Updated Vital Signs BP 107/61 (BP Location: Left Arm)    Pulse 82    Temp 98.7 F (37.1 C) (Oral)    Resp (!) 36    LMP 03/25/2020 (Exact Date)    SpO2 100%   Visual Acuity Right Eye Distance:   Left Eye Distance:   Bilateral Distance:    Right Eye Near:   Left Eye Near:    Bilateral Near:     Physical Exam Vitals and nursing note reviewed.  Constitutional:      Appearance: She is well-developed.     Comments: No acute distress  HENT:     Head: Normocephalic and atraumatic.     Ears:     Comments: Bilateral ears without tenderness to palpation of external auricle, tragus and mastoid, EAC's without erythema or swelling, TM's with good bony landmarks and cone of light. Non erythematous.     Nose: Nose normal.     Mouth/Throat:     Comments: Oral mucosa pink and moist, no tonsillar enlargement or exudate. Posterior pharynx patent and nonerythematous, no uvula deviation or swelling. Normal phonation. Eyes:     Conjunctiva/sclera: Conjunctivae normal.  Cardiovascular:     Rate and Rhythm: Normal rate.  Pulmonary:     Effort: Pulmonary effort is normal. No respiratory distress.     Comments: Breathing comfortably at rest, CTABL, no wheezing, rales or other adventitious sounds auscultated Abdominal:     General: There is no distension.  Musculoskeletal:        General: Normal range of motion.     Cervical back: Neck supple.  Skin:    General: Skin is warm and dry.  Neurological:     Mental Status: She is alert and oriented to person, place, and time.      UC Treatments / Results  Labs (all labs ordered are listed, but only abnormal results are displayed) Labs Reviewed  SARS CORONAVIRUS 2 (TAT 6-24 HRS)    EKG   Radiology No results found.  Procedures Procedures (including critical care time)  Medications Ordered in UC Medications - No data to display  Initial Impression / Assessment and Plan / UC Course  I have reviewed the triage vital signs and  the nursing notes.  Pertinent labs & imaging results that were available during my care of the patient were reviewed by me and considered in my medical decision making (see chart for details).     Exam reassuring, lungs clear to auscultation, refilling albuterol inhaler, deferring steroids at this  time given currently without wheezing and has typically had relief with albuterol alone.  Initiate on daily cetirizine to help with congestion and drainage.  Covid test pending for screening.  Discussed strict return precautions. Patient verbalized understanding and is agreeable with plan.  Final Clinical Impressions(s) / UC Diagnoses   Final diagnoses:  Shortness of breath  Nasal congestion  Mild intermittent asthma without complication     Discharge Instructions     Inhaler refilled, use as needed Daily cetirizine for congestion and drainage Follow up if not improving or worsening    ED Prescriptions    Medication Sig Dispense Auth. Provider   albuterol (VENTOLIN HFA) 108 (90 Base) MCG/ACT inhaler Inhale 1-2 puffs into the lungs every 6 (six) hours as needed for wheezing or shortness of breath. 18 g Shebra Muldrow C, PA-C   Cetirizine HCl 10 MG CAPS Take 1 capsule (10 mg total) by mouth daily. 15 capsule Anyah Swallow, [place] C, PA-C     PDMP not reviewed this encounter.   Janith Lima, [place] 04/06/20 [phone]

## 2020-04-06 NOTE — ED Triage Notes (Addendum)
Patient in with c/o sob and congestion for the last few days. States she thinks it due to the seasons changing  Has hx of asthma since a child and tobacco use  Denies fever, cough, n/v, diarrhea, or other uri symptoms  Has had J&J covid vaccine last week

## 2020-09-16 ENCOUNTER — Other Ambulatory Visit: Payer: Self-pay | Admitting: Gastroenterology

## 2020-09-16 DIAGNOSIS — R1310 Dysphagia, unspecified: Secondary | ICD-10-CM

## 2021-04-11 ENCOUNTER — Ambulatory Visit (HOSPITAL_COMMUNITY)
Admission: EM | Admit: 2021-04-11 | Discharge: 2021-04-11 | Disposition: A | Discharge status: Home / Self Care | Payer: Medicare Other | Attending: Internal Medicine | Admitting: Internal Medicine

## 2021-04-11 ENCOUNTER — Other Ambulatory Visit: Payer: Self-pay

## 2021-04-11 ENCOUNTER — Encounter (HOSPITAL_COMMUNITY): Payer: Self-pay

## 2021-04-11 DIAGNOSIS — Z20822 Contact with and (suspected) exposure to covid-19: Secondary | ICD-10-CM | POA: Insufficient documentation

## 2021-04-11 DIAGNOSIS — J209 Acute bronchitis, unspecified: Secondary | ICD-10-CM | POA: Diagnosis present

## 2021-04-11 LAB — SARS CORONAVIRUS 2 (TAT 6-24 HRS): SARS Coronavirus 2: NEGATIVE

## 2021-04-11 MED ORDER — MONTELUKAST SODIUM 10 MG PO TABS: 10.0000 mg | ORAL_TABLET | Freq: Every day | ORAL | 1 refills | Status: DC

## 2021-04-11 MED ORDER — GUAIFENESIN ER 600 MG PO TB12: 600.0000 mg | ORAL_TABLET | Freq: Two times a day (BID) | ORAL | Status: DC

## 2021-04-11 MED ORDER — ALBUTEROL SULFATE HFA 108 (90 BASE) MCG/ACT IN AERS: 1.0000 | INHALATION_SPRAY | Freq: Four times a day (QID) | RESPIRATORY_TRACT | 0 refills | Status: DC | PRN

## 2021-04-11 MED ORDER — BENZONATATE 100 MG PO CAPS: 100.0000 mg | ORAL_CAPSULE | Freq: Three times a day (TID) | ORAL | 0 refills | Status: DC

## 2021-04-11 NOTE — Discharge Instructions (Addendum)
Please take medications as prescribed If you have worsening symptoms please return to urgent care to be reevaluated We will call you with recommendations if labs are abnormal.

## 2021-04-11 NOTE — ED Triage Notes (Signed)
Pt presents with runny nose, nasal congestion, cough X 3 days.

## 2021-04-13 NOTE — ED Provider Notes (Signed)
[facility]    CSN: [id number] Arrival date & time: 04/11/21  1304      History   Chief Complaint Chief Complaint  Patient presents with   Headache    HPI CANDELA KRUL is a 50 y.o. female comes to the urgent care with a 3-day history of headache, runny nose, nasal congestion and a cough.  Patient's cough is nonproductive and is associated with intermittent wheezing.  Patient's symptoms started insidiously and has been persistent.  Patient has generalized body aches.  No sick contacts.  No nausea, vomiting or diarrhea.  Patient is afebrile on presentation to the urgent care.   HPI  Past Medical History:  Diagnosis Date   Acid reflux    Anxiety    Asthma    Drug abuse ([place])    ETOH abuse    Hypertension    Manic-depressive disorder ([place])    PTSD (post-traumatic stress disorder)     Patient Active Problem List   Diagnosis Date Noted   Bipolar 1 disorder ([place]) 10/20/2013   Suicidal ideations 10/20/2013   Polysubstance abuse ([place]) 10/20/2013    Past Surgical History:  Procedure Laterality Date   TUBAL LIGATION      OB History   No obstetric history on file.      Home Medications    Prior to Admission medications   Medication Sig Start Date End Date Taking? Authorizing Provider  albuterol (VENTOLIN HFA) 108 (90 Base) MCG/ACT inhaler Inhale 1-2 puffs into the lungs every 6 (six) hours as needed for wheezing or shortness of breath. 04/11/21  Yes Shelvie Salsberry, Myrene Galas, MD  benzonatate (TESSALON) 100 MG capsule Take 1 capsule (100 mg total) by mouth every 8 (eight) hours. 04/11/21  Yes Jozalyn Baglio, Myrene Galas, MD  guaiFENesin (MUCINEX) 600 MG 12 hr tablet Take 1 tablet (600 mg total) by mouth 2 (two) times daily. 04/11/21  Yes Jaydeen Darley, Myrene Galas, MD  montelukast (SINGULAIR) 10 MG tablet Take 1 tablet (10 mg total) by mouth at bedtime. 04/11/21  Yes Shelvia Fojtik, Myrene Galas, MD  Cetirizine HCl 10 MG CAPS Take 1 capsule (10 mg total) by mouth daily. 04/06/20   Wieters,  Hallie C, PA-C  erythromycin ophthalmic ointment Place a 1/2 inch ribbon of ointment into the lower eyelid. 06/19/18   Khatri, Hina, PA-C  fluticasone (FLONASE) 50 MCG/ACT nasal spray Place 1 spray into both nostrils daily. 05/16/19   Recardo Evangelist, PA-C  Lurasidone HCl 20 MG TABS Take 20 mg by mouth every morning.  10/23/13   [provider]  sertraline (ZOLOFT) 50 MG tablet Take by mouth. 05/09/19   [provider]  clonazePAM (KLONOPIN) 0.5 MG tablet Take 0.5 mg by mouth 3 (three) times daily as needed for anxiety.  04/06/20  [provider]  pantoprazole (PROTONIX) 40 MG tablet Take 40 mg by mouth daily.  10/23/13 04/06/20  [provider]    Family History Family History  Problem Relation Age of Onset   Diabetes Mother    Hypertension Mother     Social History Social History   Tobacco Use   Smoking status: Every Day    Packs/day: 0.50    Types: Cigarettes   Smokeless tobacco: Never   Tobacco comments:    in rehab  Vaping Use   Vaping Use: Never used  Substance Use Topics   Alcohol use: Yes    Comment: in rehab   Drug use: No    Comment: in rehab  Allergies   Benadryl [diphenhydramine]   Review of Systems Review of Systems  Constitutional:  Negative for chills, fatigue and fever.  Respiratory: Negative.    Cardiovascular: Negative.   Gastrointestinal: Negative.   Neurological: Negative.     Physical Exam Triage Vital Signs ED Triage Vitals  Enc Vitals Group     BP 04/11/21 1533 117/84     Pulse Rate 04/11/21 1533 91     Resp 04/11/21 1533 19     Temp 04/11/21 1533 98.9 F (37.2 C)     Temp Source 04/11/21 1533 Oral     SpO2 04/11/21 1533 98 %     Weight --      Height --      Head Circumference --      Peak Flow --      Pain Score 04/11/21 1531 5     Pain Loc --      Pain Edu? --      Excl. in [place]? --    No data found.  Updated Vital Signs BP 117/84 (BP Location: Left Arm)   Pulse 91   Temp 98.9 F  (37.2 C) (Oral)   Resp 19   LMP 04/03/2021 (Approximate)   SpO2 98%   Visual Acuity Right Eye Distance:   Left Eye Distance:   Bilateral Distance:    Right Eye Near:   Left Eye Near:    Bilateral Near:     Physical Exam Vitals and nursing note reviewed.  Constitutional:      General: She is not in acute distress.    Appearance: She is not ill-appearing.  Cardiovascular:     Rate and Rhythm: Normal rate and regular rhythm.     Heart sounds: Normal heart sounds. No murmur heard.   No friction rub.  Pulmonary:     Effort: Pulmonary effort is normal.     Breath sounds: Wheezing present. No rhonchi or rales.  Musculoskeletal:     Cervical back: Normal range of motion.  Lymphadenopathy:     Cervical: No cervical adenopathy.  Neurological:     Mental Status: She is alert.     UC Treatments / Results  Labs (all labs ordered are listed, but only abnormal results are displayed) Labs Reviewed  SARS CORONAVIRUS 2 (TAT 6-24 HRS)    EKG   Radiology No results found.  Procedures Procedures (including critical care time)  Medications Ordered in UC Medications - No data to display  Initial Impression / Assessment and Plan / UC Course  I have reviewed the triage vital signs and the nursing notes.  Pertinent labs & imaging results that were available during my care of the patient were reviewed by me and considered in my medical decision making (see chart for details).     Acute bronchitis with bronchospasm: Tessalon Perles as needed for cough Albuterol inhaler as needed for shortness of breath/wheezing Singulair 10 mg orally daily COVID-19 PCR test has been sent We will call you with recommendations if labs are abnormal Return to urgent care if symptoms are abnormal  Final diagnoses:  Acute bronchitis, unspecified organism     Discharge Instructions      Please take medications as prescribed If you have worsening symptoms please return to urgent care to be  reevaluated We will call you with recommendations if labs are abnormal.   ED Prescriptions     Medication Sig Dispense Auth. Provider   montelukast (SINGULAIR) 10 MG tablet Take 1 tablet (10 mg total)  by mouth at bedtime. 60 tablet Tyrian Peart, Myrene Galas, MD   benzonatate (TESSALON) 100 MG capsule Take 1 capsule (100 mg total) by mouth every 8 (eight) hours. 21 capsule Ronique Simerly, Myrene Galas, MD   albuterol (VENTOLIN HFA) 108 (90 Base) MCG/ACT inhaler Inhale 1-2 puffs into the lungs every 6 (six) hours as needed for wheezing or shortness of breath. 18 g Chase Picket, MD   guaiFENesin (MUCINEX) 600 MG 12 hr tablet Take 1 tablet (600 mg total) by mouth 2 (two) times daily. -- Chase Picket, MD      PDMP not reviewed this encounter.   Chase Picket, MD 04/13/21 [phone]

## 2021-05-02 ENCOUNTER — Encounter (HOSPITAL_COMMUNITY): Payer: Self-pay

## 2021-05-02 ENCOUNTER — Emergency Department (HOSPITAL_COMMUNITY)
Admission: EM | Admit: 2021-05-02 | Discharge: 2021-05-03 | Disposition: A | Discharge status: Other Acute Inpatient Hospital | Payer: Medicare Other | Source: Home / Self Care | Attending: Emergency Medicine | Admitting: Emergency Medicine

## 2021-05-02 ENCOUNTER — Other Ambulatory Visit: Payer: Self-pay

## 2021-05-02 DIAGNOSIS — R45851 Suicidal ideations: Secondary | ICD-10-CM | POA: Insufficient documentation

## 2021-05-02 DIAGNOSIS — F17210 Nicotine dependence, cigarettes, uncomplicated: Secondary | ICD-10-CM | POA: Insufficient documentation

## 2021-05-02 DIAGNOSIS — I10 Essential (primary) hypertension: Secondary | ICD-10-CM | POA: Insufficient documentation

## 2021-05-02 DIAGNOSIS — F411 Generalized anxiety disorder: Secondary | ICD-10-CM | POA: Insufficient documentation

## 2021-05-02 DIAGNOSIS — J45909 Unspecified asthma, uncomplicated: Secondary | ICD-10-CM | POA: Insufficient documentation

## 2021-05-02 DIAGNOSIS — F1424 Cocaine dependence with cocaine-induced mood disorder: Secondary | ICD-10-CM | POA: Insufficient documentation

## 2021-05-02 DIAGNOSIS — Z20822 Contact with and (suspected) exposure to covid-19: Secondary | ICD-10-CM | POA: Insufficient documentation

## 2021-05-02 DIAGNOSIS — Y900 Blood alcohol level of less than 20 mg/100 ml: Secondary | ICD-10-CM | POA: Insufficient documentation

## 2021-05-02 DIAGNOSIS — Z7951 Long term (current) use of inhaled steroids: Secondary | ICD-10-CM | POA: Insufficient documentation

## 2021-05-02 DIAGNOSIS — F332 Major depressive disorder, recurrent severe without psychotic features: Secondary | ICD-10-CM | POA: Diagnosis not present

## 2021-05-02 MED ORDER — ALBUTEROL SULFATE HFA 108 (90 BASE) MCG/ACT IN AERS
1.0000 | INHALATION_SPRAY | Freq: Four times a day (QID) | RESPIRATORY_TRACT | Status: DC | PRN
  Administered 2021-05-03: 2 via RESPIRATORY_TRACT
  Filled 2021-05-02: qty 6.7

## 2021-05-02 MED ORDER — LORATADINE 10 MG PO TABS
10.0000 mg | ORAL_TABLET | Freq: Every day | ORAL | Status: DC
  Filled 2021-05-02: qty 1

## 2021-05-02 MED ORDER — LURASIDONE HCL 20 MG PO TABS: 20.0000 mg | ORAL_TABLET | Freq: Every morning | ORAL | Status: DC

## 2021-05-02 MED ORDER — MONTELUKAST SODIUM 10 MG PO TABS
10.0000 mg | ORAL_TABLET | Freq: Every day | ORAL | Status: DC
  Filled 2021-05-02 (×2): qty 1

## 2021-05-02 MED ORDER — SERTRALINE HCL 50 MG PO TABS
50.0000 mg | ORAL_TABLET | Freq: Every day | ORAL | Status: DC
  Filled 2021-05-02: qty 1

## 2021-05-02 MED ORDER — FLUTICASONE PROPIONATE 50 MCG/ACT NA SUSP: 1.0000 | Freq: Every day | NASAL | Status: DC

## 2021-05-02 NOTE — ED Provider Notes (Signed)
[facility] DEPT Provider Note   CSN: [id number] Arrival date & time: 05/02/21  1749     History Chief Complaint  Patient presents with   Suicidal    [name] is a 51 y.o. female.  Patient states she has a "manic depressive" and is here with feeling suicidal for the past 3 days.  States she has a plan to cut her wrists but has not acted on it.  She has not had her psychiatric medications for several months.  She continues to abuse crack on a daily basis and last smoked 2 days ago.  Denies any regular alcohol use.  Denies any injection drug use.  Denies wanting to hurt anyone else or hearing any voices.  She is calm and cooperative. States she has not had her medications for several months for unclear reasons. Denies any head, neck, back, chest pain, abdominal pain, shortness of breath, nausea or vomiting  The history is provided by the patient and a friend.      Past Medical History:  Diagnosis Date   Acid reflux    Anxiety    Asthma    Drug abuse ([place])    ETOH abuse    Hypertension    Manic-depressive disorder ([place])    PTSD (post-traumatic stress disorder)     Patient Active Problem List   Diagnosis Date Noted   Bipolar 1 disorder ([place]) 10/20/2013   Suicidal ideations 10/20/2013   Polysubstance abuse ([place]) 10/20/2013    Past Surgical History:  Procedure Laterality Date   TUBAL LIGATION       OB History   No obstetric history on file.     Family History  Problem Relation Age of Onset   Diabetes Mother    Hypertension Mother     Social History   Tobacco Use   Smoking status: Every Day    Packs/day: 0.50    Types: Cigarettes   Smokeless tobacco: Never   Tobacco comments:    in rehab  Vaping Use   Vaping Use: Never used  Substance Use Topics   Alcohol use: Yes    Comment: in rehab   Drug use: No    Comment: in rehab    Home Medications Prior to Admission medications   Medication Sig Start Date End Date  Taking? Authorizing Provider  albuterol (VENTOLIN HFA) 108 (90 Base) MCG/ACT inhaler Inhale 1-2 puffs into the lungs every 6 (six) hours as needed for wheezing or shortness of breath. 04/11/21   Lamptey, Myrene Galas, MD  benzonatate (TESSALON) 100 MG capsule Take 1 capsule (100 mg total) by mouth every 8 (eight) hours. 04/11/21   Lamptey, Myrene Galas, MD  Cetirizine HCl 10 MG CAPS Take 1 capsule (10 mg total) by mouth daily. 04/06/20   Wieters, Hallie C, PA-C  erythromycin ophthalmic ointment Place a 1/2 inch ribbon of ointment into the lower eyelid. 06/19/18   Khatri, Hina, PA-C  fluticasone (FLONASE) 50 MCG/ACT nasal spray Place 1 spray into both nostrils daily. 05/16/19   Recardo Evangelist, PA-C  guaiFENesin (MUCINEX) 600 MG 12 hr tablet Take 1 tablet (600 mg total) by mouth 2 (two) times daily. 04/11/21   Lamptey, Myrene Galas, MD  Lurasidone HCl 20 MG TABS Take 20 mg by mouth every morning.  10/23/13   [provider]  montelukast (SINGULAIR) 10 MG tablet Take 1 tablet (10 mg total) by mouth at bedtime. 04/11/21   Chase Picket, MD  sertraline (ZOLOFT) 50 MG tablet  Take by mouth. 05/09/19   [provider]  clonazePAM (KLONOPIN) 0.5 MG tablet Take 0.5 mg by mouth 3 (three) times daily as needed for anxiety.  04/06/20  [provider]  pantoprazole (PROTONIX) 40 MG tablet Take 40 mg by mouth daily.  10/23/13 04/06/20  [provider]    Allergies    Benadryl [diphenhydramine]  Review of Systems   Review of Systems  Constitutional:  Negative for activity change, appetite change and fever.  HENT:  Negative for congestion and nosebleeds.   Respiratory:  Negative for cough, chest tightness and shortness of breath.   Gastrointestinal:  Negative for abdominal pain, nausea and vomiting.  Genitourinary:  Negative for dysuria and hematuria.  Musculoskeletal:  Negative for arthralgias and myalgias.  Skin:  Negative for rash.  Neurological:  Negative for dizziness,  weakness and headaches.  Psychiatric/Behavioral:  Positive for decreased concentration, dysphoric mood, self-injury, sleep disturbance and suicidal ideas. The patient is nervous/anxious.    all other systems are negative except as noted in the HPI and PMH.   Physical Exam Updated Vital Signs BP (!) 183/104 (BP Location: Left Arm)   Pulse 75   Temp 98.3 F (36.8 C) (Oral)   Resp 16   Ht 5\' 2"  (1.575 m)   Wt 52.2 kg   LMP 04/03/2021 (Approximate)   SpO2 100%   BMI 21.03 kg/m   Physical Exam Vitals and nursing note reviewed.  Constitutional:      General: She is not in acute distress.    Appearance: She is well-developed.  HENT:     Head: Normocephalic and atraumatic.     Mouth/Throat:     Pharynx: No oropharyngeal exudate.  Eyes:     Conjunctiva/sclera: Conjunctivae normal.     Pupils: Pupils are equal, round, and reactive to light.  Neck:     Comments: No meningismus. Cardiovascular:     Rate and Rhythm: Normal rate and regular rhythm.     Heart sounds: Normal heart sounds. No murmur heard. Pulmonary:     Effort: Pulmonary effort is normal. No respiratory distress.     Breath sounds: Normal breath sounds.  Abdominal:     Palpations: Abdomen is soft.     Tenderness: There is no abdominal tenderness. There is no guarding or rebound.  Musculoskeletal:        General: No tenderness. Normal range of motion.     Cervical back: Normal range of motion and neck supple.  Skin:    General: Skin is warm.  Neurological:     Mental Status: She is alert and oriented to person, place, and time.     Cranial Nerves: No cranial nerve deficit.     Motor: No abnormal muscle tone.     Coordination: Coordination normal.     Comments:  5/5 strength throughout. CN 2-12 intact.Equal grip strength.   Psychiatric:        Behavior: Behavior normal.    ED Results / Procedures / Treatments   Labs (all labs ordered are listed, but only abnormal results are displayed) Labs Reviewed   COMPREHENSIVE METABOLIC PANEL - Abnormal; Notable for the following components:      Result Value   Glucose, Bld 109 (*)    Albumin 3.3 (*)    Total Bilirubin 0.2 (*)    All other components within normal limits  ACETAMINOPHEN LEVEL - Abnormal; Notable for the following components:   Acetaminophen (Tylenol), Serum <10 (*)    All other components within normal  limits  SALICYLATE LEVEL - Abnormal; Notable for the following components:   Salicylate Lvl <6.8 (*)    All other components within normal limits  RESP PANEL BY RT-PCR (FLU A&B, COVID) ARPGX2  CBC WITH DIFFERENTIAL/PLATELET  ETHANOL  RAPID URINE DRUG SCREEN, HOSP PERFORMED  I-STAT BETA HCG BLOOD, ED ([facility], [facility], [facility] ONLY)    EKG None  Radiology No results found.  Procedures Procedures   Medications Ordered in ED Medications  albuterol (VENTOLIN HFA) 108 (90 Base) MCG/ACT inhaler 1-2 puff (has no administration in time range)  fluticasone (FLONASE) 50 MCG/ACT nasal spray 1 spray (has no administration in time range)  lurasidone (LATUDA) tablet 20 mg (has no administration in time range)  sertraline (ZOLOFT) tablet 50 mg (has no administration in time range)  montelukast (SINGULAIR) tablet 10 mg (has no administration in time range)  loratadine (CLARITIN) tablet 10 mg (has no administration in time range)    ED Course  I have reviewed the triage vital signs and the nursing notes.  Pertinent labs & imaging results that were available during my care of the patient were reviewed by me and considered in my medical decision making (see chart for details).    MDM Rules/Calculators/A&P                          Suicidal thoughts with plan to cut wrists.  She was calm and cooperative and stable psychiatrically.  Obtain screening labs and obtain TTS consultation  Screening labs are unremarkable.  Patient with stable vital signs though hypertensive likely from cocaine abuse.  No chest pain, headache, visual changes.  She is  medically clear for TTS evaluation.  Holding orders are placed Final Clinical Impression(s) / ED Diagnoses Final diagnoses:  None    Rx / DC Orders ED Discharge Orders     None        Brant Peets, Annie Main, MD 05/03/21 0147

## 2021-05-02 NOTE — ED Triage Notes (Signed)
Pt reports being suicidal x 3 days. She states that she does crack with the last usage being 2 days ago. Pt states that she just feels sad and depressed and this causes her to want to cut her wrists. Pt states that she tried it years ago.

## 2021-05-03 ENCOUNTER — Inpatient Hospital Stay (HOSPITAL_COMMUNITY)
Admission: AD | Admit: 2021-05-03 | Discharge: 2021-05-09 | DRG: 885 | Disposition: A | Discharge status: Home / Self Care | Payer: Medicare Other | Source: Intra-hospital | Attending: Emergency Medicine | Admitting: Emergency Medicine

## 2021-05-03 ENCOUNTER — Encounter (HOSPITAL_COMMUNITY): Payer: Self-pay | Admitting: Family

## 2021-05-03 DIAGNOSIS — Z9151 Personal history of suicidal behavior: Secondary | ICD-10-CM

## 2021-05-03 DIAGNOSIS — F17213 Nicotine dependence, cigarettes, with withdrawal: Secondary | ICD-10-CM | POA: Diagnosis not present

## 2021-05-03 DIAGNOSIS — J45909 Unspecified asthma, uncomplicated: Secondary | ICD-10-CM | POA: Diagnosis not present

## 2021-05-03 DIAGNOSIS — R45851 Suicidal ideations: Secondary | ICD-10-CM | POA: Diagnosis not present

## 2021-05-03 DIAGNOSIS — F332 Major depressive disorder, recurrent severe without psychotic features: Principal | ICD-10-CM | POA: Diagnosis present

## 2021-05-03 DIAGNOSIS — F411 Generalized anxiety disorder: Secondary | ICD-10-CM

## 2021-05-03 DIAGNOSIS — I10 Essential (primary) hypertension: Secondary | ICD-10-CM | POA: Diagnosis not present

## 2021-05-03 DIAGNOSIS — Z20822 Contact with and (suspected) exposure to covid-19: Secondary | ICD-10-CM | POA: Diagnosis not present

## 2021-05-03 DIAGNOSIS — Z833 Family history of diabetes mellitus: Secondary | ICD-10-CM

## 2021-05-03 DIAGNOSIS — Z8249 Family history of ischemic heart disease and other diseases of the circulatory system: Secondary | ICD-10-CM | POA: Diagnosis not present

## 2021-05-03 DIAGNOSIS — G4700 Insomnia, unspecified: Secondary | ICD-10-CM | POA: Diagnosis not present

## 2021-05-03 DIAGNOSIS — F1910 Other psychoactive substance abuse, uncomplicated: Secondary | ICD-10-CM | POA: Diagnosis present

## 2021-05-03 DIAGNOSIS — F1410 Cocaine abuse, uncomplicated: Secondary | ICD-10-CM | POA: Diagnosis present

## 2021-05-03 LAB — COMPREHENSIVE METABOLIC PANEL WITH GFR
ALT: 10 U/L (ref 0–44)
AST: 15 U/L (ref 15–41)
Albumin: 3.3 g/dL — ABNORMAL LOW (ref 3.5–5.0)
Alkaline Phosphatase: 82 U/L (ref 38–126)
Anion gap: 9 (ref 5–15)
BUN: 15 mg/dL (ref 6–20)
CO2: 24 mmol/L (ref 22–32)
Calcium: 9 mg/dL (ref 8.9–10.3)
Chloride: 110 mmol/L (ref 98–111)
Creatinine, Ser: 0.59 mg/dL (ref 0.44–1.00)
GFR, Estimated: >60 mL/min
Glucose, Bld: 109 mg/dL — ABNORMAL HIGH (ref 70–99)
Potassium: 3.7 mmol/L (ref 3.5–5.1)
Sodium: 143 mmol/L (ref 135–145)
Total Bilirubin: 0.2 mg/dL — ABNORMAL LOW (ref 0.3–1.2)
Total Protein: 7.2 g/dL (ref 6.5–8.1)

## 2021-05-03 LAB — CBC WITH DIFFERENTIAL/PLATELET
Abs Immature Granulocytes: 0.01 10*3/uL (ref 0.00–0.07)
Basophils Absolute: 0.1 10*3/uL (ref 0.0–0.1)
Basophils Relative: 1 %
Eosinophils Absolute: 0.2 10*3/uL (ref 0.0–0.5)
Eosinophils Relative: 3 %
HCT: 42.8 % (ref 36.0–46.0)
Hemoglobin: 14.2 g/dL (ref 12.0–15.0)
Immature Granulocytes: 0 %
Lymphocytes Relative: 35 %
Lymphs Abs: 2.7 10*3/uL (ref 0.7–4.0)
MCH: 29.9 pg (ref 26.0–34.0)
MCHC: 33.2 g/dL (ref 30.0–36.0)
MCV: 90.1 fL (ref 80.0–100.0)
Monocytes Absolute: 0.5 10*3/uL (ref 0.1–1.0)
Monocytes Relative: 7 %
Neutro Abs: 4.2 10*3/uL (ref 1.7–7.7)
Neutrophils Relative %: 54 %
Platelets: 299 10*3/uL (ref 150–400)
RBC: 4.75 MIL/uL (ref 3.87–5.11)
RDW: 13.5 % (ref 11.5–15.5)
WBC: 7.8 10*3/uL (ref 4.0–10.5)
nRBC: 0 % (ref 0.0–0.2)

## 2021-05-03 LAB — ACETAMINOPHEN LEVEL: Acetaminophen (Tylenol), Serum: <10 ug/mL — ABNORMAL LOW (ref 10–30)

## 2021-05-03 LAB — RAPID URINE DRUG SCREEN, HOSP PERFORMED
Amphetamines: NOT DETECTED
Barbiturates: NOT DETECTED
Benzodiazepines: NOT DETECTED
Cocaine: POSITIVE — AB
Opiates: NOT DETECTED
Tetrahydrocannabinol: NOT DETECTED

## 2021-05-03 LAB — I-STAT BETA HCG BLOOD, ED (MC, WL, AP ONLY): I-stat hCG, quantitative: <5 m[IU]/mL

## 2021-05-03 LAB — RESP PANEL BY RT-PCR (FLU A&B, COVID) ARPGX2
Influenza A by PCR: NEGATIVE
Influenza B by PCR: NEGATIVE
SARS Coronavirus 2 by RT PCR: NEGATIVE

## 2021-05-03 LAB — SALICYLATE LEVEL: Salicylate Lvl: <7 mg/dL — ABNORMAL LOW (ref 7.0–30.0)

## 2021-05-03 LAB — ETHANOL: Alcohol, Ethyl (B): <10 mg/dL (ref <10)

## 2021-05-03 MED ORDER — METOPROLOL SUCCINATE ER 25 MG PO TB24
12.5000 mg | ORAL_TABLET | Freq: Every day | ORAL | Status: DC
  Administered 2021-05-03: 12.5 mg via ORAL
  Filled 2021-05-03: qty 0.5

## 2021-05-03 MED ORDER — TRAZODONE HCL 100 MG PO TABS
100.0000 mg | ORAL_TABLET | Freq: Every evening | ORAL | Status: DC | PRN
  Administered 2021-05-03 – 2021-05-08 (×5): 100 mg via ORAL
  Filled 2021-05-03: qty 7
  Filled 2021-05-03 (×6): qty 1

## 2021-05-03 MED ORDER — MONTELUKAST SODIUM 10 MG PO TABS
10.0000 mg | ORAL_TABLET | Freq: Every day | ORAL | Status: DC
  Administered 2021-05-05 – 2021-05-08 (×4): 10 mg via ORAL
  Filled 2021-05-03 (×10): qty 1

## 2021-05-03 MED ORDER — ALBUTEROL SULFATE HFA 108 (90 BASE) MCG/ACT IN AERS
1.0000 | INHALATION_SPRAY | Freq: Four times a day (QID) | RESPIRATORY_TRACT | Status: DC | PRN
  Administered 2021-05-03: 2 via RESPIRATORY_TRACT
  Administered 2021-05-04: 1 via RESPIRATORY_TRACT
  Administered 2021-05-04: 2 via RESPIRATORY_TRACT
  Administered 2021-05-04 – 2021-05-06 (×2): 1 via RESPIRATORY_TRACT
  Administered 2021-05-07 – 2021-05-09 (×2): 2 via RESPIRATORY_TRACT
  Filled 2021-05-03: qty 6.7

## 2021-05-03 MED ORDER — NICOTINE 21 MG/24HR TD PT24
21.0000 mg | MEDICATED_PATCH | Freq: Every day | TRANSDERMAL | Status: DC
  Filled 2021-05-03 (×9): qty 1

## 2021-05-03 MED ORDER — METOPROLOL SUCCINATE 12.5 MG HALF TABLET
12.5000 mg | ORAL_TABLET | Freq: Every day | ORAL | Status: DC
  Administered 2021-05-04 – 2021-05-09 (×6): 12.5 mg via ORAL
  Filled 2021-05-03: qty 1
  Filled 2021-05-03: qty 4
  Filled 2021-05-03 (×6): qty 1

## 2021-05-03 MED ORDER — ACETAMINOPHEN 325 MG PO TABS
650.0000 mg | ORAL_TABLET | Freq: Four times a day (QID) | ORAL | Status: DC | PRN
  Administered 2021-05-04 – 2021-05-08 (×3): 650 mg via ORAL
  Filled 2021-05-03 (×3): qty 2

## 2021-05-03 MED ORDER — ALUM & MAG HYDROXIDE-SIMETH 200-200-20 MG/5ML PO SUSP: 30.0000 mL | ORAL | Status: DC | PRN

## 2021-05-03 MED ORDER — MAGNESIUM HYDROXIDE 400 MG/5ML PO SUSP: 30.0000 mL | Freq: Every day | ORAL | Status: DC | PRN

## 2021-05-03 NOTE — ED Notes (Signed)
Report called to [facility] and transportation arranged.

## 2021-05-03 NOTE — Progress Notes (Addendum)
Pt admitted voluntarily to [facility] inpatient adult unit.  Pt reported she was having thoughts of suicide because of a recent relapse on crack cocaine.  Pt stated she had not used for months.  She stated a stressor was "people wanting more from me than I was wanting to give".  Pt is unemployed but receives disability d/t mental health.  Pt denied SI, HI and AVH.  Denied current legal issues.  Fifteen minute checks initiated for patient safety.  Pt safe on unit.

## 2021-05-03 NOTE — BH Assessment (Signed)
TTS spoke to [place] RN, to put pt in a private room to complete TTS assessment.  Clinician to call the cart in ten minutes.

## 2021-05-03 NOTE — ED Provider Notes (Signed)
Emergency Medicine Observation Re-evaluation Note  [name] is a 50 y.o. female, seen on rounds today.  Pt initially presented to the ED for complaints of Suicidal Currently, the patient is resting comfortably.  Physical Exam  BP (!) 147/118   Pulse 91   Temp 98.3 F (36.8 C) (Oral)   Resp 16   Ht 5\' 2"  (1.575 m)   Wt 52.2 kg   LMP 04/03/2021 (Approximate)   SpO2 99%   BMI 21.03 kg/m  Physical Exam Vitals and nursing note reviewed.  Constitutional:      General: She is not in acute distress.    Appearance: She is well-developed.  HENT:     Head: Normocephalic and atraumatic.  Eyes:     Conjunctiva/sclera: Conjunctivae normal.  Cardiovascular:     Rate and Rhythm: Normal rate and regular rhythm.     Heart sounds: No murmur heard. Pulmonary:     Effort: Pulmonary effort is normal.  Musculoskeletal:     Cervical back: Neck supple.  Skin:    General: Skin is warm and dry.  Neurological:     Mental Status: She is alert.     ED Course / MDM  EKG:   I have reviewed the labs performed to date as well as medications administered while in observation.  Recent changes in the last 24 hours include psychiatric evaluation recommending inpatient psychiatric admission.  Patient currently waiting for bed.  Plan  Current plan is for psychiatric admission.  [name] is not under involuntary commitment.     Teressa Lower, MD 05/03/21 [phone]

## 2021-05-03 NOTE — ED Notes (Signed)
RN attempted to call report to receiving RN.

## 2021-05-03 NOTE — ED Notes (Signed)
Attempted to call report to the receiving RN. RN will call back when available.

## 2021-05-03 NOTE — Progress Notes (Signed)
Pt accepted to [facility]   Patient meets inpatient criteria per Sheran Fava, FNP   The attending provider will be Viann Fish, MD   Call report to  [phone]    Harlene Ramus, RN @ [facility] ED notified.     Pt scheduled  to arrive at [facility] today between 1630-1700, if and when UDS is received and BP is improved. ED staff to continuing corresponding with [facility].   Mariea Clonts, MSW, LCSW-A  1:24 PM 05/03/2021

## 2021-05-03 NOTE — [facility] Group Notes (Signed)
[facility] Group Notes:  (Nursing/MHT/Case Management/Adjunct)  Date:  05/03/2021  Time:  10:12 PM  Type of Therapy:  Psychoeducational Skills  Participation Level:  None  Participation Quality:  Resistant  Affect:  Resistant  Cognitive:  Lacking  Insight:  Lacking  Engagement in Group:  Developing/Improving  Modes of Intervention:  Education  Summary of Progress/Problems: The patient did not attend group this evening.   Archie Balboa S 05/03/2021, 10:12 PM
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
## 2021-05-04 ENCOUNTER — Encounter (HOSPITAL_COMMUNITY): Payer: Self-pay

## 2021-05-04 DIAGNOSIS — F332 Major depressive disorder, recurrent severe without psychotic features: Principal | ICD-10-CM

## 2021-05-04 LAB — URINALYSIS, ROUTINE W REFLEX MICROSCOPIC
Bilirubin Urine: NEGATIVE
Glucose, UA: NEGATIVE mg/dL
Hgb urine dipstick: NEGATIVE
Ketones, ur: NEGATIVE mg/dL
Leukocytes,Ua: NEGATIVE
Nitrite: NEGATIVE
Protein, ur: NEGATIVE mg/dL
Specific Gravity, Urine: 1.024 (ref 1.005–1.030)
pH: 6 (ref 5.0–8.0)

## 2021-05-04 LAB — HEMOGLOBIN A1C
Hgb A1c MFr Bld: 6.2 % — ABNORMAL HIGH (ref 4.8–5.6)
Mean Plasma Glucose: 131.24 mg/dL

## 2021-05-04 LAB — LIPID PANEL
Cholesterol: 189 mg/dL (ref 0–200)
HDL: 67 mg/dL (ref >40)
LDL Cholesterol: 97 mg/dL (ref 0–99)
Total CHOL/HDL Ratio: 2.8 RATIO
Triglycerides: 123 mg/dL (ref <150)
VLDL: 25 mg/dL (ref 0–40)

## 2021-05-04 LAB — TSH: TSH: 1.745 u[IU]/mL (ref 0.350–4.500)

## 2021-05-04 MED ORDER — ARIPIPRAZOLE 5 MG PO TABS
5.0000 mg | ORAL_TABLET | Freq: Every day | ORAL | Status: DC
  Administered 2021-05-04 – 2021-05-09 (×6): 5 mg via ORAL
  Filled 2021-05-04: qty 7
  Filled 2021-05-04 (×9): qty 1

## 2021-05-04 MED ORDER — SERTRALINE HCL 50 MG PO TABS
50.0000 mg | ORAL_TABLET | Freq: Every day | ORAL | Status: DC
  Administered 2021-05-04 – 2021-05-09 (×6): 50 mg via ORAL
  Filled 2021-05-04 (×2): qty 1
  Filled 2021-05-04: qty 7
  Filled 2021-05-04 (×7): qty 1

## 2021-05-04 MED ORDER — HYDROXYZINE HCL 25 MG PO TABS
25.0000 mg | ORAL_TABLET | Freq: Three times a day (TID) | ORAL | Status: DC | PRN
  Administered 2021-05-04 – 2021-05-08 (×6): 25 mg via ORAL
  Filled 2021-05-04 (×6): qty 1
  Filled 2021-05-04: qty 10

## 2021-05-04 NOTE — Progress Notes (Signed)
Adult Psychoeducational Group Note  Date:  05/04/2021 Time:  10:23 AM  Group Topic/Focus:  Goals Group:   The focus of this group is to help patients establish daily goals to achieve during treatment and discuss how the patient can incorporate goal setting into their daily lives to aide in recovery.  Participation Level:  Active  Participation Quality:  Appropriate  Affect:  Appropriate  Cognitive:  Appropriate  Insight: Appropriate  Engagement in Group:  Engaged  Modes of Intervention:  Discussion  Additional Comments:    Elise Benne 05/04/2021, 10:23 AM

## 2021-05-04 NOTE — Progress Notes (Signed)
Recreation Therapy Notes  Date: 11.9.22 Time: 0930-1000 Location: [address] Dayroom  Group Topic: Stress Management   Goal Area(s) Addresses:  Patient will actively participate in stress management techniques presented during session.  Patient will successfully identify benefit of practicing stress management post d/c.   Intervention: Relaxation exercise with ambient sound and script   Activity: Guided Imagery. LRT provided education, instruction, and demonstration on practice of visualization via guided imagery. Patient was asked to participate in the technique introduced during session. LRT debriefed including topics of mindfulness, stress management and specific scenarios each patient could use these techniques. Patients were given suggestions of ways to access scripts post d/c and encouraged to explore Youtube and other apps available on smartphones, tablets, and computers.  Education:  Stress Management, Discharge Planning.   Clinical Observations/Feedback: Group did not occur due to orientation group going over 20 minutes.  Packets were given with information on ways to manage stress, techniques that can be used, proper breathing techniques and how to properly do the techniques given such as progressive muscle relaxation, diaphragmatic breathing and mindfulness meditation.     Victorino Sparrow, LRT, CTRS        Ria Comment, Nylan Nakatani A 05/04/2021 12:00 PM

## 2021-05-04 NOTE — Group Note (Signed)
LCSW Group Therapy Note   Group Date: 05/04/2021 Start Time: 1300 End Time: 1400   Type of Therapy and Topic:  Group Therapy:   Participation Level:  Did Not Attend  Description of Group:Group encouraged increased engagement and participation through discussion focused on Safety Planning. Patients worked both individually and collaboratively to create and discuss the different elements of a safety plan, including identifying warning signs, coping skills, professional supports, people you can ask for help, how to make the environment safe, and reasons for life worth living. Remainder of group was spent filling out individual safety plans to be placed in patient charts.   Therapeutic Goal(s): Identify warning signs and triggers Identify positive coping strategies Identify professional and personal supports when experiencing a mental health crisis Identify ways in which you can make the environment safe Identify reasons for life worth living Identify the steps to completing a safety plan and provide education on completing a safety plan at discharge   Summary of Patient Progress:  Did Not Attend   Mliss Fritz, Latanya Presser 05/04/2021  1:34 PM

## 2021-05-04 NOTE — Tx Team (Signed)
Initial Treatment Plan 05/04/2021 3:55 AM [name] PTC:[id number]    PATIENT STRESSORS: Marital or family conflict   Medication change or noncompliance     PATIENT STRENGTHS: General fund of knowledge  Motivation for treatment/growth    PATIENT IDENTIFIED PROBLEMS: Risk for SI  Medication non-compliance  SA-cocaine  "Get medications right"               DISCHARGE CRITERIA:  Improved stabilization in mood, thinking, and/or behavior Verbal commitment to aftercare and medication compliance  PRELIMINARY DISCHARGE PLAN: Attend aftercare/continuing care group Attend PHP/IOP Outpatient therapy  PATIENT/FAMILY INVOLVEMENT: This treatment plan has been presented to and reviewed with the patient, [name].  The patient and family have been given the opportunity to ask questions and make suggestions.  [name], RN 05/04/2021, 3:55 AM

## 2021-05-04 NOTE — [facility] Suicide Risk Assessment (Signed)
[facility] Admission Suicide Risk Assessment   Nursing information obtained from:  Patient Demographic factors:  Unemployed, Low socioeconomic status Current Mental Status:  Suicidal ideation indicated by patient Loss Factors:  NA Historical Factors:  Prior suicide attempts Risk Reduction Factors:  Living with another person, especially a relative  Total Time spent with patient: 30 minutes Principal Problem: MDD (major depressive disorder), recurrent episode, severe ([place]) Diagnosis:  Principal Problem:   MDD (major depressive disorder), recurrent episode, severe ([place])  Subjective Data:   Patient is a 50 year old female with a psychiatric history of "depression and bipolar disorder" and a history of multiple psychiatric hospitalizations, was admitted to the psychiatric unit for evaluation and treatment of depression and suicidal thoughts with plan to cut wrist.  Patient case was discussed in interdisciplinary team meeting this morning.  Review today, the patient reports she is then depressed and suicidal for about 1 week.  She reports worsening mood and suicidal thoughts due to "being fed up with my drug use".  She reports having history of both depression and bipolar, but is unable to delineate symptoms of bipolar disorder, as symptoms she described also coincide with her substance use.  She reports stopping medication many months ago.  She is unsure of the psychiatric medication she was taking most recently.  She reports history of multiple psychiatric hospitalizations.  Reports history of suicide attempt by cutting.  Cannot recall other psychiatric medications that she had taken in the past.  Reports she also has a medical history of asthma and hypertension, but not using inhaler or taking antihypertensive medications for some time. Patient agreeable with the plan, as discussed in the plan section of this document.  Continued Clinical Symptoms:  Alcohol Use Disorder Identification Test Final  Score (AUDIT): 2 The "Alcohol Use Disorders Identification Test", Guidelines for Use in Primary Care, Second Edition.  [employer] [facility]). Score between 0-7:  no or low risk or alcohol related problems. Score between 8-15:  moderate risk of alcohol related problems. Score between 16-19:  high risk of alcohol related problems. Score 20 or above:  warrants further diagnostic evaluation for alcohol dependence and treatment.   CLINICAL FACTORS:   Severe Anxiety and/or Agitation Bipolar Disorder:   Depressive phase   Musculoskeletal: Strength & Muscle Tone: within normal limits Gait & Station: normal Patient leans: N/A  Psychiatric Specialty Exam:  Presentation  General Appearance: Disheveled  Eye Contact:Good  Speech:Normal Rate  Speech Volume:Decreased  Handedness:Right   Mood and Affect  Mood:Anxious; Depressed; Dysphoric; Hopeless  Affect:Constricted   Thought Process  Thought Processes:Linear  Descriptions of Associations:Intact  Orientation:Full (Time, Place and Person)  Thought Content:Logical  History of Schizophrenia/Schizoaffective disorder:No  Duration of Psychotic Symptoms:No data recorded Hallucinations:Hallucinations: None  Ideas of Reference:None  Suicidal Thoughts:Suicidal Thoughts: -- (Suicidal thoughts are less since admission)  Homicidal Thoughts:Homicidal Thoughts: No   Sensorium  Memory:Immediate Fair; Remote Fair; Recent Fair  Judgment:Poor  Insight:Poor   Executive Functions  Concentration:Poor  Attention Span:Fair  [place] of Knowledge:Poor  Language:Good   Psychomotor Activity  Psychomotor Activity:Psychomotor Activity: Restlessness   Assets  Assets:Communication Skills; Desire for Improvement; Physical Health; Resilience; Social Support   Sleep  Sleep:Sleep: Poor Number of Hours of Sleep: 6.75    Physical Exam: Physical Exam see H&P ROS see H&P Blood pressure (!) 143/88, pulse  76, temperature 98 F (36.7 C), temperature source Oral, resp. rate 18, height 5\' 2"  (1.575 m), weight 52 kg, SpO2 100 %. Body mass index is 20.97  kg/m.   COGNITIVE FEATURES THAT CONTRIBUTE TO RISK:  None    SUICIDE RISK:   Moderate:  Frequent suicidal ideation with limited intensity, and duration, some specificity in terms of plans, no associated intent, good self-control, limited dysphoria/symptomatology, some risk factors present, and identifiable protective factors, including available and accessible social support.  PLAN OF CARE:   ASSESSMENT:  Diagnoses / Active Problems: Bipolar disorder versus major depressive disorder.  Patient has been through his diagnosis in the medical records.  Patient also reports herself, having both these diagnoses.  We will continue to monitor current psychiatric symptoms, and treat accordingly. Generalized anxiety disorder Cocaine use disorder  PLAN: Safety and Monitoring:  --  Voluntary admission to inpatient psychiatric unit for safety, stabilization and treatment  -- Daily contact with patient to assess and evaluate symptoms and progress in treatment  -- Patient's case to be discussed in multi-disciplinary team meeting  -- Observation Level : q15 minute checks  -- Vital signs:  q12 hours  -- Precautions: suicide, elopement, and assault  2. Psychiatric Diagnoses and Treatment:    Start sertraline 50 mg once daily for mood and anxiety Start Abilify 5 mg once daily for bipolar disorder.    --  The risks/benefits/side-effects/alternatives to this medication were discussed in detail with the patient and time was given for questions. The patient consents to medication trial.    -- Metabolic profile and EKG monitoring obtained while on an atypical antipsychotic (BMI: Lipid Panel: HbgA1c: QTc:) 509 - will re-order EKG, after starting abilify.  We started Abilify, as this is a more QTc neutral SGA, for mood stabilizers.  In the patient's medical  record, she had been on Abilify in the past.   -- Encouraged patient to participate in unit milieu and in scheduled group therapies   -- Short Term Goals: Ability to identify changes in lifestyle to reduce recurrence of condition will improve, Ability to verbalize feelings will improve, Ability to disclose and discuss suicidal ideas, Ability to demonstrate self-control will improve, Ability to identify and develop effective coping behaviors will improve, Ability to maintain clinical measurements within normal limits will improve, Compliance with prescribed medications will improve, and Ability to identify triggers associated with substance abuse/mental health issues will improve  -- Long Term Goals: Improvement in symptoms so as ready for discharge    3. Medical Issues Being Addressed:   Tobacco Use Disorder  -- Nicotine patch 21mg /24 hours ordered  -- Smoking cessation encouraged  Hypertension.  We will monitor blood pressure, and start antihypertensive medication if needed.  4. Discharge Planning:   -- Social work and case management to assist with discharge planning and identification of hospital follow-up needs prior to discharge  -- Estimated LOS: 5-7 days  -- Discharge Concerns: Need to establish a safety plan; Medication compliance and effectiveness  -- Discharge Goals: Return home with outpatient referrals for mental health follow-up including medication management/psychotherapy   I certify that inpatient services furnished can reasonably be expected to improve the patient's condition.   Christoper Allegra, MD 05/04/2021, 6:54 PM

## 2021-05-04 NOTE — BH IP Treatment Plan (Signed)
Interdisciplinary Treatment and Diagnostic Plan Update  05/04/2021 Time of Session: 9:45am  OVETA IDRIS MRN: [id number]  Principal Diagnosis: <principal problem not specified>  Secondary Diagnoses: Active Problems:   MDD (major depressive disorder), recurrent episode, severe (H[facility])   Current Medications:  Current Facility-Administered Medications  Medication Dose Route Frequency Provider Last Rate Last Admin   acetaminophen (TYLENOL) tablet 650 mg  650 mg Oral Q6H PRN [name], FNP   650 mg at 05/04/21 0936   albuterol (VENTOLIN HFA) 108 (90 Base) MCG/ACT inhaler 1-2 puff  1-2 puff Inhalation Q6H PRN [name], FNP   1 puff at 05/04/21 0828   alum & mag hydroxide-simeth (MAALOX/MYLANTA) 200-200-20 MG/5ML suspension 30 mL  30 mL Oral Q4H PRN Starkes-Perry, Gayland Curry, FNP       magnesium hydroxide (MILK OF MAGNESIA) suspension 30 mL  30 mL Oral Daily PRN Starkes-Perry, Gayland Curry, FNP       metoprolol succinate (TOPROL-XL) 24 hr tablet 12.5 mg  12.5 mg Oral Daily [name], FNP   12.5 mg at 05/04/21 0826   montelukast (SINGULAIR) tablet 10 mg  10 mg Oral QHS Starkes-Perry, Gayland Curry, FNP       nicotine (NICODERM CQ - dosed in mg/24 hours) patch 21 mg  21 mg Transdermal Q0600 [name], FNP       traZODone (DESYREL) tablet 100 mg  100 mg Oral QHS PRN [name], FNP   100 mg at 05/03/21 2056   PTA Medications: Medications Prior to Admission  Medication Sig Dispense Refill Last Dose   albuterol (VENTOLIN HFA) 108 (90 Base) MCG/ACT inhaler Inhale 1-2 puffs into the lungs every 6 (six) hours as needed for wheezing or shortness of breath. 18 g 0    aspirin 325 MG EC tablet Take 1,300 mg by mouth every 4 (four) hours as needed for pain.      benzonatate (TESSALON) 100 MG capsule Take 1 capsule (100 mg total) by mouth every 8 (eight) hours. (Patient taking differently: Take 100 mg by mouth every 8 (eight) hours as needed for cough.) 21  capsule 0    guaiFENesin (MUCINEX) 600 MG 12 hr tablet Take 1 tablet (600 mg total) by mouth 2 (two) times daily. (Patient not taking: Reported on 05/03/2021)      montelukast (SINGULAIR) 10 MG tablet Take 1 tablet (10 mg total) by mouth at bedtime. (Patient not taking: No sig reported) 60 tablet 1     Patient Stressors: Marital or family conflict   Medication change or noncompliance    Patient Strengths: Psychologist, clinical for treatment/growth   Treatment Modalities: Medication Management, Group therapy, Case management,  1 to 1 session with clinician, Psychoeducation, Recreational therapy.   Physician Treatment Plan for Primary Diagnosis: <principal problem not specified> Long Term Goal(s): Improvement in symptoms so as ready for discharge   Short Term Goals: Ability to identify and develop effective coping behaviors will improve Ability to maintain clinical measurements within normal limits will improve Ability to identify triggers associated with substance abuse/mental health issues will improve Ability to identify changes in lifestyle to reduce recurrence of condition will improve Ability to verbalize feelings will improve Ability to disclose and discuss suicidal ideas Ability to demonstrate self-control will improve  Medication Management: Evaluate patient's response, side effects, and tolerance of medication regimen.  Therapeutic Interventions: 1 to 1 sessions, Unit Group sessions and Medication administration.  Evaluation of Outcomes: Not Met  Physician Treatment Plan  for Secondary Diagnosis: Active Problems:   MDD (major depressive disorder), recurrent episode, severe ([place])  Long Term Goal(s): Improvement in symptoms so as ready for discharge   Short Term Goals: Ability to identify and develop effective coping behaviors will improve Ability to maintain clinical measurements within normal limits will improve Ability to identify triggers associated with  substance abuse/mental health issues will improve Ability to identify changes in lifestyle to reduce recurrence of condition will improve Ability to verbalize feelings will improve Ability to disclose and discuss suicidal ideas Ability to demonstrate self-control will improve     Medication Management: Evaluate patient's response, side effects, and tolerance of medication regimen.  Therapeutic Interventions: 1 to 1 sessions, Unit Group sessions and Medication administration.  Evaluation of Outcomes: Not Met   RN Treatment Plan for Primary Diagnosis: <principal problem not specified> Long Term Goal(s): Knowledge of disease and therapeutic regimen to maintain health will improve  Short Term Goals: Ability to remain free from injury will improve, Ability to participate in decision making will improve, Ability to verbalize feelings will improve, Ability to disclose and discuss suicidal ideas, and Ability to identify and develop effective coping behaviors will improve  Medication Management: RN will administer medications as ordered by provider, will assess and evaluate patient's response and provide education to patient for prescribed medication. RN will report any adverse and/or side effects to prescribing provider.  Therapeutic Interventions: 1 on 1 counseling sessions, Psychoeducation, Medication administration, Evaluate responses to treatment, Monitor vital signs and CBGs as ordered, Perform/monitor CIWA, COWS, AIMS and Fall Risk screenings as ordered, Perform wound care treatments as ordered.  Evaluation of Outcomes: Not Met   LCSW Treatment Plan for Primary Diagnosis: <principal problem not specified> Long Term Goal(s): Safe transition to appropriate next level of care at discharge, Engage patient in therapeutic group addressing interpersonal concerns.  Short Term Goals: Engage patient in aftercare planning with referrals and resources, Increase social support, Increase emotional  regulation, Facilitate acceptance of mental health diagnosis and concerns, Identify triggers associated with mental health/substance abuse issues, and Increase skills for wellness and recovery  Therapeutic Interventions: Assess for all discharge needs, 1 to 1 time with Social worker, Explore available resources and support systems, Assess for adequacy in community support network, Educate family and significant other(s) on suicide prevention, Complete Psychosocial Assessment, Interpersonal group therapy.  Evaluation of Outcomes: Not Met   Progress in Treatment: Attending groups: No. Participating in groups: No. Taking medication as prescribed: Yes. Toleration medication: Yes. Family/Significant other contact made: Yes, individual(s) contacted:  Friend  Patient understands diagnosis: Yes. Discussing patient identified problems/goals with staff: Yes. Medical problems stabilized or resolved: Yes. Denies suicidal/homicidal ideation: Yes. Issues/concerns per patient self-inventory: No.   New problem(s) identified: No, Describe:  None   New Short Term/Long Term Goal(s): medication stabilization, elimination of SI thoughts, development of comprehensive mental wellness plan.   Patient Goals: "To get stable on my medications"   Discharge Plan or Barriers: Patient recently admitted. CSW will continue to follow and assess for appropriate referrals and possible discharge planning.   Reason for Continuation of Hospitalization: Depression Medication stabilization Suicidal ideation Withdrawal symptoms  Estimated Length of Stay: 3 to 5 days    Scribe for Treatment Team: Darleen Crocker, Latanya Presser 05/04/2021 2:26 PM
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
## 2021-05-04 NOTE — Plan of Care (Signed)
Nurse discussed anxiety, depression and coping skills with patient.  

## 2021-05-04 NOTE — Progress Notes (Signed)
D:  Patient's self inventory sheet, patient has fair sleep, sleep medication administered.  Good appetite, low energy level, good concentration.  Rated depression and anxiety 8, hopeless 7.  Denied withdrawals.  Denied SI.  Physical problems, headaches. A:  Medications administered per MD orders.  Emotional support and encouragement given patient. R:  Denied SI and HI, contracts for safety.  Denied A/V hallucinations.  Safety maintained with 15 minute checks.
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
## 2021-05-05 DIAGNOSIS — F332 Major depressive disorder, recurrent severe without psychotic features: Secondary | ICD-10-CM | POA: Diagnosis not present

## 2021-05-05 MED ORDER — WHITE PETROLATUM EX OINT
TOPICAL_OINTMENT | CUTANEOUS | Status: AC
  Filled 2021-05-05: qty 5

## 2021-05-05 NOTE — Plan of Care (Signed)
Patient has remained calm and cooperative. Denied thoughts of of self-harm. Denied hallucinations. Reported feeling improvement in mood. Has attended some groups and took a nap in the afternoon. Appetite is fair. Had no concerns throughout this shift.

## 2021-05-05 NOTE — [facility] Group Notes (Signed)
Pt did not attend group. 

## 2021-05-05 NOTE — [facility] Group Notes (Signed)
Relaxation and Psycho-ed Group were combined. Healthy coping skills were discussed as it attributes to promoting mental well being. Discussed with patients that mental health journey is like a pie, and that healthy coping skills add value to mental well being. Identified unhealthy coping skills and how they play negative impact, and identified positive healthy coping skills that are free, accessible and easily implemented. Closed group with deep breathing practice and education regarding stress reduction.  Pt attended and was appropriate in participation.

## 2021-05-05 NOTE — Progress Notes (Signed)
     05/05/21 0637  Vital Signs  Temp 98.1 F (36.7 C)  Temp Source Oral  Pulse Rate 73  Pulse Rate Source Monitor  BP (!) 167/95  BP Location Right Arm  BP Method Automatic  Patient Position (if appropriate) Sitting  Oxygen Therapy  SpO2 100 %      05/05/21 0638  Vital Signs  Pulse Rate 84  Pulse Rate Source Monitor  BP (!) 160/102  BP Location Right Arm  BP Method Automatic  Patient Position (if appropriate) Standing  Oxygen Therapy  SpO2 100 %    BP elevated.  Margorie John, Provider notified.  Per provider, okay to administer metoprolol succinate 12.5 mg due at 0800 at 0643.

## 2021-05-05 NOTE — Progress Notes (Signed)
[facility] MD Progress Note  05/05/2021 4:47 PM [name]  MRN:  [id number]  Subjective: [name] reports, My mood is a little better, I'm just feeling blaah. I think I'm sleeping too much all the time. But I think I'm benefiting from being in the hospital.". Daily notes: [name] is seen, chart reviewed. The chart findings dicussed with the treatment team. She presents alert, oriented & aware of situation. She is visible on the unit, attending group sessions. Patient reports feeling some better & yet feeling blaah. She reports she is sleeping a lot even when she was at home. A review of her sleep hours indicated 6.75 hours last night. Patient did not appear sleepy or groggy during this evaluation. She thinks she is benefiting from her hospitalization. She is taking & tolerating her treatment regimen. Denies any side effects. She denies any SIHI, AVH, delusional thoughts or paranoia. She does not appear to be responding to any internal stimuli. We will continue her current plan of care as already in progress.  Reason for admission: 50 year old AA female with hx of mental health issues & cocaine use disorders. Her previous admission in this bHH was 10 years ago". She is admitted to the [facility] this time with complain of worsening suicidal ideations of 3 days with plan to cut her wrist. She was brought to the hospital by her friend Thayer Jew) for evaluation & treatments. Her UDS was positive for Cocaine.   Principal Problem: MDD (major depressive disorder), recurrent episode, severe ([place])  Diagnosis: Principal Problem:   MDD (major depressive disorder), recurrent episode, severe ([place])  Total Time spent with patient:  35 minutes  Past Psychiatric History: Major depressive disorder, recurrent.  Past Medical History:  Past Medical History:  Diagnosis Date   Acid reflux    Anxiety    Asthma    Drug abuse ([place])    ETOH abuse    Hypertension    Manic-depressive disorder ([place])    PTSD (post-traumatic  stress disorder)     Past Surgical History:  Procedure Laterality Date   TUBAL LIGATION     Family History:  Family History  Problem Relation Age of Onset   Diabetes Mother    Hypertension Mother    Family Psychiatric  History: See H&P.  Social History:  Social History   Substance and Sexual Activity  Alcohol Use Yes   Comment: in rehab     Social History   Substance and Sexual Activity  Drug Use Yes   Types: Cocaine   Comment: in rehab    Social History   Socioeconomic History   Marital status: Divorced    Spouse name: Not on file   Number of children: Not on file   Years of education: Not on file   Highest education level: Not on file  Occupational History   Not on file  Tobacco Use   Smoking status: Every Day    Packs/day: 0.50    Years: 29.00    Pack years: 14.50    Types: Cigarettes   Smokeless tobacco: Never   Tobacco comments:    in rehab  Vaping Use   Vaping Use: Never used  Substance and Sexual Activity   Alcohol use: Yes    Comment: in rehab   Drug use: Yes    Types: Cocaine    Comment: in rehab   Sexual activity: Yes    Birth control/protection: None  Other Topics Concern   Not on file  Social History Narrative  Not on file   Social Determinants of Health   Financial Resource Strain: Not on file  Food Insecurity: Not on file  Transportation Needs: Not on file  Physical Activity: Not on file  Stress: Not on file  Social Connections: Not on file   Additional Social History:   Sleep: Good  Appetite:  Good  Current Medications: Current Facility-Administered Medications  Medication Dose Route Frequency Provider Last Rate Last Admin   acetaminophen (TYLENOL) tablet 650 mg  650 mg Oral Q6H PRN [name], FNP   650 mg at 05/04/21 0936   albuterol (VENTOLIN HFA) 108 (90 Base) MCG/ACT inhaler 1-2 puff  1-2 puff Inhalation Q6H PRN [name], FNP   1 puff at 05/04/21 1613   alum & mag hydroxide-simeth  (MAALOX/MYLANTA) 200-200-20 MG/5ML suspension 30 mL  30 mL Oral Q4H PRN Starkes-Perry, Gayland Curry, FNP       ARIPiprazole (ABILIFY) tablet 5 mg  5 mg Oral Daily Manvir Thorson I, NP   5 mg at 05/05/21 1517   hydrOXYzine (ATARAX/VISTARIL) tablet 25 mg  25 mg Oral TID PRN Lindell Spar I, NP   25 mg at 05/04/21 1609   magnesium hydroxide (MILK OF MAGNESIA) suspension 30 mL  30 mL Oral Daily PRN [name], FNP       metoprolol succinate (TOPROL-XL) 24 hr tablet 12.5 mg  12.5 mg Oral Daily [name], FNP   12.5 mg at 05/05/21 0643   montelukast (SINGULAIR) tablet 10 mg  10 mg Oral QHS Starkes-Perry, Gayland Curry, FNP       nicotine (NICODERM CQ - dosed in mg/24 hours) patch 21 mg  21 mg Transdermal Q0600 [name], FNP       sertraline (ZOLOFT) tablet 50 mg  50 mg Oral Daily Elisheba Mcdonnell, Herbert Pun I, NP   50 mg at 05/05/21 6160   traZODone (DESYREL) tablet 100 mg  100 mg Oral QHS PRN [name], FNP   100 mg at 05/03/21 2056    Lab Results:  Results for orders placed or performed during the hospital encounter of 05/03/21 (from the past 48 hour(s))  TSH     Status: None   Collection Time: 05/04/21  6:22 AM  Result Value Ref Range   TSH 1.745 0.350 - 4.500 uIU/mL    Comment: Performed by a 3rd Generation assay with a functional sensitivity of <=0.01 uIU/mL. Performed at [facility], [place] [address] [place], [place] [address]   Lipid panel     Status: None   Collection Time: 05/04/21  6:22 AM  Result Value Ref Range   Cholesterol 189 0 - 200 mg/dL   Triglycerides 123 <150 mg/dL   HDL 67 >40 mg/dL   Total CHOL/HDL Ratio 2.8 RATIO   VLDL 25 0 - 40 mg/dL   LDL Cholesterol 97 0 - 99 mg/dL    Comment:        Total Cholesterol/HDL:CHD Risk Coronary Heart Disease Risk Table                     Men   Women  1/2 Average Risk   3.4   3.3  Average Risk       5.0   4.4  2 X Average Risk   9.6   7.1  3 X Average Risk  23.4   11.0        Use the  calculated Patient Ratio above and the CHD Risk Table  to determine the patient's CHD Risk.        ATP III CLASSIFICATION (LDL):  <100     mg/dL   Optimal  100-129  mg/dL   Near or Above                    Optimal  130-159  mg/dL   Borderline  160-189  mg/dL   High  >190     mg/dL   Very High Performed at [place] [address] [place], [place] [address]   Hemoglobin A1c     Status: Abnormal   Collection Time: 05/04/21  6:22 AM  Result Value Ref Range   Hgb A1c MFr Bld 6.2 (H) 4.8 - 5.6 %    Comment: (NOTE) Pre diabetes:          5.7%-6.4%  Diabetes:              >6.4%  Glycemic control for   <7.0% adults with diabetes    Mean Plasma Glucose 131.24 mg/dL    Comment: Performed at [facility] [address] [place], [place] [address]  Urinalysis, Routine w reflex microscopic Urine, Clean Catch     Status: None   Collection Time: 05/04/21  5:18 PM  Result Value Ref Range   Color, Urine YELLOW YELLOW   APPearance CLEAR CLEAR   Specific Gravity, Urine 1.024 1.005 - 1.030   pH 6.0 5.0 - 8.0   Glucose, UA NEGATIVE NEGATIVE mg/dL   Hgb urine dipstick NEGATIVE NEGATIVE   Bilirubin Urine NEGATIVE NEGATIVE   Ketones, ur NEGATIVE NEGATIVE mg/dL   Protein, ur NEGATIVE NEGATIVE mg/dL   Nitrite NEGATIVE NEGATIVE   Leukocytes,Ua NEGATIVE NEGATIVE    Comment: Performed at [facility], [place] [address], [place], [place] [address]   Blood Alcohol level:  Lab Results  Component Value Date   [facility] <10 05/02/2021   ETH <11 88/50/2774   Metabolic Disorder Labs: Lab Results  Component Value Date   HGBA1C 6.2 (H) 05/04/2021   MPG 131.24 05/04/2021   MPG 129 09/02/2007   No results found for: PROLACTIN Lab Results  Component Value Date   CHOL 189 05/04/2021   TRIG 123 05/04/2021   HDL 67 05/04/2021   CHOLHDL 2.8 05/04/2021   VLDL 25 05/04/2021   LDLCALC 97 05/04/2021   LDLCALC  09/03/2007    77        Total Cholesterol/HDL:CHD  Risk Coronary Heart Disease Risk Table                     Men   Women  1/2 Average Risk   3.4   3.3   Physical Findings: AIMS:  , ,  ,  ,    CIWA:    COWS:     Musculoskeletal: Strength & Muscle Tone: within normal limits Gait & Station: normal Patient leans: N/A  Psychiatric Specialty Exam:  Presentation  General Appearance: Appropriate for Environment; Casual; Fairly Groomed  Eye Contact:Good  Speech:Clear and Coherent; Normal Rate  Speech Volume:Normal  Handedness:Right   Mood and Affect  Mood:-- ("improving")  Affect:Congruent; Appropriate   Thought Process  Thought Processes:Coherent  Descriptions of Associations:Intact  Orientation:Full (Time, Place and Person)  Thought Content:Logical  History of Schizophrenia/Schizoaffective disorder:No  Duration of Psychotic Symptoms:No data recorded Hallucinations:Hallucinations: None  Ideas of Reference:None  Suicidal Thoughts:Suicidal Thoughts: No  Homicidal Thoughts:Homicidal Thoughts: No   Sensorium  Memory:Immediate Good; Recent Good; Remote  [place]  Executive Functions  Concentration:Good  Attention Span:Good  [place]  Language:Good  Psychomotor Activity  Psychomotor Activity:Psychomotor Activity: Normal  Assets  Assets:Communication Skills; Desire for Improvement; Financial Resources/Insurance; Housing; Physical Health; Resilience; Social Support  Sleep  Sleep:Sleep: Good Number of Hours of Sleep: 6.75  Physical Exam: Physical Exam Vitals and nursing note reviewed.  HENT:     Mouth/Throat:     Pharynx: Oropharynx is clear.  Cardiovascular:     Rate and Rhythm: Normal rate.     Pulses: Normal pulses.  Pulmonary:     Effort: Pulmonary effort is normal.  Genitourinary:    Comments: Deferred Musculoskeletal:        General: Normal range of motion.     Cervical back: Normal range of motion.  Skin:    General: Skin  is warm and dry.  Neurological:     General: No focal deficit present.     Mental Status: She is alert and oriented to person, place, and time.   Review of Systems  Constitutional:  Negative for chills and fever.  HENT:  Negative for congestion and sore throat.   Respiratory:  Negative for cough, shortness of breath and wheezing.   Cardiovascular:  Negative for chest pain and palpitations.  Gastrointestinal:  Negative for abdominal pain, diarrhea, heartburn, nausea and vomiting.  Genitourinary:  Negative for dysuria.  Musculoskeletal:  Negative for joint pain and myalgias.  Skin: Negative.   Neurological:  Negative for dizziness, tingling, tremors, sensory change, speech change, focal weakness, seizures, loss of consciousness, weakness and headaches.  Endo/Heme/Allergies:        Allergies: Benadryl  Latex allergy.  Psychiatric/Behavioral:  Positive for depression ("Improving") and substance abuse (Hx. cocaine use disorder.). Negative for hallucinations, memory loss and suicidal ideas. The patient is not nervous/anxious and does not have insomnia.   Blood pressure 137/76, pulse 68, temperature 98.1 F (36.7 C), temperature source Oral, resp. rate 18, height 5\' 2"  (1.575 m), weight 52 kg, SpO2 100 %. Body mass index is 20.97 kg/m.  Treatment Plan Summary: Daily contact with patient to assess and evaluate symptoms and progress in treatment and Medication management.   Continue inpatient hospitalization. Will continue today 05/05/2021 plan as below except where it is noted.   Diagnoses.  Major depressive disorder, recurrent, r/o Bipolar disorder.  Cocaine use disorder.    2. Medication management to reduce current symptoms to base line and improve the patient's overall level of functioning: See [facility] for plan of care.   Major depressive disorder.  Continue Sertraline 50 mg po daily. Continue Abilify 5 mg po daily to augment Sertraline.    Anxiety.  Continue Vistaril 25 mg po tid  prn.    Insomnia. Continue Trazodone 100 mg po Q hs prn.   Other medical issues/complaints, continue.  Metoprolol 12.5 mg po daily for HTN.  Nicotine patch 21 mg po trans-dermally Q 24 hrs for nicotine withdrawal. Albuterol inhaler 1-2 puff Q 6 hrs prn for SOB. Singulair 10 mg po daily for Asthma. Acetaminophen 650 mg po Q 6 hrs prn for pain/fever. Mylanta 30 ml po Q 4 hrs prn for indigestion.  MOM 30 ml po Q daily prn for constipation.  Encourage group milieu attendance & participation. Discharge disposition plans is ongoing per Education officer, museum.  Lindell Spar, NP, pmhnp, fnp-np 05/05/2021, 4:47 PM

## 2021-05-05 NOTE — Plan of Care (Signed)
  Problem: Education: Goal: Emotional status will improve Outcome: Not Progressing Goal: Mental status will improve Outcome: Not Progressing   Problem: Activity: Goal: Interest or engagement in activities will improve Outcome: Not Progressing   
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
## 2021-05-05 NOTE — BHH Group Notes (Signed)
Adult Psychoeducational Group Note  Date:  05/05/2021 Time:  8:58 PM  Group Topic/Focus:  Wrap-Up Group:   The focus of this group is to help patients review their daily goal of treatment and discuss progress on daily workbooks.  Participation Level:  Active  Participation Quality:  Attentive  Affect:  Appropriate  Cognitive:  Alert  Insight: Appropriate  Engagement in Group:  Engaged  Modes of Intervention:  Discussion  Additional Comments:  Patient attended and participated in the wrap-up group.  [name] 05/05/2021, 8:58 PM

## 2021-05-05 NOTE — Progress Notes (Signed)
   On assessment, patient presents with moderate anxiety and depression.  Patient is lying in bed on her right side asleep.  During the assessment, patient denies SI/HI and verbally contracts for safety.  Patient also denies AVH.  Patient is advised of medications to be administered.  Patient remains sleep.  Patient is safe on unit with Q 15 minute safety checks.     05/04/21 2045  Psych Admission Type (Psych Patients Only)  Admission Status Voluntary  Psychosocial Assessment  Patient Complaints Anxiety;Depression  Eye Contact Fair  Facial Expression Anxious  Affect Anxious  Speech Soft  Interaction Assertive  Motor Activity Slow  Appearance/Hygiene Disheveled;In scrubs  Behavior Characteristics Cooperative  Mood Depressed;Anxious  Thought Process  Coherency WDL  Content WDL  Delusions WDL  Perception WDL  Hallucination None reported or observed  Judgment Poor  Confusion WDL  Danger to Self  Current suicidal ideation? Denies  Self-Injurious Behavior No self-injurious ideation or behavior indicators observed or expressed   Agreement Not to Harm Self Yes  Description of Agreement verbal contract for safety  Danger to Others  Danger to Others None reported or observed

## 2021-05-06 DIAGNOSIS — F332 Major depressive disorder, recurrent severe without psychotic features: Secondary | ICD-10-CM | POA: Diagnosis not present

## 2021-05-06 NOTE — BHH Group Notes (Signed)
Patient attended music therapy group and was active during discussion of using music as a coping skill.

## 2021-05-06 NOTE — BHH Group Notes (Signed)
PsychoEducational Group- Self Care Toolkit and Sleep Hygiene were discussed in group. The patients were asked to identify healthy and unhealthy behaviors as it relates to sleep hygiene. Patients were also asked to identify simple was to improve self care, identifying a gratitude list, establishing healthy boundaries and recognizing the impact of unhealthy boundaries as it impacts mental health. Pt left before group finished

## 2021-05-06 NOTE — Progress Notes (Signed)
   05/06/21 0000  Psych Admission Type (Psych Patients Only)  Admission Status Voluntary  Psychosocial Assessment  Patient Complaints Sadness;Depression  Eye Contact Fair  Facial Expression Anxious  Affect Anxious  Speech Soft  Interaction Cautious  Motor Activity Slow  Appearance/Hygiene Improved  Behavior Characteristics Cooperative  Mood Depressed;Anxious  Thought Process  Coherency WDL  Content WDL  Delusions WDL  Perception WDL  Hallucination None reported or observed  Judgment Poor  Confusion WDL  Danger to Self  Current suicidal ideation? Denies  Self-Injurious Behavior No self-injurious ideation or behavior indicators observed or expressed   Agreement Not to Harm Self Yes  Description of Agreement verbal  Danger to Others  Danger to Others None reported or observed

## 2021-05-06 NOTE — [facility] Suicide Risk Assessment (Signed)
[facility] INPATIENT:  Family/Significant Other Suicide Prevention Education  Suicide Prevention Education:  Education Completed; [name] [phone] (Friend) has been identified by the patient as the family member/significant other with whom the patient will be residing, and identified as the person(s) who will aid the patient in the event of a mental health crisis (suicidal ideations/suicide attempt).  With written consent from the patient, the family member/significant other has been provided the following suicide prevention education, prior to the and/or following the discharge of the patient.  The suicide prevention education provided includes the following: Suicide risk factors Suicide prevention and interventions National Suicide Hotline telephone number [name] assessment telephone number [name] Emergency Assistance [name] and/or Residential Mobile Crisis Unit telephone number  Request made of family/significant other to: Remove weapons (e.g., guns, rifles, knives), all items previously/currently identified as safety concern.   Remove drugs/medications (over-the-counter, prescriptions, illicit drugs), all items previously/currently identified as a safety concern.  The family member/significant other verbalizes understanding of the suicide prevention education information provided.  The family member/significant other agrees to remove the items of safety concern listed above.  CSW spoke with [name] who states that [name] is welcome to come back to the home and that she will be safe there.  He also states that there are no firearms or weapons in the home.  He also states that he would like [name] to attend at least some type of outpatient treatment for her substance use.  [name] states that he has no other questions or concerns.  CSW completed SPE with [name].    [name] 05/06/2021, 11:36 AM

## 2021-05-06 NOTE — Group Note (Signed)
Recreation Therapy Group Note   Group Topic:Stress Management  Group Date: 05/06/2021 Start Time: 0930 End Time: 0950 Facilitators: Victorino Sparrow, LRT,CTRS Location: [address] Dayroom  Goal Area(s) Addresses:  Patient will actively participate in stress management techniques presented during session.  Patient will successfully identify benefit of practicing stress management post d/c.   Group Description: Guided Imagery. LRT provided education, instruction, and demonstration on practice of visualization via guided imagery. Patient was asked to participate in the technique introduced during session. LRT debriefed including topics of mindfulness, stress management and specific scenarios each patient could use these techniques. Patients were given suggestions of ways to access scripts post d/c and encouraged to explore Youtube and other apps available on smartphones, tablets, and computers.    Affect/Mood: Appropriate   Participation Level: Engaged   Participation Quality: Independent   Behavior: Appropriate   Speech/Thought Process: Focused   Insight: Good   Judgement: Good   Modes of Intervention: Ambient Sounds, Script   Patient Response to Interventions:  Engaged   Education Outcome:  Acknowledges education and In group clarification offered    Clinical Observations/Individualized Feedback: Pt attended and participated in group.    Plan: Continue to engage patient in RT group sessions 2-3x/week.   Victorino Sparrow, LRT,CTRS  05/06/2021 11:21 AM

## 2021-05-06 NOTE — [facility] Group Notes (Signed)
Pt did not attend grp

## 2021-05-06 NOTE — Progress Notes (Signed)
   05/06/21 1138  Vital Signs  Temp 98.3 F (36.8 C)  Temp Source Oral  Pulse Rate 65  BP (!) 144/81  BP Method Automatic   D: Pt. Denies SI/HI/AVH. Pt. Denies both anxiety and depression. Pt. Was isolative in room at times but was out infrequently.  A:  Patient took scheduled medicine. Pt took 12.5 mg of Atenolol, 40 mg of Lipitor, I container of boost, 10 mg of lisinopril, 10 mg of Tapazole. 1 mg of Risperdal.other wrong pt. was scanned. Pt was told of medicine while she was given the medicine and pt.nodded in agreement for the medicine. Dr. Was informed of the medication given. Pt. Was given an EKG.   Pt. Has had no complaints.  R: Support and encouragement provided Routine safety checks conducted every 15 minutes. Patient  Informed to notify staff with any concerns.  Safety maintained.

## 2021-05-06 NOTE — Group Note (Signed)
LCSW Group Therapy Note   Group Date: 05/06/2021 Start Time: 1300 End Time: 1400   Type of Therapy and Topic:  Group Therapy: Coping Skills   Participation Level:  Active  Due to the acuity and complex discharge plans, group was not held. Patient was provided therapeutic worksheets and asked to meet with CSW as needed.  Darleen Crocker, LCSWA 05/06/2021  2:25 PM

## 2021-05-06 NOTE — Progress Notes (Signed)
Patient presents pleasant and cooperative. Medication compliant. Appropriate with staff and peers. Denies SI, HI, AVH. States she had a crazy day, reports she was given the wrong medication and bp was lowered. Reports she rested and is feeling fine now. Patient endorses anxiety and insomnia. Prn given. Noted in milieu, interacting appropriately with staff and peers.  Encouragement and support provided. Safety checks maintained. Medications given as prescribed. Pt receptive and remains safe on unit with q 15 min checks.

## 2021-05-06 NOTE — [facility] Group Notes (Signed)
Patient attended relaxation group. Patient was actively engaged and appropriate throughout.  

## 2021-05-06 NOTE — Plan of Care (Signed)
  Problem: Education: Goal: Emotional status will improve Outcome: Progressing Goal: Mental status will improve Outcome: Progressing   Problem: Activity: Goal: Interest or engagement in activities will improve Outcome: Progressing   Problem: Coping: Goal: Ability to verbalize frustrations and anger appropriately will improve Outcome: Progressing

## 2021-05-06 NOTE — Progress Notes (Signed)
[facility] MD Progress Note  05/06/2021 3:53 PM MARIELENA MARCONI  MRN:  [id number]  Subjective: Clare reports, My mood is kind of up & down today, I'm not over-joyed & I'm feeling sleepy today. It must be because of the medicines that were given to me this around 12:00 noon today. But, I was able to go to lunch. My mood is still up & down today, but I'm not depressed & I'm not having any anxiety symptoms. I have been to group session today".  Daily notes: Emonie is seen, chart reviewed. The chart findings dicussed with the treatment team. She presents alert, oriented & aware of situation. She is visible on the unit, attending group sessions. Patient reports her mood as up & down, not feeling over-joyed, but denies any symptoms of depression or anxiety. She reports feeling sleepy today, blamed it on the medicines she was given sometime around 12:00 noon today. She did report sleeping a lot yesterday even when she was at home. A review of her sleep hours indicated 6.75 hours per documentation. Patient is in bed during this evaluation, but alert & making a good eye contact.  She currently denies any SIHI, AVH, delusional thoughts or paranoia. She does not appear to be responding to any internal stimuli. We will continue to monitor patient to assure her comfort/safety. We will continue her current plan of care as already in progress. There are no changes made on her current plan of care.   Reason for admission: 50 year old AA female with hx of mental health issues & cocaine use disorders. Her previous admission in this bHH was 10 years ago". She is admitted to the [facility] this time with complain of worsening suicidal ideations of 3 days with plan to cut her wrist. She was brought to the hospital by her friend Zollie Beckers) for evaluation & treatments. Her UDS was positive for Cocaine.   Principal Problem: MDD (major depressive disorder), recurrent episode, severe (HCC)  Diagnosis: Principal Problem:   MDD (major  depressive disorder), recurrent episode, severe (HCC)  Total Time spent with patient:  35 minutes  Past Psychiatric History: Major depressive disorder, recurrent.  Past Medical History:  Past Medical History:  Diagnosis Date   Acid reflux    Anxiety    Asthma    Drug abuse (HCC)    ETOH abuse    Hypertension    Manic-depressive disorder (HCC)    PTSD (post-traumatic stress disorder)     Past Surgical History:  Procedure Laterality Date   TUBAL LIGATION     Family History:  Family History  Problem Relation Age of Onset   Diabetes Mother    Hypertension Mother    Family Psychiatric  History: See H&P.  Social History:  Social History   Substance and Sexual Activity  Alcohol Use Yes   Comment: in rehab     Social History   Substance and Sexual Activity  Drug Use Yes   Types: Cocaine   Comment: in rehab    Social History   Socioeconomic History   Marital status: Divorced    Spouse name: Not on file   Number of children: Not on file   Years of education: Not on file   Highest education level: Not on file  Occupational History   Not on file  Tobacco Use   Smoking status: Every Day    Packs/day: 0.50    Years: 29.00    Pack years: 14.50    Types: Cigarettes   Smokeless  tobacco: Never   Tobacco comments:    in rehab  Vaping Use   Vaping Use: Never used  Substance and Sexual Activity   Alcohol use: Yes    Comment: in rehab   Drug use: Yes    Types: Cocaine    Comment: in rehab   Sexual activity: Yes    Birth control/protection: None  Other Topics Concern   Not on file  Social History Narrative   Not on file   Social Determinants of Health   Financial Resource Strain: Not on file  Food Insecurity: Not on file  Transportation Needs: Not on file  Physical Activity: Not on file  Stress: Not on file  Social Connections: Not on file   Additional Social History:   Sleep: Good  Appetite:  Good  Current Medications: Current  Facility-Administered Medications  Medication Dose Route Frequency Provider Last Rate Last Admin   acetaminophen (TYLENOL) tablet 650 mg  650 mg Oral Q6H PRN Maryagnes Amos, FNP   650 mg at 05/04/21 0936   albuterol (VENTOLIN HFA) 108 (90 Base) MCG/ACT inhaler 1-2 puff  1-2 puff Inhalation Q6H PRN Maryagnes Amos, FNP   1 puff at 05/06/21 0954   alum & mag hydroxide-simeth (MAALOX/MYLANTA) 200-200-20 MG/5ML suspension 30 mL  30 mL Oral Q4H PRN Maryagnes Amos, FNP       ARIPiprazole (ABILIFY) tablet 5 mg  5 mg Oral Daily Armandina Stammer I, NP   5 mg at 05/06/21 0953   hydrOXYzine (ATARAX/VISTARIL) tablet 25 mg  25 mg Oral TID PRN Armandina Stammer I, NP   25 mg at 05/05/21 2059   magnesium hydroxide (MILK OF MAGNESIA) suspension 30 mL  30 mL Oral Daily PRN Maryagnes Amos, FNP       metoprolol succinate (TOPROL-XL) 24 hr tablet 12.5 mg  12.5 mg Oral Daily Maryagnes Amos, FNP   12.5 mg at 05/06/21 0952   montelukast (SINGULAIR) tablet 10 mg  10 mg Oral QHS Maryagnes Amos, FNP   10 mg at 05/05/21 2059   nicotine (NICODERM CQ - dosed in mg/24 hours) patch 21 mg  21 mg Transdermal Q0600 Maryagnes Amos, FNP       sertraline (ZOLOFT) tablet 50 mg  50 mg Oral Daily Armandina Stammer I, NP   50 mg at 05/06/21 4098   traZODone (DESYREL) tablet 100 mg  100 mg Oral QHS PRN Maryagnes Amos, FNP   100 mg at 05/05/21 2100    Lab Results:  Results for orders placed or performed during the hospital encounter of 05/03/21 (from the past 48 hour(s))  Urinalysis, Routine w reflex microscopic Urine, Clean Catch     Status: None   Collection Time: 05/04/21  5:18 PM  Result Value Ref Range   Color, Urine YELLOW YELLOW   APPearance CLEAR CLEAR   Specific Gravity, Urine 1.024 1.005 - 1.030   pH 6.0 5.0 - 8.0   Glucose, UA NEGATIVE NEGATIVE mg/dL   Hgb urine dipstick NEGATIVE NEGATIVE   Bilirubin Urine NEGATIVE NEGATIVE   Ketones, ur NEGATIVE NEGATIVE mg/dL   Protein,  ur NEGATIVE NEGATIVE mg/dL   Nitrite NEGATIVE NEGATIVE   Leukocytes,Ua NEGATIVE NEGATIVE    Comment: Performed at [facility], [address] [place], [place] [address]   Blood Alcohol level:  Lab Results  Component Value Date   [facility] <10 05/02/2021   ETH <11 10/19/2013   Metabolic Disorder Labs: Lab Results  Component Value Date  HGBA1C 6.2 (H) 05/04/2021   MPG 131.24 05/04/2021   MPG 129 09/02/2007   No results found for: PROLACTIN Lab Results  Component Value Date   CHOL 189 05/04/2021   TRIG 123 05/04/2021   HDL 67 05/04/2021   CHOLHDL 2.8 05/04/2021   VLDL 25 05/04/2021   LDLCALC 97 05/04/2021   LDLCALC  09/03/2007    77        Total Cholesterol/HDL:CHD Risk Coronary Heart Disease Risk Table                     Men   Women  1/2 Average Risk   3.4   3.3   Physical Findings: AIMS:  , ,  ,  ,    CIWA:    COWS:     Musculoskeletal: Strength & Muscle Tone: within normal limits Gait & Station: normal Patient leans: N/A  Psychiatric Specialty Exam:  Presentation  General Appearance: Appropriate for Environment; Casual; Fairly Groomed  Eye Contact:Good  Speech:Clear and Coherent; Normal Rate  Speech Volume:Normal  Handedness:Right   Mood and Affect  Mood:-- ("improving")  Affect:Congruent; Appropriate   Thought Process  Thought Processes:Coherent  Descriptions of Associations:Intact  Orientation:Full (Time, Place and Person)  Thought Content:Logical  History of Schizophrenia/Schizoaffective disorder:No  Duration of Psychotic Symptoms:No data recorded Hallucinations:Hallucinations: None  Ideas of Reference:None  Suicidal Thoughts:Suicidal Thoughts: No  Homicidal Thoughts:Homicidal Thoughts: No   Sensorium  Memory:Immediate Good; Recent Good; Remote Fair  Judgment:Fair  Insight:Present  Executive Functions  Concentration:Good  Attention Span:Good  Recall:Fair  Fund of  Knowledge:Fair  Language:Good  Psychomotor Activity  Psychomotor Activity:Psychomotor Activity: Normal  Assets  Assets:Communication Skills; Desire for Improvement; Financial Resources/Insurance; Housing; Physical Health; Resilience; Social Support  Sleep  Sleep:Sleep: Good Number of Hours of Sleep: 6.75  Physical Exam: Physical Exam Vitals and nursing note reviewed.  HENT:     Mouth/Throat:     Pharynx: Oropharynx is clear.  Cardiovascular:     Rate and Rhythm: Normal rate.     Pulses: Normal pulses.  Pulmonary:     Effort: Pulmonary effort is normal.  Genitourinary:    Comments: Deferred Musculoskeletal:        General: Normal range of motion.     Cervical back: Normal range of motion.  Skin:    General: Skin is warm and dry.  Neurological:     General: No focal deficit present.     Mental Status: She is alert and oriented to person, place, and time.   Review of Systems  Constitutional:  Negative for chills and fever.  HENT:  Negative for congestion and sore throat.   Respiratory:  Negative for cough, shortness of breath and wheezing.   Cardiovascular:  Negative for chest pain and palpitations.  Gastrointestinal:  Negative for abdominal pain, diarrhea, heartburn, nausea and vomiting.  Genitourinary:  Negative for dysuria.  Musculoskeletal:  Negative for joint pain and myalgias.  Skin: Negative.   Neurological:  Negative for dizziness, tingling, tremors, sensory change, speech change, focal weakness, seizures, loss of consciousness, weakness and headaches.  Endo/Heme/Allergies:        Allergies: Benadryl  Latex allergy.  Psychiatric/Behavioral:  Positive for depression ("Improving") and substance abuse (Hx. cocaine use disorder.). Negative for hallucinations, memory loss and suicidal ideas. The patient is not nervous/anxious and does not have insomnia.   Blood pressure (!) 144/81, pulse 65, temperature 98.3 F (36.8 C), temperature source Oral, resp. rate 18,  height 5\' 2"  (1.575 m), weight 52 kg,  SpO2 98 %. Body mass index is 20.97 kg/m.  Treatment Plan Summary: Daily contact with patient to assess and evaluate symptoms and progress in treatment and Medication management.   Continue inpatient hospitalization. Will continue today 05/06/2021 plan as below except where it is noted.   Diagnoses.  Major depressive disorder, recurrent, r/o Bipolar disorder.  Cocaine use disorder.    2. Medication management to reduce current symptoms to base line and improve the patient's overall level of functioning: See [facility] for plan of care.   Major depressive disorder.  Continue Sertraline 50 mg po daily. Continue Abilify 5 mg po daily to augment Sertraline.    Anxiety.  Continue Vistaril 25 mg po tid prn.    Insomnia. Continue Trazodone 100 mg po Q hs prn.   Other medical issues/complaints, continue.  Metoprolol 12.5 mg po daily for HTN.  Nicotine patch 21 mg po trans-dermally Q 24 hrs for nicotine withdrawal. Albuterol inhaler 1-2 puff Q 6 hrs prn for SOB. Singulair 10 mg po daily for Asthma. Acetaminophen 650 mg po Q 6 hrs prn for pain/fever. Mylanta 30 ml po Q 4 hrs prn for indigestion.  MOM 30 ml po Q daily prn for constipation.  Encourage group milieu attendance & participation. Discharge disposition plans is ongoing per Child psychotherapist.  Armandina Stammer, NP, pmhnp, fnp-np 05/06/2021, 3:53 PM Patient ID: [name], female   DOB: 09-04-1970, 50 y.o.   MRN: [id number]

## 2021-05-07 MED ORDER — HYDROXYZINE HCL 25 MG PO TABS
25.0000 mg | ORAL_TABLET | Freq: Once | ORAL | Status: AC
  Filled 2021-05-07: qty 1

## 2021-05-07 NOTE — Group Note (Signed)
LCSW Group Therapy Note  05/07/2021    10:00-11:00am   Type of Therapy and Topic:  Group Therapy: Early Messages Received About Anger  Participation Level:  Active   Description of Group:   In this group, patients shared and discussed the early messages received in their lives about anger through parental or other adult modeling, teaching, repression, punishment, violence, and more.  Participants identified how those childhood lessons influence even now how they usually or often react when angered.  The group discussed that anger is a secondary emotion and what may be the underlying emotional themes that come out through anger outbursts or that are ignored through anger suppression.    Therapeutic Goals: Patients will identify one or more childhood message about anger that they received and how it was taught to them. Patients will discuss how these childhood experiences have influenced and continue to influence their own expression or repression of anger even today. Patients will explore possible primary emotions that tend to fuel their secondary emotion of anger. Patients will learn that anger itself is normal and cannot be eliminated, and that healthier coping skills can assist with resolving conflict rather than worsening situations.  Summary of Patient Progress:  The patient shared that her childhood lessons about anger were from her grandmother who taught the patient that God does not like anger.  As a result, she will allow anger to build up and will refuse to let it out, saying she does not know how.  She said she has not cried for 2 years, for instance.  She stated to the group that anger is on the surface, but what is underneath is pain.  This reverberated with a number of other patients.  She expressed having a complete inability to allow herself to express any feelings.  The patient participated fully and demonstrated insight.  Therapeutic Modalities:   Cognitive Behavioral  Therapy Motivation Interviewing  Maretta Los  .

## 2021-05-07 NOTE — Progress Notes (Signed)
   05/07/21 2000  Psych Admission Type (Psych Patients Only)  Admission Status Voluntary  Psychosocial Assessment  Patient Complaints Other (Comment) (plantar wart right foot)  Eye Contact Fair  Facial Expression Anxious  Affect Anxious  Speech Soft  Interaction Cautious  Motor Activity Slow  Appearance/Hygiene Unremarkable  Behavior Characteristics Cooperative;Anxious  Mood Pleasant  Thought Process  Coherency WDL  Content WDL  Delusions WDL  Perception WDL  Hallucination None reported or observed  Judgment Poor  Confusion WDL  Danger to Self  Current suicidal ideation? Denies  Danger to Others  Danger to Others None reported or observed   Pt seen speaking with other peers. Pt denies SI, HI, AVH. Pt endorses pain 9/10 d/t a plantar wart on her right foot. Pt says that Tylenol doesn't work for her pain. Given cold pack to try on her foot. Pt denies anxiety and depression. Says she woke up twice last night. Didn't know she could come out and ask for additional sleep medicine. Pt says Trazodone doesn't work for her anymore. "I think my body is used to it." Pt said Vistaril is okay. Would like an additional dose of Vistaril if she wakes up tonight. Provider notified.

## 2021-05-07 NOTE — Group Note (Signed)
Date:  05/07/2021 Time:  9:52 AM  Group Topic/Focus:  Goals Group:   The focus of this group is to help patients establish daily goals to achieve during treatment and discuss how the patient can incorporate goal setting into their daily lives to aide in recovery. Orientation:   The focus of this group is to educate the patient on the purpose and policies of crisis stabilization and provide a format to answer questions about their admission.  The group details unit policies and expectations of patients while admitted.    Participation Level:  Active  Participation Quality:  Appropriate  Affect:  Appropriate  Cognitive:  Alert and Appropriate  Insight: Appropriate and Good  Engagement in Group:  Engaged  Modes of Intervention:  Discussion  Additional Comments:  Pt has a goal of opening up to peers and staff as well as being tin touch with her emotions.   Tyrell Antonio Miliana Gangwer 05/07/2021, 9:52 AM

## 2021-05-07 NOTE — [facility] Group Notes (Signed)
1600 - PsychoEducational group- Patients were educated on the power of negative versus positive thinking, and the habits of both and impact on our mental health. Patients were educated on positive re-framing and given examples in day to day life. Pts were given example by showing spaghetti noodles, while together, one thought is not much, over and over becomes more powerful, and harder to break. Patients were then asked to break a noodle and give an example of a negative thought in their life they needed to break free from. Pt participated and was appropriate. 

## 2021-05-07 NOTE — Group Note (Signed)
Date:  05/07/2021 Time:  10:56 AM  Number of Participants: 10  Group Focus: daily focus Treatment Modality:  Psychoeducation Interventions utilized were group exercise Purpose: express feelings and increase insight   Participation Level:  Pt attended psycho-ed group with RN.

## 2021-05-07 NOTE — Progress Notes (Signed)
Pt presents with depressed mood, affect congruent. Pt has been more interactive on the unit today, pleasant and cooperative. She states she knows she needs to open up more and share her feelings to progress. She denies any SI /HI. Denies A/V Hallucinations. No signs of acute decompensation. Pt has been compliant with medications. Visible on the unit and is safe. Will con't to monitor.

## 2021-05-07 NOTE — Progress Notes (Addendum)
[facility] MD Progress Note  05/07/2021 3:58 PM [name]  MRN:  [id number]  Subjective: "  I am doing much better today.  No side effect from the wrong medications today"  Daily notes: Records reviewed, report received and care was reviewed with members of our interdisciplinary team.  Patient was seen in her room this afternoon and she reported upbeat mood.  She denied feeling depressed and rated depression 0/10 with 10 being severe depression.  About the wrong medication yesterday she reported no side effect today.  EKG was reported today and QTC interval gradually decreasing towards normal-488 today.  No chest pain and V/S are wnl limit.  She is happy she is receiving adequate care.  She denied SI/HI/AVH and no mention of paranoia.  Reason for admission: 50 year old AA female with hx of mental health issues & cocaine use disorders. Her previous admission in this [facility] was 10 years ago". She is admitted to the [facility] this time with complain of worsening suicidal ideations of 3 days with plan to cut her wrist. She was brought to the hospital by her friend Thayer Jew) for evaluation & treatments. Her UDS was positive for Cocaine.   Principal Problem: MDD (major depressive disorder), recurrent episode, severe ([place])  Diagnosis: Principal Problem:   MDD (major depressive disorder), recurrent episode, severe ([place])  Total Time spent with patient: 30 minutes  Past Psychiatric History: Major depressive disorder, recurrent.  Past Medical History:  Past Medical History:  Diagnosis Date   Acid reflux    Anxiety    Asthma    Drug abuse ([place])    ETOH abuse    Hypertension    Manic-depressive disorder ([place])    PTSD (post-traumatic stress disorder)     Past Surgical History:  Procedure Laterality Date   TUBAL LIGATION     Family History:  Family History  Problem Relation Age of Onset   Diabetes Mother    Hypertension Mother    Family Psychiatric  History: See H&P.  Social History:  Social  History   Substance and Sexual Activity  Alcohol Use Yes   Comment: in rehab     Social History   Substance and Sexual Activity  Drug Use Yes   Types: Cocaine   Comment: in rehab    Social History   Socioeconomic History   Marital status: Divorced    Spouse name: Not on file   Number of children: Not on file   Years of education: Not on file   Highest education level: Not on file  Occupational History   Not on file  Tobacco Use   Smoking status: Every Day    Packs/day: 0.50    Years: 29.00    Pack years: 14.50    Types: Cigarettes   Smokeless tobacco: Never   Tobacco comments:    in rehab  Vaping Use   Vaping Use: Never used  Substance and Sexual Activity   Alcohol use: Yes    Comment: in rehab   Drug use: Yes    Types: Cocaine    Comment: in rehab   Sexual activity: Yes    Birth control/protection: None  Other Topics Concern   Not on file  Social History Narrative   Not on file   Social Determinants of Health   Financial Resource Strain: Not on file  Food Insecurity: Not on file  Transportation Needs: Not on file  Physical Activity: Not on file  Stress: Not on file  Social Connections: Not  on file   Additional Social History:   Sleep: Good  Appetite:  Good  Current Medications: Current Facility-Administered Medications  Medication Dose Route Frequency Provider Last Rate Last Admin   acetaminophen (TYLENOL) tablet 650 mg  650 mg Oral Q6H PRN [name], FNP   650 mg at 05/04/21 0936   albuterol (VENTOLIN HFA) 108 (90 Base) MCG/ACT inhaler 1-2 puff  1-2 puff Inhalation Q6H PRN [name], FNP   2 puff at 05/07/21 0907   alum & mag hydroxide-simeth (MAALOX/MYLANTA) 200-200-20 MG/5ML suspension 30 mL  30 mL Oral Q4H PRN [name], FNP       ARIPiprazole (ABILIFY) tablet 5 mg  5 mg Oral Daily Nwoko, Agnes I, NP   5 mg at 05/07/21 0817   hydrOXYzine (ATARAX/VISTARIL) tablet 25 mg  25 mg Oral TID PRN Lindell Spar I,  NP   25 mg at 05/06/21 2126   magnesium hydroxide (MILK OF MAGNESIA) suspension 30 mL  30 mL Oral Daily PRN [name], FNP       metoprolol succinate (TOPROL-XL) 24 hr tablet 12.5 mg  12.5 mg Oral Daily Sheran Fava S, FNP   12.5 mg at 05/07/21 0818   montelukast (SINGULAIR) tablet 10 mg  10 mg Oral QHS [name], FNP   10 mg at 05/06/21 2126   nicotine (NICODERM CQ - dosed in mg/24 hours) patch 21 mg  21 mg Transdermal Q0600 [name], FNP       sertraline (ZOLOFT) tablet 50 mg  50 mg Oral Daily Nwoko, Herbert Pun I, NP   50 mg at 05/07/21 0817   traZODone (DESYREL) tablet 100 mg  100 mg Oral QHS PRN [name], FNP   100 mg at 05/06/21 2126    Lab Results:  No results found for this or any previous visit (from the past 48 hour(s)).  Blood Alcohol level:  Lab Results  Component Value Date   [facility] <10 05/02/2021   ETH <11 17/61/6073   Metabolic Disorder Labs: Lab Results  Component Value Date   HGBA1C 6.2 (H) 05/04/2021   MPG 131.24 05/04/2021   MPG 129 09/02/2007   No results found for: PROLACTIN Lab Results  Component Value Date   CHOL 189 05/04/2021   TRIG 123 05/04/2021   HDL 67 05/04/2021   CHOLHDL 2.8 05/04/2021   VLDL 25 05/04/2021   LDLCALC 97 05/04/2021   LDLCALC  09/03/2007    77        Total Cholesterol/HDL:CHD Risk Coronary Heart Disease Risk Table                     Men   Women  1/2 Average Risk   3.4   3.3   Physical Findings: AIMS:  , ,  ,  ,    CIWA:    COWS:     Musculoskeletal: Strength & Muscle Tone: within normal limits Gait & Station: normal Patient leans: N/A  Psychiatric Specialty Exam:  Presentation  General Appearance: Appropriate for Environment; Fairly Groomed; Neat  Eye Contact:Good  Speech:Clear and Coherent; Normal Rate  Speech Volume:Normal  Handedness:Right   Mood and Affect  Mood:Euthymic  Affect:Congruent   Thought Process  Thought Processes:Goal Directed;  Coherent  Descriptions of Associations:Intact  Orientation:Full (Time, Place and Person)  Thought Content:Logical  History of Schizophrenia/Schizoaffective disorder:No  Duration of Psychotic Symptoms:No data recorded Hallucinations:Hallucinations: None  Ideas of Reference:None  Suicidal Thoughts:Suicidal Thoughts: No  Homicidal Thoughts:Homicidal Thoughts:  No   Sensorium  Memory:Immediate Good; Recent Good; Remote Good  Judgment:Good  Insight:Good  Executive Functions  Concentration:Good  Attention Span:Good  Smiley of Knowledge:Good  Language:Good  Psychomotor Activity  Psychomotor Activity:Psychomotor Activity: Normal  Assets  Assets:Communication Skills; Desire for Improvement; Housing; Catering manager; Physical Health; Resilience; Social Support  Sleep  Sleep:Number of Hours of Sleep: 6.75  Physical Exam: Physical Exam Vitals and nursing note reviewed.  HENT:     Mouth/Throat:     Pharynx: Oropharynx is clear.  Cardiovascular:     Rate and Rhythm: Normal rate.     Pulses: Normal pulses.  Pulmonary:     Effort: Pulmonary effort is normal.  Genitourinary:    Comments: Deferred Musculoskeletal:        General: Normal range of motion.     Cervical back: Normal range of motion.  Skin:    General: Skin is warm and dry.  Neurological:     General: No focal deficit present.     Mental Status: She is alert and oriented to person, place, and time.   Review of Systems  Constitutional:  Negative for chills and fever.  HENT:  Negative for congestion and sore throat.   Respiratory:  Negative for cough, shortness of breath and wheezing.   Cardiovascular:  Negative for chest pain and palpitations.  Gastrointestinal:  Negative for abdominal pain, diarrhea, heartburn, nausea and vomiting.  Genitourinary:  Negative for dysuria.  Musculoskeletal:  Negative for joint pain and myalgias.  Skin: Negative.   Neurological:  Negative for  dizziness, tingling, tremors, sensory change, speech change, focal weakness, seizures, loss of consciousness, weakness and headaches.  Endo/Heme/Allergies:        Allergies: Benadryl  Latex allergy.  Psychiatric/Behavioral:  Positive for depression ("Improving") and substance abuse (Hx. cocaine use disorder.). Negative for hallucinations, memory loss and suicidal ideas. The patient is not nervous/anxious and does not have insomnia.   Blood pressure 113/76, pulse 89, temperature 97.8 F (36.6 C), temperature source Oral, resp. rate 18, height 5\' 2"  (1.575 m), weight 52 kg, SpO2 98 %. Body mass index is 20.97 kg/m.  Treatment Plan Summary: Daily contact with patient to assess and evaluate symptoms and progress in treatment and Medication management.   Continue inpatient hospitalization. Will continue today 05/07/2021 plan as below except where it is noted.   Diagnoses.  Major depressive disorder, recurrent, r/o Bipolar disorder.  Cocaine use disorder.    2. Medication management to reduce current symptoms to base line and improve the patient's overall level of functioning:    Major depressive disorder.  Continue Sertraline 50 mg po daily. Continue Abilify 5 mg po daily to augment Sertraline.    Anxiety.  Continue Vistaril 25 mg po tid prn.    Insomnia. Continue Trazodone 100 mg po Q hs prn.   Other medical issues/complaints, continue.  Metoprolol 12.5 mg po daily for HTN.  Nicotine patch 21 mg po trans-dermally Q 24 hrs for nicotine withdrawal. Albuterol inhaler 1-2 puff Q 6 hrs prn for SOB. Singulair 10 mg po daily for Asthma. Acetaminophen 650 mg po Q 6 hrs prn for pain/fever. Mylanta 30 ml po Q 4 hrs prn for indigestion.  MOM 30 ml po Q daily prn for constipation.  Encourage group milieu attendance & participation. Discharge disposition plans is ongoing per Education officer, museum.  Delfin Gant, NP, PMHNP-BC 05/07/2021, 3:58 PM

## 2021-05-08 NOTE — Group Note (Signed)
[facility] LCSW Group Therapy Note  05/08/2021   10:00-11:00AM  Type of Therapy and Topic:  Group Therapy:  Unhealthy versus Healthy Supports, Which Am I?  Participation Level:  Active   Description of Group:  Patients in this group were introduced to the concept that additional supports including self-support are an essential part of recovery.  Initially a discussion was held about the differences between healthy versus unhealthy supports.  Patients were asked to share what unhealthy supports in their lives need to be addressed, as well as what additional healthy supports could be added for greater help in reaching their goals.   A song entitled "My Own Hero" was played and a group discussion ensued in which patients stated they could relate to the song and it inspired them to realize they have be willing to help themselves in order to succeed, because other people cannot achieve sobriety or stability for them.  We discussed adding a variety of healthy supports to address the various needs in patient lives, including becoming more self-supportive.  A song was played called "I Know Where I've Been" toward the end of group and used to conduct an inspirational wrap-up to group of remembering how far they have already come in their journey.  Therapeutic Goals: 1)  Highlight the differences between healthy and unhealthy supports 2)  Suggest the importance of being a part of one's own support system 2)  Discuss reasons people in one's life may eventually be unable to be continually supportive  3)  Identify the patient's current support system   4) Elicit commitments to add healthy supports and to become more conscious of being self-supportive   Summary of Patient Progress:  The patient participated fully and demonstrated her vulnerability throughout group.  She was tearful at times and had to stop speaking.  She spoke of needing positive support from others just as she was very close to reaching the person she  wants to be, and lamented that she has not had that kind of support previously.  She was encouraged to be a "hero" to herself by actively searching for the kind of support she now knows that she needs.  She was also encouraged to tell people in her life very specifically what she needs from them.  Therapeutic Modalities:   Motivational Interviewing Activity  Maretta Los , MSW, LCSW

## 2021-05-08 NOTE — Plan of Care (Signed)
Cooperative and active in the milieu. Attending groups and getting along with peers. Denied SI/HI/AVH. Slept fine. No sign of distress.

## 2021-05-08 NOTE — Progress Notes (Signed)
Pt given Vistaril 25 mg d/t anxiety and wakefulness.

## 2021-05-08 NOTE — [facility] Group Notes (Signed)
Adult Psychoeducational Group Not Date:  05/08/2021 Time:  0900-1045 Group Topic/Focus: PROGRESSIVE RELAXATION. A group where deep breathing is taught and tensing and relaxation muscle groups is used. Imagery is used as well.  Pts are asked to imagine 3 pillars that hold them up when they are not able to hold themselves up.  Participation Level:  Active  Participation Quality:  Appropriate  Affect:  Appropriate  Cognitive:  Oriented  Insight: Improving  Engagement in Group:  Engaged  Modes of Intervention:  Activity, Discussion, Education, and Support  Additional Comments:  rates her energy at a 6/10. God , her mother and family are her pillars  Paulino Rily

## 2021-05-08 NOTE — [facility] Group Notes (Signed)
Psychoeducational Group Note  Date:  05/08/2021 Time:  1300-1400   Group Topic/Focus: This is a continuation of the group from Saturday. Pt's have been asked to formulate a list of 30 positives about themselves. This list is to be read 2 times a day for 30 days, looking in a mirror. Changing patterns of negative self talk. Also discussed is the fact that there have been some people who hurt [place] in the past. We keep that memory alive within [place]. Ways to cope with this are discused   Participation Level:  did not attend Paulino Rily

## 2021-05-08 NOTE — Progress Notes (Signed)
   05/08/21 2111  Psych Admission Type (Psych Patients Only)  Admission Status Voluntary  Psychosocial Assessment  Patient Complaints None  Eye Contact Fair  Facial Expression Anxious;Animated  Affect Appropriate to circumstance  Speech Logical/coherent;Soft  Interaction Assertive  Motor Activity Slow  Appearance/Hygiene Unremarkable  Behavior Characteristics Cooperative;Appropriate to situation  Mood Pleasant  Thought Process  Coherency WDL  Content WDL  Delusions None reported or observed  Perception WDL  Hallucination None reported or observed  Judgment Poor  Confusion None  Danger to Self  Current suicidal ideation? Denies  Self-Injurious Behavior No self-injurious ideation or behavior indicators observed or expressed   Agreement Not to Harm Self Yes  Description of Agreement Verbal Contract  Danger to Others  Danger to Others None reported or observed

## 2021-05-08 NOTE — Progress Notes (Signed)
Adult Psychoeducational Group Note  Date:  05/08/2021 Time:  10:22 PM  Group Topic/Focus:  Wrap-Up Group:   The focus of this group is to help patients review their daily goal of treatment and discuss progress on daily workbooks.  Participation Level:  Active  Participation Quality:  Appropriate  Affect:  Appropriate  Cognitive:  Appropriate  Insight: Appropriate  Engagement in Group:  Engaged  Modes of Intervention:  Discussion  Additional Comments:Pt stated she met her goal for the day.  Pt stated her goal for the day was to work on discharge.  Tonia Brooms D 05/08/2021, 10:22 PM

## 2021-05-08 NOTE — [facility] Group Notes (Signed)
Pt didn't attend goals group 

## 2021-05-08 NOTE — Progress Notes (Signed)
[facility] MD Progress Note  05/08/2021 11:29 AM [name]  MRN:  [id number]  Subjective: "  I am doing much better today.  No side effect from the wrong medications today"  Daily notes: Records reviewed, report received and care was reviewed with members of our interdisciplinary team.  Patient was seen in the office this morning presented a bright affect with a smile.  She rated her depression 3/10 with 10 being severe depression.  She plans to continue taking all her medications at home although she is worried about cost.  She want to know if Medicaid will pay for her medications.  She reported better sleep last night and appetite remains good.  She continues to deny side effect from taking the wrong medications on Friday.  She participates in every group activity.  Patient Denied SI/HI/AVH and no mention of paranoia.  She is for discharge tomorrow. We discussed the need to avoid using Cocaine or any illicit drug.  Reason for admission: 50 year old AA female with hx of mental health issues & cocaine use disorders. Her previous admission in this b[facility] was 10 years ago". She is admitted to the [facility] this time with complain of worsening suicidal ideations of 3 days with plan to cut her wrist. She was brought to the hospital by her friend Thayer Jew) for evaluation & treatments. Her UDS was positive for Cocaine.   Principal Problem: MDD (major depressive disorder), recurrent episode, severe ([place])  Diagnosis: Principal Problem:   MDD (major depressive disorder), recurrent episode, severe ([place])  Total Time spent with patient: 20 minutes  Past Psychiatric History: Major depressive disorder, recurrent.  Past Medical History:  Past Medical History:  Diagnosis Date   Acid reflux    Anxiety    Asthma    Drug abuse ([place])    ETOH abuse    Hypertension    Manic-depressive disorder ([place])    PTSD (post-traumatic stress disorder)     Past Surgical History:  Procedure Laterality Date   TUBAL LIGATION      Family History:  Family History  Problem Relation Age of Onset   Diabetes Mother    Hypertension Mother    Family Psychiatric  History: See H&P.  Social History:  Social History   Substance and Sexual Activity  Alcohol Use Yes   Comment: in rehab     Social History   Substance and Sexual Activity  Drug Use Yes   Types: Cocaine   Comment: in rehab    Social History   Socioeconomic History   Marital status: Divorced    Spouse name: Not on file   Number of children: Not on file   Years of education: Not on file   Highest education level: Not on file  Occupational History   Not on file  Tobacco Use   Smoking status: Every Day    Packs/day: 0.50    Years: 29.00    Pack years: 14.50    Types: Cigarettes   Smokeless tobacco: Never   Tobacco comments:    in rehab  Vaping Use   Vaping Use: Never used  Substance and Sexual Activity   Alcohol use: Yes    Comment: in rehab   Drug use: Yes    Types: Cocaine    Comment: in rehab   Sexual activity: Yes    Birth control/protection: None  Other Topics Concern   Not on file  Social History Narrative   Not on file   Social Determinants of Health  Financial Resource Strain: Not on file  Food Insecurity: Not on file  Transportation Needs: Not on file  Physical Activity: Not on file  Stress: Not on file  Social Connections: Not on file   Additional Social History:   Sleep: Good  Appetite:  Good  Current Medications: Current Facility-Administered Medications  Medication Dose Route Frequency Provider Last Rate Last Admin   acetaminophen (TYLENOL) tablet 650 mg  650 mg Oral Q6H PRN [name], FNP   650 mg at 05/07/21 1638   albuterol (VENTOLIN HFA) 108 (90 Base) MCG/ACT inhaler 1-2 puff  1-2 puff Inhalation Q6H PRN [name], FNP   2 puff at 05/07/21 0907   alum & mag hydroxide-simeth (MAALOX/MYLANTA) 200-200-20 MG/5ML suspension 30 mL  30 mL Oral Q4H PRN [name], FNP        ARIPiprazole (ABILIFY) tablet 5 mg  5 mg Oral Daily Nwoko, Agnes I, NP   5 mg at 05/08/21 0953   hydrOXYzine (ATARAX/VISTARIL) tablet 25 mg  25 mg Oral TID PRN Lindell Spar I, NP   25 mg at 05/08/21 0048   magnesium hydroxide (MILK OF MAGNESIA) suspension 30 mL  30 mL Oral Daily PRN [name], FNP       metoprolol succinate (TOPROL-XL) 24 hr tablet 12.5 mg  12.5 mg Oral Daily [name], FNP   12.5 mg at 05/08/21 0954   montelukast (SINGULAIR) tablet 10 mg  10 mg Oral QHS [name], FNP   10 mg at 05/07/21 2104   nicotine (NICODERM CQ - dosed in mg/24 hours) patch 21 mg  21 mg Transdermal Q0600 [name], FNP       sertraline (ZOLOFT) tablet 50 mg  50 mg Oral Daily Nwoko, Herbert Pun I, NP   50 mg at 05/08/21 0953   traZODone (DESYREL) tablet 100 mg  100 mg Oral QHS PRN [name], FNP   100 mg at 05/07/21 2105    Lab Results:  No results found for this or any previous visit (from the past 48 hour(s)).  Blood Alcohol level:  Lab Results  Component Value Date   [facility] <10 05/02/2021   ETH <11 22/29/7989   Metabolic Disorder Labs: Lab Results  Component Value Date   HGBA1C 6.2 (H) 05/04/2021   MPG 131.24 05/04/2021   MPG 129 09/02/2007   No results found for: PROLACTIN Lab Results  Component Value Date   CHOL 189 05/04/2021   TRIG 123 05/04/2021   HDL 67 05/04/2021   CHOLHDL 2.8 05/04/2021   VLDL 25 05/04/2021   LDLCALC 97 05/04/2021   LDLCALC  09/03/2007    77        Total Cholesterol/HDL:CHD Risk Coronary Heart Disease Risk Table                     Men   Women  1/2 Average Risk   3.4   3.3   Physical Findings: AIMS:  , ,  ,  ,    CIWA:    COWS:     Musculoskeletal: Strength & Muscle Tone: within normal limits Gait & Station: normal Patient leans: N/A  Psychiatric Specialty Exam:  Presentation  General Appearance: Appropriate for Environment; Fairly Groomed; Neat  Eye Contact:Good  Speech:Clear and  Coherent; Normal Rate  Speech Volume:Normal  Handedness:Right   Mood and Affect  Mood:Euthymic  Affect:Congruent   Thought Process  Thought Processes:Coherent; Goal Directed  Descriptions of Associations:Intact  Orientation:Full (Time,  Place and Person)  Thought Content:Logical  History of Schizophrenia/Schizoaffective disorder:No  Duration of Psychotic Symptoms:No data recorded Hallucinations:Hallucinations: None  Ideas of Reference:None  Suicidal Thoughts:Suicidal Thoughts: No  Homicidal Thoughts:Homicidal Thoughts: No   Sensorium  Memory:Immediate Good; Recent Good  Judgment:Good  Insight:Good  Executive Functions  Concentration:Good  Attention Span:Good  [place] of Knowledge:Good  Language:Good  Psychomotor Activity  Psychomotor Activity:Psychomotor Activity: Normal  Assets  Assets:Communication Skills; Desire for Improvement; Social Support; Housing; Resilience  Sleep  Sleep:Sleep: Good Number of Hours of Sleep: 6.75  Physical Exam: Physical Exam Vitals and nursing note reviewed.  HENT:     Mouth/Throat:     Pharynx: Oropharynx is clear.  Cardiovascular:     Rate and Rhythm: Normal rate.     Pulses: Normal pulses.  Pulmonary:     Effort: Pulmonary effort is normal.  Genitourinary:    Comments: Deferred Musculoskeletal:        General: Normal range of motion.     Cervical back: Normal range of motion.  Skin:    General: Skin is warm and dry.  Neurological:     General: No focal deficit present.     Mental Status: She is alert and oriented to person, place, and time.   Review of Systems  Constitutional:  Negative for chills and fever.  HENT:  Negative for congestion and sore throat.   Respiratory:  Negative for cough, shortness of breath and wheezing.   Cardiovascular:  Negative for chest pain and palpitations.  Gastrointestinal:  Negative for abdominal pain, diarrhea, heartburn, nausea and vomiting.   Genitourinary:  Negative for dysuria.  Musculoskeletal:  Negative for joint pain and myalgias.  Skin: Negative.   Neurological:  Negative for dizziness, tingling, tremors, sensory change, speech change, focal weakness, seizures, loss of consciousness, weakness and headaches.  Endo/Heme/Allergies:        Allergies: Benadryl  Latex allergy.  Psychiatric/Behavioral:  Negative for hallucinations, memory loss and suicidal ideas. Depression: "Improving". Substance abuse: Hx. cocaine use disorder..The patient is not nervous/anxious and does not have insomnia.   Blood pressure (!) 128/94, pulse 87, temperature 98.1 F (36.7 C), temperature source Oral, resp. rate 18, height 5\' 2"  (1.575 m), weight 52 kg, SpO2 99 %. Body mass index is 20.97 kg/m.  Treatment Plan Summary: Daily contact with patient to assess and evaluate symptoms and progress in treatment and Medication management.   Continue inpatient hospitalization. Will continue today 05/08/2021 plan as below except where it is noted.   Diagnoses.  Major depressive disorder, recurrent, r/o Bipolar disorder.  Cocaine use disorder.    2. Medication management to reduce current symptoms to base line and improve the patient's overall level of functioning:    Major depressive disorder.  Continue Sertraline 50 mg po daily. Continue Abilify 5 mg po daily to augment Sertraline.    Anxiety.  Continue Vistaril 25 mg po tid prn.    Insomnia. Continue Trazodone 100 mg po Q hs prn.   Other medical issues/complaints, continue.  Metoprolol 12.5 mg po daily for HTN.  Nicotine patch 21 mg po trans-dermally Q 24 hrs for nicotine withdrawal. Albuterol inhaler 1-2 puff Q 6 hrs prn for SOB. Singulair 10 mg po daily for Asthma. Acetaminophen 650 mg po Q 6 hrs prn for pain/fever. Mylanta 30 ml po Q 4 hrs prn for indigestion.  MOM 30 ml po Q daily prn for constipation.  Encourage group milieu attendance & participation. Discharge disposition plans  is ongoing per social worker- Discharge  is planned for tomorrow.  Delfin Gant, NP, PMHNP-BC 05/08/2021, 11:29 AM

## 2021-05-09 ENCOUNTER — Other Ambulatory Visit (HOSPITAL_COMMUNITY): Payer: Self-pay | Admitting: Psychiatry

## 2021-05-09 ENCOUNTER — Encounter (HOSPITAL_COMMUNITY): Payer: Self-pay

## 2021-05-09 DIAGNOSIS — F411 Generalized anxiety disorder: Secondary | ICD-10-CM

## 2021-05-09 MED ORDER — HYDROXYZINE HCL 25 MG PO TABS: 25.0000 mg | ORAL_TABLET | Freq: Three times a day (TID) | ORAL | 0 refills | Status: DC | PRN

## 2021-05-09 MED ORDER — NICOTINE 21 MG/24HR TD PT24: 21.0000 mg | MEDICATED_PATCH | Freq: Every day | TRANSDERMAL | 0 refills | Status: DC

## 2021-05-09 MED ORDER — TRAZODONE HCL 100 MG PO TABS: 100.0000 mg | ORAL_TABLET | Freq: Every evening | ORAL | 0 refills | Status: DC | PRN

## 2021-05-09 MED ORDER — METOPROLOL SUCCINATE ER 25 MG PO TB24: 12.5000 mg | ORAL_TABLET | Freq: Every day | ORAL | 0 refills | Status: DC

## 2021-05-09 MED ORDER — SERTRALINE HCL 50 MG PO TABS: 50.0000 mg | ORAL_TABLET | Freq: Every day | ORAL | 0 refills | Status: DC

## 2021-05-09 MED ORDER — ARIPIPRAZOLE 5 MG PO TABS
5.0000 mg | ORAL_TABLET | Freq: Every day | ORAL | 0 refills | Status: DC
Start: 2021-05-10 — End: 2021-08-07

## 2021-05-09 NOTE — B[facility] Suicide Risk Assessment (Signed)
[facility] Discharge Suicide Risk Assessment   Principal Problem: MDD (major depressive disorder), recurrent episode, severe ([place]) Discharge Diagnoses: Principal Problem:   MDD (major depressive disorder), recurrent episode, severe ([place]) Active Problems:   Polysubstance abuse ([facility])   GAD (generalized anxiety disorder)   Total Time spent with patient: 20 minutes  Patient is a 50 year old female with a psychiatric history of "depression and bipolar disorder" and a history of multiple psychiatric hospitalizations, was admitted to the psychiatric unit for evaluation and treatment of depression and suicidal thoughts with plan to cut wrist.  During the patient's hospitalization, patient had extensive initial psychiatric evaluation, and follow-up psychiatric evaluations every day.  Psychiatric diagnoses provided upon initial assessment:  Bipolar disorder versus major depressive disorder.  Patient has been through his diagnosis in the medical records.  Patient also reports herself, having both these diagnoses.  We will continue to monitor current psychiatric symptoms, and treat accordingly. Generalized anxiety disorder Cocaine use disorder    Patient's psychiatric medications were adjusted on admission:  Start sertraline 50 mg once daily for mood and anxiety Start Abilify 5 mg once daily for bipolar disorder.    During the hospitalization, other adjustments were made to the patient's psychiatric medication regimen:  Abilify was increased to 7.5 mg once daily  Sertraline was continued at 50 mg once daily   Gradually, patient started adjusting to milieu.   Patient's care was discussed during the interdisciplinary team meeting every day during the hospitalization.  The patient denied having side effects to prescribed psychiatric medication.  The patient reports their target psychiatric symptoms of depression, anxiety, and suicidal thoughts, all responded well to the psychiatric medications, and the  patient reports overall benefit other psychiatric hospitalization. Supportive psychotherapy was provided to the patient. The patient also participated in regular group therapy while admitted.   Labs were reviewed with the patient, and abnormal results were discussed with the patient.  The patient denied having suicidal thoughts more than 48 hours prior to discharge.  Patient denies having homicidal thoughts.  Patient denies having auditory hallucinations.  Patient denies any visual hallucinations.  Patient denies having paranoid thoughts.  The patient is able to verbalize their individual safety plan to this provider.  It is recommended to the patient to continue psychiatric medications as prescribed, after discharge from the hospital.    It is recommended to the patient to follow up with your outpatient psychiatric provider and PCP.  Discussed with the patient, the impact of alcohol, drugs, tobacco have been there overall psychiatric and medical wellbeing, and total abstinence from substance use was recommended the patient.     Musculoskeletal: Strength & Muscle Tone: within normal limits Gait & Station: normal Patient leans: N/A  Psychiatric Specialty Exam  Presentation  General Appearance: Appropriate for Environment; Casual; Fairly Groomed  Eye Contact:Good  Speech:Clear and Coherent; Normal Rate  Speech Volume:Normal  Handedness:Right   Mood and Affect  Mood:Euthymic  Duration of Depression Symptoms: Greater than two weeks  Affect:Appropriate; Congruent; Full Range   Thought Process  Thought Processes:Coherent; Linear  Descriptions of Associations:Intact  Orientation:Full (Time, Place and Person)  Thought Content:Logical  History of Schizophrenia/Schizoaffective disorder:No  Duration of Psychotic Symptoms:No data recorded Hallucinations:Hallucinations: None  Ideas of Reference:None  Suicidal Thoughts:Suicidal Thoughts: No  Homicidal  Thoughts:Homicidal Thoughts: No   Sensorium  Memory:Immediate Good; Recent Good; Remote Good  Judgment:Good  Insight:Good   Executive Functions  Concentration:Good  Attention Span:Good  [place] of Knowledge:Good  Language:Good   Psychomotor Activity  Psychomotor Activity:Psychomotor Activity: Normal   Assets  Assets:Communication Skills; Desire for Improvement; Social Support; Housing; Resilience   Sleep  Sleep:Sleep: Good Number of Hours of Sleep: 6.75   Physical Exam: Physical Exam see discharge summary  ROS see discharge summary   Blood pressure 128/85, pulse 94, temperature 98.2 F (36.8 C), temperature source Oral, resp. rate 18, height 5\' 2"  (1.575 m), weight 52 kg, SpO2 98 %. Body mass index is 20.97 kg/m.  Mental Status Per Nursing Assessment::   On Admission:  Suicidal ideation indicated by patient  Demographic factors:  Unemployed, Low socioeconomic status Loss Factors:  NA Historical Factors:  Prior suicide attempts Risk Reduction Factors:  Living with another person, especially a relative  Continued Clinical Symptoms:  Depressive symptoms resolved and suicidal thoughts resolved.   Cognitive Features That Contribute To Risk:  None    Suicide Risk:  Mild:    There are no identifiable suicide plans, no associated intent, mild dysphoria and related symptoms, good self-control (both objective and subjective assessment), few other risk factors, and identifiable protective factors, including available and accessible social support.   Follow-up Information     BEHAVIORAL HEALTH INTENSIVE CHEMICAL DEPENDENCY. Go on 05/24/2021.   Specialty: Behavioral Health Why: You have an appointment on 05/24/21 at 8:00 am for the chemical dependency/substance abuse intensive therapy services, with medication management. (You are on the wait list for a sooner appt)  If you have any questions, please call [phone]. This appointment will be held in  person. Contact information: [place] [phone] [place] [phone]                Plan Of Care/Follow-up recommendations:   Activity: as tolerated  Diet: heart healthy  Other: -Follow-up with your outpatient psychiatric provider -instructions on appointment date, time, and address (location) are provided to you in discharge paperwork.  -Take your psychiatric medications as prescribed at discharge - instructions are provided to you in the discharge paperwork  -Follow-up with outpatient primary care doctor and other specialists -for management of chronic medical disease and preventative medicine.   -Testing: Follow-up with outpatient provider for abnormal lab results:  HbA1c 6.2   -Recommend abstinence from alcohol, tobacco, and other illicit drug use at discharge.   -If your psychiatric symptoms recur, worsening, or if you have side effects to your psychiatric medications, call your outpatient psychiatric provider, 911, [phone] or go to the nearest emergency department.  -If suicidal thoughts recur, call your outpatient psychiatric provider, 911, [phone] or go to the nearest emergency department.   Christoper Allegra, MD 05/09/2021, 10:05 AM

## 2021-05-09 NOTE — Progress Notes (Addendum)
D: Pt A & O X 4. Denies SI, HI, AVH and pain at this time. D/C home as ordered. Picked up in lobby by "my friend, my room mate". A: D/C instructions reviewed with pt including prescriptions, medication samples and follow up appointments; compliance encouraged. All belongings from assigned locker returned to pt at time of departure. Scheduled and PRN medications given with verbal education and effects monitored. Safety checks maintained without incident till time of d/c.  R: Pt receptive to care. Compliant with medications when offered. Denies adverse drug reactions when assessed. Verbalized understanding related to d/c instructions. Signed belonging sheet in agreement with items received from locker. Ambulatory with a steady gait. Appears to be in no physical distress at time of departure.

## 2021-05-09 NOTE — BH IP Treatment Plan (Signed)
Interdisciplinary Treatment and Diagnostic Plan Update  05/09/2021 Time of Session: 10:35am MILLI WOOLRIDGE MRN: [id number]  Principal Diagnosis: MDD (major depressive disorder), recurrent episode, severe ([place])  Secondary Diagnoses: Principal Problem:   MDD (major depressive disorder), recurrent episode, severe ([place]) Active Problems:   Polysubstance abuse ([place])   GAD (generalized anxiety disorder)   Current Medications:  Current Facility-Administered Medications  Medication Dose Route Frequency Provider Last Rate Last Admin   acetaminophen (TYLENOL) tablet 650 mg  650 mg Oral Q6H PRN [name], FNP   650 mg at 05/08/21 1436   albuterol (VENTOLIN HFA) 108 (90 Base) MCG/ACT inhaler 1-2 puff  1-2 puff Inhalation Q6H PRN [name], FNP   2 puff at 05/09/21 0819   alum & mag hydroxide-simeth (MAALOX/MYLANTA) 200-200-20 MG/5ML suspension 30 mL  30 mL Oral Q4H PRN [name], FNP       ARIPiprazole (ABILIFY) tablet 5 mg  5 mg Oral Daily Nwoko, Herbert Pun I, NP   5 mg at 05/09/21 0817   hydrOXYzine (ATARAX/VISTARIL) tablet 25 mg  25 mg Oral TID PRN Lindell Spar I, NP   25 mg at 05/08/21 2111   magnesium hydroxide (MILK OF MAGNESIA) suspension 30 mL  30 mL Oral Daily PRN [name], FNP       metoprolol succinate (TOPROL-XL) 24 hr tablet 12.5 mg  12.5 mg Oral Daily [name], FNP   12.5 mg at 05/09/21 0817   montelukast (SINGULAIR) tablet 10 mg  10 mg Oral QHS [name], FNP   10 mg at 05/08/21 2111   nicotine (NICODERM CQ - dosed in mg/24 hours) patch 21 mg  21 mg Transdermal Q0600 [name], FNP       sertraline (ZOLOFT) tablet 50 mg  50 mg Oral Daily Lindell Spar I, NP   50 mg at 05/09/21 0817   traZODone (DESYREL) tablet 100 mg  100 mg Oral QHS PRN [name], FNP   100 mg at 05/08/21 2111   PTA Medications: Medications Prior to Admission  Medication Sig Dispense Refill Last Dose   albuterol  (VENTOLIN HFA) 108 (90 Base) MCG/ACT inhaler Inhale 1-2 puffs into the lungs every 6 (six) hours as needed for wheezing or shortness of breath. 18 g 0    aspirin 325 MG EC tablet Take 1,300 mg by mouth every 4 (four) hours as needed for pain.      benzonatate (TESSALON) 100 MG capsule Take 1 capsule (100 mg total) by mouth every 8 (eight) hours. (Patient taking differently: Take 100 mg by mouth every 8 (eight) hours as needed for cough.) 21 capsule 0    guaiFENesin (MUCINEX) 600 MG 12 hr tablet Take 1 tablet (600 mg total) by mouth 2 (two) times daily. (Patient not taking: Reported on 05/03/2021)      montelukast (SINGULAIR) 10 MG tablet Take 1 tablet (10 mg total) by mouth at bedtime. (Patient not taking: No sig reported) 60 tablet 1     Patient Stressors: Marital or family conflict   Medication change or noncompliance    Patient Strengths: Psychologist, clinical for treatment/growth   Treatment Modalities: Medication Management, Group therapy, Case management,  1 to 1 session with clinician, Psychoeducation, Recreational therapy.   Physician Treatment Plan for Primary Diagnosis: MDD (major depressive disorder), recurrent episode, severe ([place]) Long Term Goal(s): Improvement in symptoms so as ready for discharge   Short Term Goals: Ability to identify changes in lifestyle to  reduce recurrence of condition will improve Ability to verbalize feelings will improve Ability to disclose and discuss suicidal ideas Ability to demonstrate self-control will improve Ability to identify and develop effective coping behaviors will improve Ability to maintain clinical measurements within normal limits will improve Compliance with prescribed medications will improve Ability to identify triggers associated with substance abuse/mental health issues will improve  Medication Management: Evaluate patient's response, side effects, and tolerance of medication regimen.  Therapeutic  Interventions: 1 to 1 sessions, Unit Group sessions and Medication administration.  Evaluation of Outcomes: Adequate for Discharge  Physician Treatment Plan for Secondary Diagnosis: Principal Problem:   MDD (major depressive disorder), recurrent episode, severe ([place]) Active Problems:   Polysubstance abuse ([place])   GAD (generalized anxiety disorder)  Long Term Goal(s): Improvement in symptoms so as ready for discharge   Short Term Goals: Ability to identify changes in lifestyle to reduce recurrence of condition will improve Ability to verbalize feelings will improve Ability to disclose and discuss suicidal ideas Ability to demonstrate self-control will improve Ability to identify and develop effective coping behaviors will improve Ability to maintain clinical measurements within normal limits will improve Compliance with prescribed medications will improve Ability to identify triggers associated with substance abuse/mental health issues will improve     Medication Management: Evaluate patient's response, side effects, and tolerance of medication regimen.  Therapeutic Interventions: 1 to 1 sessions, Unit Group sessions and Medication administration.  Evaluation of Outcomes: Adequate for Discharge   RN Treatment Plan for Primary Diagnosis: MDD (major depressive disorder), recurrent episode, severe ([place]) Long Term Goal(s): Knowledge of disease and therapeutic regimen to maintain health will improve  Short Term Goals: Ability to remain free from injury will improve, Ability to verbalize frustration and anger appropriately will improve, Ability to demonstrate self-control, Ability to identify and develop effective coping behaviors will improve, and Compliance with prescribed medications will improve  Medication Management: RN will administer medications as ordered by provider, will assess and evaluate patient's response and provide education to patient for prescribed medication. RN will  report any adverse and/or side effects to prescribing provider.  Therapeutic Interventions: 1 on 1 counseling sessions, Psychoeducation, Medication administration, Evaluate responses to treatment, Monitor vital signs and CBGs as ordered, Perform/monitor CIWA, COWS, AIMS and Fall Risk screenings as ordered, Perform wound care treatments as ordered.  Evaluation of Outcomes: Adequate for Discharge   LCSW Treatment Plan for Primary Diagnosis: MDD (major depressive disorder), recurrent episode, severe ([place]) Long Term Goal(s): Safe transition to appropriate next level of care at discharge, Engage patient in therapeutic group addressing interpersonal concerns.  Short Term Goals: Engage patient in aftercare planning with referrals and resources, Increase social support, Increase ability to appropriately verbalize feelings, Identify triggers associated with mental health/substance abuse issues, and Increase skills for wellness and recovery  Therapeutic Interventions: Assess for all discharge needs, 1 to 1 time with Social worker, Explore available resources and support systems, Assess for adequacy in community support network, Educate family and significant other(s) on suicide prevention, Complete Psychosocial Assessment, Interpersonal group therapy.  Evaluation of Outcomes: Adequate for Discharge   Progress in Treatment: Attending groups: Yes. Participating in groups: Yes. Taking medication as prescribed: Yes. Toleration medication: Yes. Family/Significant other contact made: Yes, individual(s) contacted:  Friend  Patient understands diagnosis: Yes. Discussing patient identified problems/goals with staff: Yes. Medical problems stabilized or resolved: Yes. Denies suicidal/homicidal ideation: Yes. Issues/concerns per patient self-inventory: No.     New problem(s) identified: No, Describe:  None    New  Short Term/Long Term Goal(s): medication stabilization, elimination of SI thoughts,  development of comprehensive mental wellness plan.    Patient Goals: "To get stable on my medications"    Discharge Plan or Barriers: Patient is to return home and follow up with [facility].   Reason for Continuation of Hospitalization: Depression Medication stabilization    Estimated Length of Stay: 1 day   Scribe for Treatment Team: Vassie Moselle, LCSW 05/09/2021 11:15 AM

## 2021-05-09 NOTE — Group Note (Signed)
Recreation Therapy Group Note   Group Topic:Stress Management  Group Date: 05/09/2021 Start Time: 0930 End Time: 0945 Facilitators: Victorino Sparrow, [place] Location: [address] Dayroom   Goal Area(s) Addresses:  Patient will identify positive stress management techniques. Patient will identify benefits of using stress management post d/c.  Group Description:  Meditation.  LRT played a meditation that focused on self trust and how we sometimes doubt ourselves due to past mistakes and what it takes to regain that confidence/trust in ones self.  Patients were to listen, follow along and let their breathing relax each person as much as possible.   Affect/Mood: Appropriate   Participation Level: Engaged   Participation Quality: Independent   Behavior: Appropriate   Speech/Thought Process: Focused   Insight: Good   Judgement: Good   Modes of Intervention: Meditation   Patient Response to Interventions:  Engaged   Education Outcome:  Acknowledges education and In group clarification offered    Clinical Observations/Individualized Feedback: Pt attended and participated in group.    Plan: Continue to engage patient in RT group sessions 2-3x/week.   Victorino Sparrow, Glennis Brink 05/09/2021 12:27 PM

## 2021-05-09 NOTE — Progress Notes (Signed)
  [facility] Adult Case Management Discharge Plan :  Will you be returning to the same living situation after discharge:  Yes,  Home  At discharge, do you have transportation home?: Yes,  Friend  Do you have the ability to pay for your medications: Yes,  Medicare   Release of information consent forms completed and in the chart;  Patient's signature needed at discharge.  Patient to Follow up at:  Follow-up Information     BEHAVIORAL HEALTH INTENSIVE CHEMICAL DEPENDENCY. Go on 05/24/2021.   Specialty: Behavioral Health Why: You have an appointment on 05/24/21 at 8:00 am for the chemical dependency/substance abuse intensive therapy services, with medication management. (You are on the wait list for a sooner appt)  If you have any questions, please call [phone]. This appointment will be held in person. Contact information: [place] [phone] [place] [phone]                Next level of care provider has access to [place] and Suicide Prevention discussed: Yes,  with patient and friend      Has patient been referred to the Quitline?: Patient refused referral  Patient has been referred for addiction treatment: Yes  Darleen Crocker, [place] 05/09/2021, 11:21 AM

## 2021-05-09 NOTE — Discharge Summary (Addendum)
Physician Discharge Summary Note  Patient:  [name] is an 50 y.o., female MRN:  [id number] DOB:  1970-07-15 Patient phone:  [phone] (home)  Patient address:   [address] [place] [address],  Total Time spent with patient: 30 minutes  Date of Admission:  05/03/2021 Date of Discharge: 05/09/2021  Reason for Admission:  (From NP's admission note):This is the second psychiatric admission in this [facility] for this 50 year old AA female with hx of mental health issues & cocaine use disorders. Her previous admission in this [facility] was 10 years ago". She is admitted to the [facility] this time with complain of worsening suicidal ideations of 3 days with plan to cut her wrist. She was brought to the hospital by her friend Zollie Beckers) for evaluation & treatments. Her UDS was positive for Cocaine. During this evaluation, [name] reports,    "I got to the [facility] 2 days ago. My friend Zollie Beckers took me there because of bad depression & anxiety. I have been depressed for a long time, since I was a child. I have been on mental health medications off & on over the years. But, I have been off my mental health medications for 4 months this time. I ran out of my medicines but never went back to follow-up with my psychiatrist. I was going to the [place] [facility] at the time. I have been feeling sad & blique for a long time. That has led me to become suicidal. I have been feeling suicidal off & on x 1 week. I was going to cut on my arm, but I did not.  I'm still feeling sad. I would like to re-start my medicines but I don't remember the names of my previous medicines. I had attempted suicide three times in the past at ages, 32, 38 & 21, all by cutting on my arm/wrists. I have hx of HTN & asthma".  Principal Problem: MDD (major depressive disorder), recurrent episode, severe (HCC) Discharge Diagnoses: Principal Problem:   MDD (major depressive disorder), recurrent episode, severe (HCC) Active Problems:    Polysubstance abuse (HCC)   GAD (generalized anxiety disorder)   Past Psychiatric History: Major depressive disorder, anxiety disorder  Past Medical History:  Past Medical History:  Diagnosis Date   Acid reflux    Anxiety    Asthma    Drug abuse (HCC)    ETOH abuse    Hypertension    Manic-depressive disorder (HCC)    PTSD (post-traumatic stress disorder)     Past Surgical History:  Procedure Laterality Date   TUBAL LIGATION     Family History:  Family History  Problem Relation Age of Onset   Diabetes Mother    Hypertension Mother    Family Psychiatric  History: Denies any familial hx of mental illness Social History:  Social History   Substance and Sexual Activity  Alcohol Use Yes   Comment: in rehab     Social History   Substance and Sexual Activity  Drug Use Yes   Types: Cocaine   Comment: in rehab    Social History   Socioeconomic History   Marital status: Divorced    Spouse name: Not on file   Number of children: Not on file   Years of education: Not on file   Highest education level: Not on file  Occupational History   Not on file  Tobacco Use   Smoking status: Every Day    Packs/day: 0.50    Years: 29.00  Pack years: 14.50    Types: Cigarettes   Smokeless tobacco: Never   Tobacco comments:    in rehab  Vaping Use   Vaping Use: Never used  Substance and Sexual Activity   Alcohol use: Yes    Comment: in rehab   Drug use: Yes    Types: Cocaine    Comment: in rehab   Sexual activity: Yes    Birth control/protection: None  Other Topics Concern   Not on file  Social History Narrative   Not on file   Social Determinants of Health   Financial Resource Strain: Not on file  Food Insecurity: Not on file  Transportation Needs: Not on file  Physical Activity: Not on file  Stress: Not on file  Social Connections: Not on file    Hospital Course:  After the above admission evaluation, Antonio's presenting symptoms were noted. She was  recommended for mood stabilization treatments. The medication regimen targeting those presenting symptoms were discussed with her & initiated with her consent. She was started on Abilify for her mood and Zoloft for depression. She also had PRN medications Vistaril for anxiety and Trazodone for sleep available to her. Her UDS on arrival to the ED was positive for cocaine, BAL was negative.  She was medicated, stabilized & discharged on the medications listed on her discharge medication list below. Besides the mood stabilization treatments, [name] was also enrolled & participated in the group counseling sessions being offered & held on this unit. She learned coping skills. She presented no other significant pre-existing medical issues that required treatment. She tolerated his treatment regimen without any adverse effects or reactions reported.   During the course of her hospitalization, the 15-minute checks were adequate to ensure patient's safety. [name] did not display any dangerous, violent or suicidal behavior on the unit.  She interacted with patients & staff appropriately, participated appropriately in the group sessions/therapies. Her medications were addressed & adjusted to meet her needs. During hospitalization, on 05-06-2021, pt received a different patient's medications, and this was discussed with the pharmacy and it was determined no acute emergent intervention was necessary, and to monitor the patient closely for side effects. Pt was told about this, possible side effects that she could have, and pt voiced understanding of this.  She was recommended for outpatient follow-up care & medication management upon discharge to assure continuity of care & mood stability.  At the time of discharge patient is not reporting any acute suicidal/homicidal ideations. She feels more confident about her self-care & in managing his mental health. She currently denies any new issues or concerns. Education and  supportive counseling provided throughout her hospital stay & upon discharge.   Today upon her discharge evaluation with the attending psychiatrist, Gracielynn shares she is doing well. She denies any other specific concerns. She is sleeping well. Her appetite is good. She denies other physical complaints. She denies AH/VH, delusional thoughts or paranoia. She does not appear to be responding to any internal stimuli. She feels that her medications have been helpful & is in agreement to continue her current treatment regimen as recommended. She was able to engage in safety planning including plan to return to [facility] or contact emergency services if she feels unable to maintain her own safety or the safety of others. Pt had no further questions, comments, or concerns. She left [facility] with all personal belongings in no apparent distress. Transportation home per private vehicle with her roommate.    Physical Findings: AIMS: score  zero on 05-09-2021  CIWA:    COWS:     Musculoskeletal: Strength & Muscle Tone: within normal limits Gait & Station: normal Patient leans: N/A  Psychiatric Specialty Exam:  Presentation  General Appearance: Appropriate for Environment; Casual; Fairly Groomed  Eye Contact:Good  Speech:Clear and Coherent; Normal Rate  Speech Volume:Normal  Handedness:Right  Mood and Affect  Mood:Euthymic  Affect:Appropriate; Congruent; Full Range  Thought Process  Thought Processes:Coherent; Linear  Descriptions of Associations:Intact  Orientation:Full (Time, Place and Person)  Thought Content:Logical  History of Schizophrenia/Schizoaffective disorder:No  Duration of Psychotic Symptoms:No data recorded Hallucinations:Hallucinations: None  Ideas of Reference:None  Suicidal Thoughts:Suicidal Thoughts: No  Homicidal Thoughts:Homicidal Thoughts: No  Sensorium  Memory:Immediate Good; Recent Good; Remote Good  Judgment:Good  Insight:Good  Executive Functions   Concentration:Good  Attention Span:Good  Recall:Good  Fund of Knowledge:Good  Language:Good  Psychomotor Activity  Psychomotor Activity:Psychomotor Activity: Normal  Assets  Assets:Communication Skills; Desire for Improvement; Social Support; Housing; Resilience  Sleep  Sleep:Sleep: Good Number of Hours of Sleep: 6.75  Physical Exam: Physical Exam Vitals and nursing note reviewed.  Constitutional:      Appearance: Normal appearance.  HENT:     Head: Normocephalic and atraumatic.  Pulmonary:     Effort: Pulmonary effort is normal.  Musculoskeletal:        General: Normal range of motion.     Cervical back: Normal range of motion.  Neurological:     General: No focal deficit present.     Mental Status: She is alert and oriented to person, place, and time.   Review of Systems  Constitutional:  Negative for fever.  HENT: Negative.    Respiratory: Negative.    Cardiovascular: Negative.   Gastrointestinal: Negative.   Genitourinary: Negative.   Musculoskeletal: Negative.   Neurological: Negative.    Blood pressure 128/85, pulse 94, temperature 98.2 F (36.8 C), temperature source Oral, resp. rate 18, height 5\' 2"  (1.575 m), weight 52 kg, SpO2 98 %. Body mass index is 20.97 kg/m.   Social History   Tobacco Use  Smoking Status Every Day   Packs/day: 0.50   Years: 29.00   Pack years: 14.50   Types: Cigarettes  Smokeless Tobacco Never  Tobacco Comments   in rehab   Tobacco Cessation:  A prescription for an FDA-approved tobacco cessation medication provided at discharge   Blood Alcohol level:  Lab Results  Component Value Date   [facility] <10 05/02/2021   ETH <11 10/19/2013    Metabolic Disorder Labs:  Lab Results  Component Value Date   HGBA1C 6.2 (H) 05/04/2021   MPG 131.24 05/04/2021   MPG 129 09/02/2007   No results found for: PROLACTIN Lab Results  Component Value Date   CHOL 189 05/04/2021   TRIG 123 05/04/2021   HDL 67 05/04/2021   CHOLHDL  2.8 05/04/2021   VLDL 25 05/04/2021   LDLCALC 97 05/04/2021   LDLCALC  09/03/2007    77        Total Cholesterol/HDL:CHD Risk Coronary Heart Disease Risk Table                     Men   Women  1/2 Average Risk   3.4   3.3    See Psychiatric Specialty Exam and Suicide Risk Assessment completed by Attending Physician prior to discharge.  Discharge destination:  Home  Is patient on multiple antipsychotic therapies at discharge:  No   Has Patient had three or more failed trials of  antipsychotic monotherapy by history:  No  Recommended Plan for Multiple Antipsychotic Therapies: NA  Discharge Instructions     Diet - low sodium heart healthy   Complete by: As directed    Increase activity slowly   Complete by: As directed       Allergies as of 05/09/2021       Reactions   Benadryl [diphenhydramine]    "heart races"   Latex Rash        Medication List     STOP taking these medications    aspirin 325 MG EC tablet   benzonatate 100 MG capsule Commonly known as: TESSALON   guaiFENesin 600 MG 12 hr tablet Commonly known as: Mucinex   montelukast 10 MG tablet Commonly known as: Singulair       TAKE these medications      Indication  albuterol 108 (90 Base) MCG/ACT inhaler Commonly known as: VENTOLIN HFA Inhale 1-2 puffs into the lungs every 6 (six) hours as needed for wheezing or shortness of breath.  Indication: Asthma   ARIPiprazole 5 MG tablet Commonly known as: ABILIFY Take 1 tablet (5 mg total) by mouth daily. Start taking on: May 10, 2021  Indication: Major Depressive Disorder, Adjunct to antidepressant.   hydrOXYzine 25 MG tablet Commonly known as: ATARAX/VISTARIL Take 1 tablet (25 mg total) by mouth 3 (three) times daily as needed for anxiety (vistaril 25 mg p.o. for anxiety tid prn).  Indication: Feeling Anxious   metoprolol succinate 25 MG 24 hr tablet Commonly known as: TOPROL-XL Take 0.5 tablets (12.5 mg total) by mouth daily. Start  taking on: May 10, 2021  Indication: High Blood Pressure Disorder   nicotine 21 mg/24hr patch Commonly known as: NICODERM CQ - dosed in mg/24 hours Place 1 patch (21 mg total) onto the skin daily at 6 (six) AM. Start taking on: May 10, 2021  Indication: Nicotine Addiction   sertraline 50 MG tablet Commonly known as: ZOLOFT Take 1 tablet (50 mg total) by mouth daily. Start taking on: May 10, 2021  Indication: Generalized Anxiety Disorder, Major Depressive Disorder   traZODone 100 MG tablet Commonly known as: DESYREL Take 1 tablet (100 mg total) by mouth at bedtime as needed for sleep.  Indication: Trouble Sleeping        Follow-up Information     BEHAVIORAL HEALTH INTENSIVE CHEMICAL DEPENDENCY. Go on 05/24/2021.   Specialty: Behavioral Health Why: You have an appointment on 05/24/21 at 8:00 am for the chemical dependency/substance abuse intensive therapy services, with medication management. (You are on the wait list for a sooner appt)  If you have any questions, please call [phone]. This appointment will be held in person. Contact information: [address] [id number] mc [place] [address] [phone]                Follow-up recommendations:  Activity:  as tolerated Diet:  Heart healthy  Comments:  Prescriptions were given at discharge.  Patient is agreeable with the discharge plan.  She was given opportunity to ask questions.  She appears to feel comfortable with discharge and denies any current suicidal or homicidal thoughts. Patient is instructed prior to discharge to: Take all medications as prescribed by her mental healthcare provider. Report any adverse effects and or reactions from the medicines to her outpatient provider promptly. Patient has been instructed & cautioned: To not engage in alcohol and or illegal drug use while on prescription medicines. In the event of worsening symptoms, patient is  instructed to  call the crisis hotline, 911 and or go to the nearest ED for appropriate evaluation and treatment of symptoms. To follow-up with her primary care provider for your other medical issues, concerns and or health care needs.    Signed: Laveda Abbe, NP 05/09/2021, 2:56 PM   Total Time Spent in Direct Patient Care:  I personally spent 45 minutes on the unit in direct patient care. The direct patient care time included face-to-face time with the patient, reviewing the patient's chart, communicating with other professionals, and coordinating care. Greater than 50% of this time was spent in counseling or coordinating care with the patient regarding goals of hospitalization, psycho-education, and discharge planning needs.  On my assessment the patient denied SI, HI, AVH, paranoia, ideas of reference, or first rank symptoms on day of discharge. Patient denied drug cravings or active signs of withdrawal. Patient denied medication side-effects. Patient was not deemed to be a danger to self or others on day of discharge and was in agreement with discharge plans.   I have independently evaluated the patient during a face-to-face assessment on 05/09/21. I reviewed the patient's chart, and I participated in key portions of the service. I discussed the case with the APP, and I agree with the assessment and plan of care as documented in the APP's note, as addended by me or notated below:  I directly edited the note, as above.   Phineas Inches, MD Psychiatrist

## 2021-05-09 NOTE — BHH Group Notes (Signed)
Pt attended goals group and contributed 

## 2021-05-11 NOTE — Telephone Encounter (Signed)
Patient should see outpatient provider for 90 day supply. Inpatient prescriptions are written for 30 days only

## 2021-05-15 NOTE — BHH Group Notes (Signed)
Spiritual care group on grief and loss facilitated by chaplain Janne Napoleon, [facility]   Group Goal:   Support / Education around grief and loss   Members engage in facilitated group support and psycho-social education.   Group Description:   Following introductions and group rules, group members engaged in facilitated group dialog and support around topic of loss, with particular support around experiences of loss in their lives. Group Identified types of loss (relationships / self / things) and identified patterns, circumstances, and changes that precipitate losses. Reflected on thoughts / feelings around loss, normalized grief responses, and recognized variety in grief experience. Group noted Worden's four tasks of grief in discussion.   Group drew on Adlerian / Rogerian, narrative, MI,   Patient Progress: [place] attended group and participated and engaged in the conversation.  [place], Bcc Pager, [phone] 4:11 PM

## 2021-05-24 ENCOUNTER — Ambulatory Visit (HOSPITAL_COMMUNITY): Payer: Medicare Other | Admitting: Licensed Clinical Social Worker

## 2021-06-02 ENCOUNTER — Ambulatory Visit (INDEPENDENT_AMBULATORY_CARE_PROVIDER_SITE_OTHER): Payer: Medicare Other | Admitting: Licensed Clinical Social Worker

## 2021-06-02 ENCOUNTER — Other Ambulatory Visit: Payer: Self-pay

## 2021-06-02 DIAGNOSIS — F1994 Other psychoactive substance use, unspecified with psychoactive substance-induced mood disorder: Secondary | ICD-10-CM

## 2021-06-02 DIAGNOSIS — F1910 Other psychoactive substance abuse, uncomplicated: Secondary | ICD-10-CM

## 2021-06-02 NOTE — Progress Notes (Signed)
Comprehensive Clinical Assessment (CCA) Screening, Triage and Referral Note  06/02/2021 [name] [id number]  Chief Complaint:  Chief Complaint  Patient presents with   Addiction Problem   Depression   Anxiety   Post-Traumatic Stress Disorder   Visit Diagnosis: Unable to diagnosis as assessment was not completed. Per client report "anxiety, depression, bipolar, ptsd" per chart appears poly substance use and unknown MH d/o d/t substance use.  Patient Reported Information How did you hear about [place]? Hospital Discharge  What Is the Reason for Your Visit/Call Today? medication, therapy Client reported depression, racing thoughts, anxiety, ptsd, and substance use ongoing. Client referred to [place] following [facility]  How Long Has This Been Causing You Problems? > than 6 months  What Do You Feel Would Help You the Most Today? Alcohol or Drug Use Treatment; Treatment for Depression or other mood problem   Have You Recently Had Any Thoughts About Hurting Yourself? No  Are You Planning to Commit Suicide/Harm Yourself At This time? No   Have you Recently Had Thoughts About [place]? No  Are You Planning to Harm Someone at This Time? No  Explanation: No data recorded  Have You Used Any Alcohol or Drugs in the Past 24 Hours? No (48 hours)  How Long Ago Did You Use Drugs or Alcohol? No data recorded What Did You Use and How Much? Crack cocaine day before yesterday; recent alcohol and THC  Do You Currently Have a Therapist/Psychiatrist? No  Name of Therapist/Psychiatrist: No data recorded  Have You Been Recently Discharged From Any Office Practice or Programs? No  Explanation of Discharge From Practice/Program: No data recorded   CCA Screening Triage Referral Assessment Type of Contact: Face-to-Face  Telemedicine Service Delivery:   Is this Initial or Reassessment? Re-assessment for outpatient services Date Telepsych consult ordered in [facility]:   05/03/21  Time Telepsych consult ordered in [facility]:  No data recorded Location of Assessment: Other (comment) ([facility])  Provider Location: Other (comment) ([facility])   Collateral Involvement: EHR   Does Patient Have a [place]? No data recorded Name and Contact of Legal Guardian: No data recorded If Minor and Not Living with Parent(s), Who has Custody? n/a  Is CPS involved or ever been involved? Never  Is APS involved or ever been involved? Never   Patient Determined To Be At Risk for Harm To Self or Others Based on Review of Patient Reported Information or Presenting Complaint? No  Method: No data recorded Availability of Means: No data recorded Intent: No data recorded Notification Required: No data recorded Additional Information for Danger to Others Potential: No data recorded Additional Comments for Danger to Others Potential: No data recorded Are There Guns or Other Weapons in Your Home? No data recorded Types of Guns/Weapons: No data recorded Are These Weapons Safely Secured?                            No data recorded Who Could Verify You Are Able To Have These Secured: No data recorded Do You Have any Outstanding Charges, Pending Court Dates, Parole/Probation? No data recorded Contacted To Inform of Risk of Harm To Self or Others: Family/Significant Other:   Does Patient Present under Involuntary Commitment? No  IVC Papers Initial File Date: No data recorded  [place] of Residence: [place]   Patient Currently Receiving the Following Services: Not Receiving Services   Determination of Need: Urgent (  48 hours)   Options For Referral: Chemical Dependency Intensive Outpatient Therapy (CDIOP); Medication Management   Discharge Disposition: At the time of screening client would not be appropriate for outpatient or CDIOP due to active use of crack cocaine 4-7 days weekly with longest sobriety while inpatient and 1 week following before  relapse.   Client recommended to a higher level of care which was not possible to attend this day per client who was only interested in outpatient services for medication and therapy.  Client referred to [place] [facility] [place] who is able to accept both [facility] Medicare and medicaid. [facility] was also provided as an option.      Dawn Messier, LCSW

## 2021-06-02 NOTE — Progress Notes (Deleted)
Crack cocaine/every other day $40/ hospital last time sober; 1 week;

## 2021-06-03 ENCOUNTER — Other Ambulatory Visit (HOSPITAL_COMMUNITY): Payer: Self-pay | Admitting: Psychiatry

## 2021-06-04 NOTE — Telephone Encounter (Signed)
Patient has been instructed to follow up with their outpatient provider for additional medication refills.

## 2021-06-26 DIAGNOSIS — A539 Syphilis, unspecified: Secondary | ICD-10-CM

## 2021-06-26 HISTORY — DX: Syphilis, unspecified: A53.9

## 2021-07-14 ENCOUNTER — Other Ambulatory Visit: Payer: Self-pay

## 2021-07-14 ENCOUNTER — Encounter (HOSPITAL_COMMUNITY): Payer: Self-pay

## 2021-07-14 ENCOUNTER — Ambulatory Visit (HOSPITAL_COMMUNITY)
Admission: EM | Admit: 2021-07-14 | Discharge: 2021-07-14 | Disposition: A | Discharge status: Home / Self Care | Payer: Medicare Other | Attending: Emergency Medicine | Admitting: Emergency Medicine

## 2021-07-14 DIAGNOSIS — K0889 Other specified disorders of teeth and supporting structures: Secondary | ICD-10-CM | POA: Diagnosis not present

## 2021-07-14 DIAGNOSIS — K029 Dental caries, unspecified: Secondary | ICD-10-CM | POA: Diagnosis not present

## 2021-07-14 DIAGNOSIS — J45909 Unspecified asthma, uncomplicated: Secondary | ICD-10-CM | POA: Diagnosis not present

## 2021-07-14 MED ORDER — PENICILLIN V POTASSIUM 500 MG PO TABS
500.0000 mg | ORAL_TABLET | Freq: Four times a day (QID) | ORAL | 0 refills | Status: AC
Start: 2021-07-14 — End: 2021-07-24

## 2021-07-14 MED ORDER — ALBUTEROL SULFATE HFA 108 (90 BASE) MCG/ACT IN AERS
INHALATION_SPRAY | RESPIRATORY_TRACT | Status: AC
  Filled 2021-07-14: qty 6.7

## 2021-07-14 MED ORDER — NAPROXEN 375 MG PO TABS: 375.0000 mg | ORAL_TABLET | Freq: Two times a day (BID) | ORAL | 0 refills | Status: DC

## 2021-07-14 MED ORDER — ALBUTEROL SULFATE HFA 108 (90 BASE) MCG/ACT IN AERS
2.0000 | INHALATION_SPRAY | Freq: Once | RESPIRATORY_TRACT | Status: AC
  Administered 2021-07-14: 2 via RESPIRATORY_TRACT

## 2021-07-14 NOTE — Discharge Instructions (Signed)
Use the albuterol inhaler 2 puffs every 4-6 hours as needed.  Contact Medicaid for help finding a primary care provider and dental clinic

## 2021-07-14 NOTE — ED Triage Notes (Signed)
Pt c/o toothache to lt upper and bottom teeth since Monday. Taking advil with no relief. Pt requesting resources for a dentist.

## 2021-07-15 NOTE — ED Provider Notes (Signed)
[name]    CSN: [id number] Arrival date & time: 07/14/21  1853      History   Chief Complaint Chief Complaint  Patient presents with   Dental Pain    HPI [name] is a 51 y.o. female. Pt reports dental caries/decay and no dental care. Pt c/o toothache to lt upper and bottom teeth since 07/11/21. Also reports facial swelling near areas of pain. Taking advil with no relief. Pt requesting resources for a dentist.   Is a smoker, has asthma. Is out of albuterol, requesting an inhaler here. C/o wheezing and cough.    Dental Pain Associated symptoms: no congestion and no fever    Past Medical History:  Diagnosis Date   Acid reflux    Anxiety    Asthma    Drug abuse ([place])    ETOH abuse    Hypertension    Manic-depressive disorder ([place])    PTSD (post-traumatic stress disorder)     Patient Active Problem List   Diagnosis Date Noted   GAD (generalized anxiety disorder) 05/09/2021   MDD (major depressive disorder), recurrent episode, severe ([place]) 05/03/2021   Bipolar 1 disorder ([place]) 10/20/2013   Suicidal ideations 10/20/2013   Polysubstance abuse ([place]) 10/20/2013    Past Surgical History:  Procedure Laterality Date   TUBAL LIGATION      OB History   No obstetric history on file.      Home Medications    Prior to Admission medications   Medication Sig Start Date End Date Taking? Authorizing Provider  naproxen (NAPROSYN) 375 MG tablet Take 1 tablet (375 mg total) by mouth 2 (two) times daily. 07/14/21  Yes Carvel Getting, NP  penicillin v potassium (VEETID) 500 MG tablet Take 1 tablet (500 mg total) by mouth 4 (four) times daily for 10 days. 07/14/21 07/24/21 Yes Carvel Getting, NP  albuterol (VENTOLIN HFA) 108 (90 Base) MCG/ACT inhaler Inhale 1-2 puffs into the lungs every 6 (six) hours as needed for wheezing or shortness of breath. 04/11/21   Lamptey, Myrene Galas, MD  ARIPiprazole (ABILIFY) 5 MG tablet Take 1 tablet (5 mg total) by mouth daily.  05/10/21   Ethelene Hal, NP  hydrOXYzine (ATARAX/VISTARIL) 25 MG tablet Take 1 tablet (25 mg total) by mouth 3 (three) times daily as needed for anxiety (vistaril 25 mg p.o. for anxiety tid prn). 05/09/21   Ethelene Hal, NP  metoprolol succinate (TOPROL-XL) 25 MG 24 hr tablet Take 0.5 tablets (12.5 mg total) by mouth daily. 05/10/21   Ethelene Hal, NP  nicotine (NICODERM CQ - DOSED IN MG/24 HOURS) 21 mg/24hr patch Place 1 patch (21 mg total) onto the skin daily at 6 (six) AM. 05/10/21   Ethelene Hal, NP  sertraline (ZOLOFT) 50 MG tablet Take 1 tablet (50 mg total) by mouth daily. 05/10/21   Ethelene Hal, NP  traZODone (DESYREL) 100 MG tablet Take 1 tablet (100 mg total) by mouth at bedtime as needed for sleep. 05/09/21   Ethelene Hal, NP  clonazePAM (KLONOPIN) 0.5 MG tablet Take 0.5 mg by mouth 3 (three) times daily as needed for anxiety.  04/06/20  [provider]  pantoprazole (PROTONIX) 40 MG tablet Take 40 mg by mouth daily.  10/23/13 04/06/20  [provider]    Family History Family History  Problem Relation Age of Onset   Diabetes Mother    Hypertension Mother     Social History Social History   Tobacco  Use   Smoking status: Every Day    Packs/day: 0.50    Years: 29.00    Pack years: 14.50    Types: Cigarettes   Smokeless tobacco: Never   Tobacco comments:    in rehab  Vaping Use   Vaping Use: Never used  Substance Use Topics   Alcohol use: Yes    Comment: in rehab   Drug use: Yes    Types: Cocaine    Comment: in rehab     Allergies   Benadryl [diphenhydramine] and Latex   Review of Systems Review of Systems  Constitutional:  Negative for chills and fever.  HENT:  Positive for dental problem. Negative for congestion, postnasal drip and sore throat.   Respiratory:  Positive for cough and wheezing. Negative for shortness of breath.     Physical Exam Triage Vital Signs ED Triage Vitals  [07/14/21 1908]  Enc Vitals Group     BP (!) 176/99     Pulse Rate 77     Resp 18     Temp 98.3 F (36.8 C)     Temp Source Oral     SpO2 97 %     Weight      Height      Head Circumference      Peak Flow      Pain Score 9     Pain Loc      Pain Edu?      Excl. in [place]?    No data found.  Updated Vital Signs BP (!) 176/99 (BP Location: Left Arm)    Pulse 77    Temp 98.3 F (36.8 C) (Oral)    Resp 18    LMP 07/10/2021    SpO2 97%   Visual Acuity Right Eye Distance:   Left Eye Distance:   Bilateral Distance:    Right Eye Near:   Left Eye Near:    Bilateral Near:     Physical Exam Constitutional:      Appearance: Normal appearance. She is not ill-appearing.  HENT:     Mouth/Throat:     Comments: Pt is missing some teeth with old broken roots still in gum, remaining teeth in poor repair. Pt with mild facial swelling near molars of L side Cardiovascular:     Rate and Rhythm: Normal rate and regular rhythm.  Pulmonary:     Effort: No respiratory distress.     Breath sounds: Examination of the right-upper field reveals wheezing. Examination of the left-upper field reveals wheezing. Examination of the right-middle field reveals wheezing. Examination of the left-middle field reveals wheezing. Examination of the right-lower field reveals wheezing. Examination of the left-lower field reveals wheezing. Wheezing present.     Comments: Mild diffuse wheezing Lymphadenopathy:     Head:     Right side of head: No submandibular adenopathy.     Left side of head: Submandibular adenopathy present.  Neurological:     Mental Status: She is alert.     UC Treatments / Results  Labs (all labs ordered are listed, but only abnormal results are displayed) Labs Reviewed - No data to display  EKG   Radiology No results found.  Procedures Procedures (including critical care time)  Medications Ordered in UC Medications  albuterol (VENTOLIN HFA) 108 (90 Base) MCG/ACT inhaler 2 puff  (2 puffs Inhalation Given 07/14/21 1939)    Initial Impression / Assessment and Plan / UC Course  I have reviewed the triage vital signs and the nursing  notes.  Pertinent labs & imaging results that were available during my care of the patient were reviewed by me and considered in my medical decision making (see chart for details).    Given albuterol inhaler. Discussed with pt to contact medicaid for help finding dentist and pcp. Discussed smoking cessation.   Final Clinical Impressions(s) / UC Diagnoses   Final diagnoses:  Dental caries  Pain, dental  Asthma, unspecified asthma severity, unspecified whether complicated, unspecified whether persistent     Discharge Instructions      Use the albuterol inhaler 2 puffs every 4-6 hours as needed.  Contact Medicaid for help finding a primary care provider and dental clinic   ED Prescriptions     Medication Sig Dispense Auth. Provider   penicillin v potassium (VEETID) 500 MG tablet Take 1 tablet (500 mg total) by mouth 4 (four) times daily for 10 days. 40 tablet Carvel Getting, NP   naproxen (NAPROSYN) 375 MG tablet Take 1 tablet (375 mg total) by mouth 2 (two) times daily. 20 tablet Carvel Getting, NP      PDMP not reviewed this encounter.   Carvel Getting, NP 07/15/21 [phone]

## 2021-07-20 ENCOUNTER — Telehealth (HOSPITAL_COMMUNITY): Payer: Self-pay | Admitting: Emergency Medicine

## 2021-07-20 NOTE — Telephone Encounter (Signed)
Received voicemail from patient requesting change from Penicillin to amoxicillin due to upset stomach/emesis when taking the Penicillin.  Reviewed with Levada Dy, APP who states patient should be close to finishing abx, see how she is doing and make sure she is working on dental f/u.  It's possible she does not need to complete antibiotics.    Attempted to reach patient x 1 to review, LVM

## 2021-08-01 ENCOUNTER — Encounter (HOSPITAL_COMMUNITY): Payer: Self-pay | Admitting: Family

## 2021-08-01 ENCOUNTER — Encounter (HOSPITAL_COMMUNITY): Payer: Self-pay

## 2021-08-01 ENCOUNTER — Inpatient Hospital Stay (HOSPITAL_COMMUNITY): Admission: AD | Admit: 2021-08-01 | Payer: Medicare Other | Source: Intra-hospital | Admitting: Psychiatry

## 2021-08-01 ENCOUNTER — Inpatient Hospital Stay (HOSPITAL_COMMUNITY)
Admission: AD | Admit: 2021-08-01 | Discharge: 2021-08-07 | DRG: 885 | Disposition: A | Discharge status: Home / Self Care | Payer: Medicare Other | Source: Intra-hospital | Attending: Psychiatry | Admitting: Psychiatry

## 2021-08-01 ENCOUNTER — Emergency Department (HOSPITAL_COMMUNITY)
Admission: EM | Admit: 2021-08-01 | Discharge: 2021-08-01 | Disposition: A | Discharge status: Other Acute Inpatient Hospital | Payer: Medicare Other | Source: Home / Self Care | Attending: Emergency Medicine | Admitting: Emergency Medicine

## 2021-08-01 ENCOUNTER — Other Ambulatory Visit: Payer: Self-pay

## 2021-08-01 DIAGNOSIS — F332 Major depressive disorder, recurrent severe without psychotic features: Secondary | ICD-10-CM | POA: Diagnosis present

## 2021-08-01 DIAGNOSIS — K047 Periapical abscess without sinus: Secondary | ICD-10-CM | POA: Diagnosis present

## 2021-08-01 DIAGNOSIS — F411 Generalized anxiety disorder: Secondary | ICD-10-CM | POA: Diagnosis present

## 2021-08-01 DIAGNOSIS — T43226A Underdosing of selective serotonin reuptake inhibitors, initial encounter: Secondary | ICD-10-CM | POA: Diagnosis present

## 2021-08-01 DIAGNOSIS — Z638 Other specified problems related to primary support group: Secondary | ICD-10-CM | POA: Diagnosis not present

## 2021-08-01 DIAGNOSIS — I10 Essential (primary) hypertension: Secondary | ICD-10-CM | POA: Diagnosis present

## 2021-08-01 DIAGNOSIS — K0889 Other specified disorders of teeth and supporting structures: Secondary | ICD-10-CM | POA: Insufficient documentation

## 2021-08-01 DIAGNOSIS — Z91128 Patient's intentional underdosing of medication regimen for other reason: Secondary | ICD-10-CM

## 2021-08-01 DIAGNOSIS — F4310 Post-traumatic stress disorder, unspecified: Secondary | ICD-10-CM | POA: Diagnosis present

## 2021-08-01 DIAGNOSIS — B3731 Acute candidiasis of vulva and vagina: Secondary | ICD-10-CM | POA: Diagnosis present

## 2021-08-01 DIAGNOSIS — F17210 Nicotine dependence, cigarettes, uncomplicated: Secondary | ICD-10-CM | POA: Diagnosis present

## 2021-08-01 DIAGNOSIS — T43596A Underdosing of other antipsychotics and neuroleptics, initial encounter: Secondary | ICD-10-CM | POA: Diagnosis present

## 2021-08-01 DIAGNOSIS — Z91040 Latex allergy status: Secondary | ICD-10-CM | POA: Insufficient documentation

## 2021-08-01 DIAGNOSIS — F1220 Cannabis dependence, uncomplicated: Secondary | ICD-10-CM | POA: Diagnosis present

## 2021-08-01 DIAGNOSIS — F1420 Cocaine dependence, uncomplicated: Secondary | ICD-10-CM | POA: Diagnosis present

## 2021-08-01 DIAGNOSIS — Z833 Family history of diabetes mellitus: Secondary | ICD-10-CM | POA: Diagnosis not present

## 2021-08-01 DIAGNOSIS — F322 Major depressive disorder, single episode, severe without psychotic features: Secondary | ICD-10-CM | POA: Insufficient documentation

## 2021-08-01 DIAGNOSIS — Z5900 Homelessness unspecified: Secondary | ICD-10-CM | POA: Diagnosis not present

## 2021-08-01 DIAGNOSIS — F1410 Cocaine abuse, uncomplicated: Secondary | ICD-10-CM | POA: Insufficient documentation

## 2021-08-01 DIAGNOSIS — Z8249 Family history of ischemic heart disease and other diseases of the circulatory system: Secondary | ICD-10-CM

## 2021-08-01 DIAGNOSIS — Z20822 Contact with and (suspected) exposure to covid-19: Secondary | ICD-10-CM | POA: Diagnosis present

## 2021-08-01 DIAGNOSIS — Z79899 Other long term (current) drug therapy: Secondary | ICD-10-CM | POA: Insufficient documentation

## 2021-08-01 DIAGNOSIS — Y900 Blood alcohol level of less than 20 mg/100 ml: Secondary | ICD-10-CM | POA: Insufficient documentation

## 2021-08-01 DIAGNOSIS — F1020 Alcohol dependence, uncomplicated: Secondary | ICD-10-CM | POA: Diagnosis present

## 2021-08-01 DIAGNOSIS — F17200 Nicotine dependence, unspecified, uncomplicated: Secondary | ICD-10-CM | POA: Diagnosis present

## 2021-08-01 DIAGNOSIS — H9193 Unspecified hearing loss, bilateral: Secondary | ICD-10-CM | POA: Diagnosis present

## 2021-08-01 DIAGNOSIS — F1910 Other psychoactive substance abuse, uncomplicated: Secondary | ICD-10-CM | POA: Diagnosis present

## 2021-08-01 DIAGNOSIS — R45851 Suicidal ideations: Secondary | ICD-10-CM | POA: Insufficient documentation

## 2021-08-01 DIAGNOSIS — Z8619 Personal history of other infectious and parasitic diseases: Secondary | ICD-10-CM | POA: Diagnosis present

## 2021-08-01 LAB — CBC
HCT: 44.2 % (ref 36.0–46.0)
Hemoglobin: 14.2 g/dL (ref 12.0–15.0)
MCH: 29.9 pg (ref 26.0–34.0)
MCHC: 32.1 g/dL (ref 30.0–36.0)
MCV: 93.1 fL (ref 80.0–100.0)
Platelets: 314 10*3/uL (ref 150–400)
RBC: 4.75 MIL/uL (ref 3.87–5.11)
RDW: 15.2 % (ref 11.5–15.5)
WBC: 7.7 10*3/uL (ref 4.0–10.5)
nRBC: 0 % (ref 0.0–0.2)

## 2021-08-01 LAB — COMPREHENSIVE METABOLIC PANEL
ALT: 13 U/L (ref 0–44)
AST: 20 U/L (ref 15–41)
Albumin: 3.9 g/dL (ref 3.5–5.0)
Alkaline Phosphatase: 80 U/L (ref 38–126)
Anion gap: 8 (ref 5–15)
BUN: 19 mg/dL (ref 6–20)
CO2: 25 mmol/L (ref 22–32)
Calcium: 9.2 mg/dL (ref 8.9–10.3)
Chloride: 105 mmol/L (ref 98–111)
Creatinine, Ser: 0.54 mg/dL (ref 0.44–1.00)
GFR, Estimated: >60 mL/min (ref >60)
Glucose, Bld: 74 mg/dL (ref 70–99)
Potassium: 3.7 mmol/L (ref 3.5–5.1)
Sodium: 138 mmol/L (ref 135–145)
Total Bilirubin: 0.3 mg/dL (ref 0.3–1.2)
Total Protein: 8.1 g/dL (ref 6.5–8.1)

## 2021-08-01 LAB — TROPONIN I (HIGH SENSITIVITY)
Troponin I (High Sensitivity): 14 ng/L (ref <18)
Troponin I (High Sensitivity): 11 ng/L (ref <18)

## 2021-08-01 LAB — ETHANOL: Alcohol, Ethyl (B): <10 mg/dL (ref <10)

## 2021-08-01 LAB — RAPID URINE DRUG SCREEN, HOSP PERFORMED
Amphetamines: NOT DETECTED
Barbiturates: NOT DETECTED
Benzodiazepines: NOT DETECTED
Cocaine: POSITIVE — AB
Opiates: NOT DETECTED
Tetrahydrocannabinol: NOT DETECTED

## 2021-08-01 LAB — RESP PANEL BY RT-PCR (FLU A&B, COVID) ARPGX2
Influenza A by PCR: NEGATIVE
Influenza B by PCR: NEGATIVE
SARS Coronavirus 2 by RT PCR: NEGATIVE

## 2021-08-01 LAB — SALICYLATE LEVEL: Salicylate Lvl: <7 mg/dL — ABNORMAL LOW (ref 7.0–30.0)

## 2021-08-01 LAB — ACETAMINOPHEN LEVEL: Acetaminophen (Tylenol), Serum: <10 ug/mL — ABNORMAL LOW (ref 10–30)

## 2021-08-01 MED ORDER — METOPROLOL SUCCINATE ER 25 MG PO TB24: 12.5000 mg | ORAL_TABLET | Freq: Every day | ORAL | 0 refills | Status: DC

## 2021-08-01 MED ORDER — METOPROLOL TARTRATE 25 MG PO TABS
12.5000 mg | ORAL_TABLET | Freq: Once | ORAL | Status: AC
  Administered 2021-08-01: 12.5 mg via ORAL
  Filled 2021-08-01: qty 1

## 2021-08-01 MED ORDER — HYDROXYZINE HCL 25 MG PO TABS
25.0000 mg | ORAL_TABLET | Freq: Three times a day (TID) | ORAL | Status: DC | PRN
  Administered 2021-08-01: 25 mg via ORAL
  Filled 2021-08-01: qty 1

## 2021-08-01 MED ORDER — MAGNESIUM HYDROXIDE 400 MG/5ML PO SUSP: 30.0000 mL | Freq: Every day | ORAL | Status: DC | PRN

## 2021-08-01 MED ORDER — ALUM & MAG HYDROXIDE-SIMETH 200-200-20 MG/5ML PO SUSP: 30.0000 mL | ORAL | Status: DC | PRN

## 2021-08-01 MED ORDER — CLINDAMYCIN HCL 150 MG PO CAPS: 300.0000 mg | ORAL_CAPSULE | Freq: Three times a day (TID) | ORAL | 0 refills | Status: DC

## 2021-08-01 MED ORDER — TRAZODONE HCL 100 MG PO TABS
100.0000 mg | ORAL_TABLET | Freq: Every evening | ORAL | Status: DC | PRN
  Administered 2021-08-01 – 2021-08-06 (×4): 100 mg via ORAL
  Filled 2021-08-01 (×6): qty 1

## 2021-08-01 MED ORDER — ARIPIPRAZOLE 5 MG PO TABS
5.0000 mg | ORAL_TABLET | Freq: Every day | ORAL | Status: DC
  Administered 2021-08-01 – 2021-08-04 (×4): 5 mg via ORAL
  Filled 2021-08-01 (×6): qty 1

## 2021-08-01 MED ORDER — METOPROLOL TARTRATE 12.5 MG HALF TABLET
12.5000 mg | ORAL_TABLET | Freq: Once | ORAL | Status: AC
  Administered 2021-08-01: 12.5 mg via ORAL
  Filled 2021-08-01: qty 1

## 2021-08-01 MED ORDER — ALBUTEROL SULFATE HFA 108 (90 BASE) MCG/ACT IN AERS
1.0000 | INHALATION_SPRAY | Freq: Four times a day (QID) | RESPIRATORY_TRACT | Status: DC | PRN
  Administered 2021-08-02: 1 via RESPIRATORY_TRACT
  Administered 2021-08-06 – 2021-08-07 (×3): 2 via RESPIRATORY_TRACT
  Filled 2021-08-01: qty 6.7

## 2021-08-01 MED ORDER — ACETAMINOPHEN 325 MG PO TABS
650.0000 mg | ORAL_TABLET | Freq: Four times a day (QID) | ORAL | Status: DC | PRN
  Administered 2021-08-01 – 2021-08-06 (×7): 650 mg via ORAL
  Filled 2021-08-01 (×8): qty 2

## 2021-08-01 MED ORDER — SERTRALINE HCL 50 MG PO TABS
50.0000 mg | ORAL_TABLET | Freq: Every day | ORAL | Status: DC
  Administered 2021-08-01 – 2021-08-03 (×3): 50 mg via ORAL
  Filled 2021-08-01 (×5): qty 1

## 2021-08-01 NOTE — ED Triage Notes (Addendum)
Patient reports that she is suicidal and her plan is to "cut her wrist and take some pills." Patient denies HI, visual or auditory hallucinations.   Patient states she used crack 2 days ago.  Patient also c/o left lower dental pain x 2 weeks.  BP in triage 176/118. Patient states she stopped taking her BP med because it was not working.
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
## 2021-08-01 NOTE — ED Notes (Signed)
Report given to [facility] adult unit.

## 2021-08-01 NOTE — ED Provider Notes (Signed)
[facility] DEPT Provider Note   CSN: [id number] Arrival date & time: 08/01/21  0737     History  Chief Complaint  Patient presents with   Suicidal   Hypertension   Dental Pain    [name] is a 51 y.o. female with PMHx anxiety, depression, PTSD, and polysubstance abuse who presents to the ED today with complaint of suicidal ideation/depression. Pt reports she has plans of either cutting her wrists or taking multiple pills to end her life. She denies any HI or AVH and is here voluntarily.   Pt is noted to have significantly elevated blood pressure readings in the ED today at 176/118. She notes that she stopped taking all of her medications including her metoprolol 12.5 mg daily about 2 months ago as they were not working for her. She denies any specific headache, vision changes, chest pain, SOB, difficulty urinating.   Pt does complain of diffuse left lower dental pain x 2 weeks. Per chart review she was seen at [facility] on 1/19 for same and prescribed PCN. She states the PCN was making her nauseated so she stopped taking it. She has not followed up with a dentist. She denies any fevers, chills, or facial swelling.   The history is provided by the patient and medical records.      Home Medications Prior to Admission medications   Medication Sig Start Date End Date Taking? Authorizing Provider  acetaminophen (TYLENOL) 500 MG tablet Take 1,000 mg by mouth every 6 (six) hours as needed for mild pain.   Yes [provider]  albuterol (VENTOLIN HFA) 108 (90 Base) MCG/ACT inhaler Inhale 1-2 puffs into the lungs every 6 (six) hours as needed for wheezing or shortness of breath. 04/11/21  Yes Lamptey, Myrene Galas, MD  clindamycin (CLEOCIN) 150 MG capsule Take 2 capsules (300 mg total) by mouth 3 (three) times daily for 7 days. 08/01/21 08/08/21 Yes Eulises Kijowski, PA-C  metoprolol succinate (TOPROL-XL) 25 MG 24 hr tablet Take 0.5 tablets (12.5 mg total) by  mouth daily. 08/01/21 08/31/21 Yes Abhiraj Dozal, PA-C  ARIPiprazole (ABILIFY) 5 MG tablet Take 1 tablet (5 mg total) by mouth daily. 05/10/21   Ethelene Hal, NP  hydrOXYzine (ATARAX/VISTARIL) 25 MG tablet Take 1 tablet (25 mg total) by mouth 3 (three) times daily as needed for anxiety (vistaril 25 mg p.o. for anxiety tid prn). 05/09/21   Ethelene Hal, NP  metoprolol succinate (TOPROL-XL) 25 MG 24 hr tablet Take 0.5 tablets (12.5 mg total) by mouth daily. 05/10/21   Ethelene Hal, NP  sertraline (ZOLOFT) 50 MG tablet Take 1 tablet (50 mg total) by mouth daily. 05/10/21   Ethelene Hal, NP  traZODone (DESYREL) 100 MG tablet Take 1 tablet (100 mg total) by mouth at bedtime as needed for sleep. 05/09/21   Ethelene Hal, NP  clonazePAM (KLONOPIN) 0.5 MG tablet Take 0.5 mg by mouth 3 (three) times daily as needed for anxiety.  04/06/20  [provider]  pantoprazole (PROTONIX) 40 MG tablet Take 40 mg by mouth daily.  10/23/13 04/06/20  [provider]      Allergies    Benadryl [diphenhydramine], Penicillins, and Latex    Review of Systems   Review of Systems  Constitutional:  Negative for chills and fever.  HENT:  Positive for dental problem.   Eyes:  Negative for visual disturbance.  Respiratory:  Negative for shortness of breath.   Cardiovascular:  Negative for chest pain.  Neurological:  Negative for headaches.  Psychiatric/Behavioral:  Positive for suicidal ideas. Negative for self-injury.   All other systems reviewed and are negative.  Physical Exam Updated Vital Signs BP (!) 145/87 (BP Location: Left Arm)    Pulse 92    Temp 98.1 F (36.7 C) (Oral)    Resp 16    Ht 5' 2.5" (1.588 m)    Wt 52 kg    LMP 07/10/2021    SpO2 95%    BMI 20.63 kg/m   Physical Exam Vitals and nursing note reviewed.  Constitutional:      Appearance: She is not ill-appearing.  HENT:     Head: Normocephalic and atraumatic.     Mouth/Throat:      Comments: Multiple missing teeth throughout gumline. Mild gingival erythema and edema to left lower side. No definitive abscess to be drained. No signs of Ludwig's angina.  Eyes:     Conjunctiva/sclera: Conjunctivae normal.  Cardiovascular:     Rate and Rhythm: Normal rate and regular rhythm.     Pulses: Normal pulses.  Pulmonary:     Effort: Pulmonary effort is normal.     Breath sounds: Normal breath sounds. No wheezing, rhonchi or rales.  Abdominal:     Palpations: Abdomen is soft.     Tenderness: There is no abdominal tenderness.  Musculoskeletal:     Cervical back: Neck supple.  Skin:    General: Skin is warm and dry.  Neurological:     Mental Status: She is alert.  Psychiatric:        Mood and Affect: Affect is flat.        Thought Content: Thought content includes suicidal ideation. Thought content includes suicidal plan.    ED Results / Procedures / Treatments   Labs (all labs ordered are listed, but only abnormal results are displayed) Labs Reviewed  SALICYLATE LEVEL - Abnormal; Notable for the following components:      Result Value   Salicylate Lvl <0.2 (*)    All other components within normal limits  ACETAMINOPHEN LEVEL - Abnormal; Notable for the following components:   Acetaminophen (Tylenol), Serum <10 (*)    All other components within normal limits  RAPID URINE DRUG SCREEN, HOSP PERFORMED - Abnormal; Notable for the following components:   Cocaine POSITIVE (*)    All other components within normal limits  RESP PANEL BY RT-PCR (FLU A&B, COVID) ARPGX2  COMPREHENSIVE METABOLIC PANEL  ETHANOL  CBC  TROPONIN I (HIGH SENSITIVITY)  TROPONIN I (HIGH SENSITIVITY)    EKG None  Radiology No results found.  Procedures Procedures    Medications Ordered in ED Medications  metoprolol tartrate (LOPRESSOR) tablet 12.5 mg (12.5 mg Oral Given 08/01/21 0902)    ED Course/ Medical Decision Making/ A&P Clinical Course as of 08/01/21 1349  Mon Aug 01, 2021   1106 BP(!): 145/87 [MV]    Clinical Course User Index [MV] Eustaquio Maize, PA-C                           Medical Decision Making 51 year old female who presents to the ED today with complaint of suicidal ideation and specific plan.  Also mentions having left lower dental pain.  Recently has been treated with penicillin however stopped taking it as it was making her sick.  Chart review she did have a telephone encounter requesting amoxicillin via the urgent care however this was never prescribed.  She has not followed up with  a dentist.  On arrival to the ED today blood pressure is noted to be elevated at 176/118.  Has been noncompliant with her metoprolol 12.5 mg daily for the past 2 months she felt like it was not working.  She is asymptomatic currently.  She is here voluntarily.  We will plan for medical screening/clearance prior to TTS eval.  Will provide metoprolol dose and reevaluate blood pressure.  We will assess for any end-organ damage with lab work including creatinine and troponin as well as EKG. Will plan to discharge home with additional abx with findings on exam consistent with gingival erythema/edema and dentist follow up.   Labwork unremarkable. Pt medically cleared at this time. Pending TTS eval.   TTS has evaluated pt. She has been accepted to [facility] pending COVID testing. Blood pressure improved at 145/87 - pt should take her BP meds daily. Will also discharge with Rx Clindamycin for continued dental pain/infection.   Problems Addressed: Pain, dental: acute illness or injury Primary hypertension: acute illness or injury Suicidal ideation: acute illness or injury  Amount and/or Complexity of Data Reviewed Labs: ordered.    Details: CBC without leukocytosis. Hgb stable at 14.2 CMP without electrolyte abnormalities Troponin 14 Salicylate level negative Acetaminophen level negative Etoh < 10 UDS positive for cocaine. Pt admits to using crack cocaine 2 days  ago.  Risk Prescription drug management.          Final Clinical Impression(s) / ED Diagnoses Final diagnoses:  Suicidal ideation  Pain, dental  Primary hypertension    Rx / DC Orders ED Discharge Orders          Ordered    metoprolol succinate (TOPROL-XL) 25 MG 24 hr tablet  Daily        08/01/21 1349    clindamycin (CLEOCIN) 150 MG capsule  3 times daily        08/01/21 1349              Eustaquio Maize, PA-C 08/01/21 1350    Horton, Alvin Critchley, DO 08/01/21 1542
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
## 2021-08-01 NOTE — ED Notes (Signed)
Pt was informed of [place] regarding dressing out into hospital attire, all belongings to be placed into belonging bags and labeled with pt ID. Pt acknowledged these instructions I provided. Pt was wanded by security prior to transfer into emergency department. Pt had no further questions or concerns at this time.

## 2021-08-01 NOTE — BH Assessment (Addendum)
[place] Assessment Progress Note   Per Sheran Fava, NP , this pt requires psychiatric hospitalization at this time.  Linsey, RN, [facility] has assigned pt to [facility] Rm 406-2 to the service of Dr Caswell Corwin.  [facility] will be ready to receive pt at 15:00.  Pt has signed Voluntary Admission and Consent for Treatment, as well as Consent to Release Information to pt's mother and to a friend, and signed forms have been faxed to [facility].  EDP Lavenia Atlas, DO and pt's nurse, Domingo Dimes, have been notified, and Domingo Dimes agrees to send original paperwork along with pt via [facility] [employer], and to call report to [phone].  Jalene Mullet, [place] Coordinator [phone]

## 2021-08-01 NOTE — ED Notes (Signed)
Pt advises being unable to provide urine sample at this time, will monitor. 

## 2021-08-01 NOTE — ED Notes (Signed)
Attempted to call report on pt, safe transport did not answer.

## 2021-08-01 NOTE — ED Notes (Signed)
Pt dressed out into burgundy scrubs and wanded by security.

## 2021-08-02 DIAGNOSIS — R1310 Dysphagia, unspecified: Secondary | ICD-10-CM | POA: Insufficient documentation

## 2021-08-02 LAB — LIPID PANEL
Cholesterol: 193 mg/dL (ref 0–200)
HDL: 69 mg/dL (ref >40)
LDL Cholesterol: 98 mg/dL (ref 0–99)
Total CHOL/HDL Ratio: 2.8 RATIO
Triglycerides: 128 mg/dL (ref <150)
VLDL: 26 mg/dL (ref 0–40)

## 2021-08-02 LAB — HEMOGLOBIN A1C
Hgb A1c MFr Bld: 5.4 % (ref 4.8–5.6)
Mean Plasma Glucose: 108.28 mg/dL

## 2021-08-02 LAB — TSH: TSH: 1.989 u[IU]/mL (ref 0.350–4.500)

## 2021-08-02 MED ORDER — THIAMINE HCL 100 MG PO TABS
100.0000 mg | ORAL_TABLET | Freq: Every day | ORAL | Status: DC
  Administered 2021-08-03: 100 mg via ORAL
  Filled 2021-08-02 (×2): qty 1

## 2021-08-02 MED ORDER — THIAMINE HCL 100 MG/ML IJ SOLN
100.0000 mg | Freq: Once | INTRAMUSCULAR | Status: AC
  Administered 2021-08-02: 100 mg via INTRAMUSCULAR
  Filled 2021-08-02: qty 2

## 2021-08-02 MED ORDER — LORAZEPAM 1 MG PO TABS: 1.0000 mg | ORAL_TABLET | Freq: Four times a day (QID) | ORAL | Status: AC | PRN

## 2021-08-02 MED ORDER — AMLODIPINE BESYLATE 5 MG PO TABS
5.0000 mg | ORAL_TABLET | Freq: Every day | ORAL | Status: DC
  Administered 2021-08-03 – 2021-08-07 (×5): 5 mg via ORAL
  Filled 2021-08-02 (×7): qty 1

## 2021-08-02 MED ORDER — ONDANSETRON 4 MG PO TBDP: 4.0000 mg | ORAL_TABLET | Freq: Four times a day (QID) | ORAL | Status: AC | PRN

## 2021-08-02 MED ORDER — CLINDAMYCIN HCL 300 MG PO CAPS
300.0000 mg | ORAL_CAPSULE | Freq: Three times a day (TID) | ORAL | Status: DC
  Administered 2021-08-03 – 2021-08-07 (×13): 300 mg via ORAL
  Filled 2021-08-02 (×17): qty 1

## 2021-08-02 MED ORDER — LORAZEPAM 1 MG PO TABS: 1.0000 mg | ORAL_TABLET | ORAL | Status: DC | PRN

## 2021-08-02 MED ORDER — ZIPRASIDONE MESYLATE 20 MG IM SOLR: 20.0000 mg | INTRAMUSCULAR | Status: DC | PRN

## 2021-08-02 MED ORDER — ADULT MULTIVITAMIN W/MINERALS CH
1.0000 | ORAL_TABLET | Freq: Every day | ORAL | Status: DC
  Administered 2021-08-02 – 2021-08-07 (×6): 1 via ORAL
  Filled 2021-08-02 (×9): qty 1

## 2021-08-02 MED ORDER — HYDROXYZINE HCL 25 MG PO TABS
25.0000 mg | ORAL_TABLET | Freq: Four times a day (QID) | ORAL | Status: AC | PRN
  Administered 2021-08-04 – 2021-08-05 (×2): 25 mg via ORAL
  Filled 2021-08-02 (×3): qty 1

## 2021-08-02 MED ORDER — LOPERAMIDE HCL 2 MG PO CAPS: 2.0000 mg | ORAL_CAPSULE | ORAL | Status: AC | PRN

## 2021-08-02 MED ORDER — AMLODIPINE BESYLATE 2.5 MG PO TABS
2.5000 mg | ORAL_TABLET | Freq: Every day | ORAL | Status: DC
  Administered 2021-08-02: 2.5 mg via ORAL
  Filled 2021-08-02 (×2): qty 1

## 2021-08-02 MED ORDER — OLANZAPINE 5 MG PO TBDP: 5.0000 mg | ORAL_TABLET | Freq: Three times a day (TID) | ORAL | Status: DC | PRN

## 2021-08-02 MED ORDER — NICOTINE 14 MG/24HR TD PT24
14.0000 mg | MEDICATED_PATCH | Freq: Every day | TRANSDERMAL | Status: DC
  Filled 2021-08-02 (×3): qty 1

## 2021-08-02 NOTE — BHH Suicide Risk Assessment (Signed)
[facility] INPATIENT:  Family/Significant Other Suicide Prevention Education  Suicide Prevention Education:  Education Completed; Thayer Jew,  [phone] (name of family member/significant other) has been identified by the patient as the family member/significant other with whom the patient will be residing, and identified as the person(s) who will aid the patient in the event of a mental health crisis (suicidal ideations/suicide attempt).  With written consent from the patient, the family member/significant other has been provided the following suicide prevention education, prior to the and/or following the discharge of the patient.  CSW spoke with patient boyfriend who reports that patient had stopped taking medications which made her more depressed and she started using substances again. Boyfriend reports that he noticed that she was more anxious.  She was going to the [facility] but they only offered groups and he feels like she needs one on one therapy.  Boyfriend reports that it will be important for patient to stabilize on medications and provide education about importance of staying on medications.  Patient can return home. Safe environment. Boyfriend in recovery.  No guns/weapons and no additional safety concerns.   The suicide prevention education provided includes the following: Suicide risk factors Suicide prevention and interventions National Suicide Hotline telephone number [facility] assessment telephone number [facility] [place] and/or Residential Mobile Crisis Unit telephone number  Request made of family/significant other to: Remove weapons (e.g., guns, rifles, knives), all items previously/currently identified as safety concern.   Remove drugs/medications (over-the-counter, prescriptions, illicit drugs), all items previously/currently identified as a safety concern.  The family member/significant other verbalizes understanding of the  suicide prevention education information provided.  The family member/significant other agrees to remove the items of safety concern listed above.  Cathryne Mancebo E Bambie Pizzolato 08/02/2021, 2:58 PM
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
## 2021-08-02 NOTE — Progress Notes (Signed)
°   08/02/21 0000  Psych Admission Type (Psych Patients Only)  Admission Status Voluntary  Psychosocial Assessment  Patient Complaints Anxiety  Eye Contact Glaring  Facial Expression Sad;Flat  Affect Flat  Speech Pressured  Interaction Cautious  Motor Activity Slow  Appearance/Hygiene Unremarkable  Behavior Characteristics Cooperative;Appropriate to situation  Mood Anxious  Thought Process  Coherency Blocking  Content WDL  Delusions None reported or observed  Perception WDL  Hallucination None reported or observed  Judgment Poor  Confusion None  Danger to Self  Current suicidal ideation? Denies  Danger to Others  Danger to Others None reported or observed

## 2021-08-02 NOTE — Progress Notes (Signed)
°   08/02/21 0820  Psych Admission Type (Psych Patients Only)  Admission Status Voluntary  Psychosocial Assessment  Patient Complaints Anxiety  Eye Contact Fair  Facial Expression Flat  Affect Flat  Speech Logical/coherent  Interaction Minimal;Isolative  Motor Activity Slow  Appearance/Hygiene Unremarkable  Behavior Characteristics Cooperative;Appropriate to situation  Mood Anxious  Thought Process  Coherency WDL  Content WDL  Delusions None reported or observed  Perception WDL  Hallucination None reported or observed  Judgment Poor  Confusion None  Danger to Self  Current suicidal ideation? Denies  Danger to Others  Danger to Others None reported or observed

## 2021-08-02 NOTE — Hospital Course (Addendum)
[name] is a 51 y.o. female with PPHx of MDD, GAD, cocaine abuse, cannabis abuse, alcohol abuse, 2 prior admission to [facility], who presented to [facility] for active SI with plan to cut her wrist and take some pills then admitted Voluntary to [facility] for treatment of MDD exacerbated by recent cocaine 2 days ago and ultimatum with current boyfriend to either quit using cocaine where she cannot live with them anymore.  Patient reported "medicines not working anymore" however she was discontinue her psychotropics after about a month of adherence while using cocaine.   During the patient's hospitalization, patient had extensive initial psychiatric evaluation, and follow-up psychiatric evaluations every day.  Psychiatric diagnoses provided upon initial assessment:  MDD, GAD, PTSD, PUD  Patient's psychiatric medications were adjusted on admission:  Restarted Abilify 5 mg daily Restarted Zoloft 50 mg daily  During the hospitalization, other adjustments were made to the patient's psychiatric medication regimen:  Increase Zoloft to 75 mg daily Discontinued Abilify for no indications Started Trileptal 100 mg BID for mood lability and irritability
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
## 2021-08-02 NOTE — Group Note (Signed)
Recreation Therapy Group Note   Group Topic:Animal Assisted Therapy   Group Date: 08/02/2021 Start Time: 1430 End Time: 1510 Facilitators: Victorino Sparrow, LRT,CTRS Location: [address] Day[address]   Animal-Assisted Activity (AAA) Program Checklist/Progress Note Patient Eligibility Criteria Checklist & Daily Group note for Rec Tx Intervention  AAA/T Program Assumption of Risk Form signed by Patient/ or Parent Legal Guardian YES  Patient understands their participation is voluntary YES  Group Description: Patients provided opportunity to interact with trained and credentialed [employer] Therapy dog and the community volunteer/dog handler. Patients practiced appropriate animal interaction and were educated on dog safety outside of the hospital in common community settings. Patients were allowed to use dog toys and other items to practice commands, engage the dog in play, and/or complete routine aspects of animal care.   Education: Contractor, Pensions consultant, Communication & Social Skills    Affect/Mood: N/A   Participation Level: Did not attend    Clinical Observations/Individualized Feedback:      Plan: Continue to engage patient in RT group sessions 2-3x/week.   Victorino Sparrow, LRT,CTRS 08/02/2021 4:04 PM

## 2021-08-02 NOTE — Plan of Care (Signed)
°  Problem: Coping: Goal: Ability to verbalize frustrations and anger appropriately will improve Outcome: Progressing   Problem: Coping: Goal: Ability to demonstrate self-control will improve Outcome: Progressing   Problem: Health Behavior/Discharge Planning: Goal: Compliance with treatment plan for underlying cause of condition will improve Outcome: Progressing   Problem: Safety: Goal: Periods of time without injury will increase Outcome: Progressing   Problem: Coping: Goal: Will verbalize feelings Outcome: Progressing

## 2021-08-03 ENCOUNTER — Encounter (HOSPITAL_COMMUNITY): Payer: Self-pay

## 2021-08-03 LAB — RPR
RPR Ser Ql: REACTIVE — AB
RPR Titer: 1:2 {titer}

## 2021-08-03 LAB — HIV ANTIBODY (ROUTINE TESTING W REFLEX): HIV Screen 4th Generation wRfx: NONREACTIVE

## 2021-08-03 MED ORDER — SERTRALINE HCL 50 MG PO TABS
75.0000 mg | ORAL_TABLET | Freq: Every day | ORAL | Status: DC
  Administered 2021-08-04 – 2021-08-07 (×4): 75 mg via ORAL
  Filled 2021-08-03 (×6): qty 1

## 2021-08-03 MED ORDER — BOOST PLUS PO LIQD
237.0000 mL | Freq: Three times a day (TID) | ORAL | Status: DC
  Administered 2021-08-03 – 2021-08-06 (×9): 237 mL via ORAL
  Filled 2021-08-03 (×15): qty 237

## 2021-08-03 MED ORDER — THIAMINE HCL 100 MG PO TABS
100.0000 mg | ORAL_TABLET | Freq: Every day | ORAL | Status: AC
  Administered 2021-08-04 – 2021-08-07 (×4): 100 mg via ORAL
  Filled 2021-08-03 (×5): qty 1

## 2021-08-03 MED ORDER — MELATONIN 3 MG PO TABS
3.0000 mg | ORAL_TABLET | Freq: Every day | ORAL | Status: DC
  Administered 2021-08-03 – 2021-08-06 (×4): 3 mg via ORAL
  Filled 2021-08-03 (×7): qty 1

## 2021-08-03 MED ORDER — WHITE PETROLATUM EX OINT
TOPICAL_OINTMENT | CUTANEOUS | Status: AC
  Filled 2021-08-03: qty 5

## 2021-08-03 NOTE — Progress Notes (Signed)
°   08/02/21 2245  Psych Admission Type (Psych Patients Only)  Admission Status Voluntary  Psychosocial Assessment  Patient Complaints Anxiety  Eye Contact Fair  Facial Expression Flat  Affect Flat  Speech Logical/coherent  Interaction Minimal;Isolative  Motor Activity Slow  Appearance/Hygiene Unremarkable  Behavior Characteristics Cooperative  Mood Depressed  Thought Process  Coherency WDL  Content WDL  Delusions None reported or observed  Perception WDL  Hallucination None reported or observed  Judgment Poor  Confusion None  Danger to Self  Current suicidal ideation? Denies  Danger to Others  Danger to Others None reported or observed

## 2021-08-03 NOTE — Group Note (Signed)
Recreation Therapy Group Note   Group Topic:Stress Management  Group Date: 08/03/2021 Start Time: 0932 End Time: 0958 Facilitators: Victorino Sparrow, [place] Location: [address] Dayroom   Goal Area(s) Addresses:  Patient will identify positive stress management techniques. Patient will identify benefits of using stress management post d/c.  Group Description:  Stress Release Meditation.  LRT played a meditation that focused on breathing and releasing tension through tensing and releasing the muscles.  Patients were to focus on the meditation to fully engage.    Affect/Mood: Appropriate   Participation Level: Active   Participation Quality: Independent   Behavior: Appropriate   Speech/Thought Process: Focused   Insight: Good   Judgement: Good   Modes of Intervention: Meditation   Patient Response to Interventions:  Attentive   Education Outcome:  Acknowledges education and In group clarification offered    Clinical Observations/Individualized Feedback: Pt attended and participated in activity.    Plan: Continue to engage patient in RT group sessions 2-3x/week.   Victorino Sparrow, Glennis Brink 08/03/2021 12:37 PM

## 2021-08-03 NOTE — Progress Notes (Signed)
[facility] MD Progress Note  08/03/2021 6:10 PM [name]  MRN: [id number] CC: SI  Subjective: [name] is a 51 y.o. female with PPHx of MDD, GAD, cocaine abuse, cannabis abuse, alcohol abuse, 2 prior admission to [facility], who presented to [facility] ED for active SI with plan to cut her wrist and take some pills then admitted Voluntary to [facility] for treatment of MDD exacerbated by recent cocaine 2 days ago and ultimatum with current boyfriend to either quit using cocaine where she cannot live with them anymore.  Patient reported "medicines not working anymore" however she was discontinue her psychotropics after about a month of adherence while using cocaine.  [place] Day 2    Interim History: Patient was evaluated today at interview room on 300 hall. Patient was anxious and depressed, but was pleasant and cooperative with exam. She reported minimal improvement in sleep and mood, however continued to report decreased energy, concentration, appetite, depression and anxiety. Patient brought up concerns of mood lability, and requested a mood stabilizer that was effective for her in the past. Patient also requested Boost as she noted poor appetite still. Patient denied medication side effects and is tolerating it well. Patient denied SI/HI/AVH, delusions, paranoia, first rank symptoms, and contracted to safety on the unit. Patient was not grossly responding to internal/external stimuli nor made any delusional statements during encounter. Patient had no other concerns.  Diagnosis:  Principal Problem:   MDD (major depressive disorder), recurrent episode, severe (HCC) Active Problems:   PTSD (post-traumatic stress disorder)   Polysubstance abuse (HCC)   GAD (generalized anxiety disorder)   Cocaine dependence (HCC)   Alcohol use disorder, moderate, dependence ([place])   Benign essential HTN   Cannabis use disorder, moderate, dependence ([place])   Tobacco use disorder   Past Psychiatric  History: See H&P  Past Medical History:  Past Medical History:  Diagnosis Date   Acid reflux    Anxiety    Asthma    Drug abuse ([place])    ETOH abuse    Hypertension    Manic-depressive disorder ([place])    PTSD (post-traumatic stress disorder)     Past Surgical History:  Procedure Laterality Date   TUBAL LIGATION     Family History:  Family History  Problem Relation Age of Onset   Diabetes Mother    Hypertension Mother    Family Psychiatric  History: See H&P Social History:  Social History   Substance and Sexual Activity  Alcohol Use Yes     Social History   Substance and Sexual Activity  Drug Use Yes   Types: Cocaine   Comment: crack    Social History   Socioeconomic History   Marital status: Divorced    Spouse name: Not on file   Number of children: Not on file   Years of education: Not on file   Highest education level: Not on file  Occupational History   Not on file  Tobacco Use   Smoking status: Every Day    Packs/day: 0.50    Years: 29.00    Pack years: 14.50    Types: Cigarettes   Smokeless tobacco: Never   Tobacco comments:    in rehab  Vaping Use   Vaping Use: Never used  Substance and Sexual Activity   Alcohol use: Yes   Drug use: Yes    Types: Cocaine    Comment: crack   Sexual activity: Yes    Birth control/protection: None  Other Topics  Concern   Not on file  Social History Narrative   Not on file   Social Determinants of Health   Financial Resource Strain: Not on file  Food Insecurity: Not on file  Transportation Needs: Not on file  Physical Activity: Not on file  Stress: Not on file  Social Connections: Not on file   Additional Social History:                         Sleep: Per above  Appetite:  Per above  Current Medications: Current Facility-Administered Medications  Medication Dose Route Frequency Provider Last Rate Last Admin   acetaminophen (TYLENOL) tablet 650 mg  650 mg Oral Q6H PRN [name], FNP   650 mg at 08/03/21 0827   albuterol (VENTOLIN HFA) 108 (90 Base) MCG/ACT inhaler 1-2 puff  1-2 puff Inhalation Q6H PRN [name], FNP   1 puff at 08/02/21 0819   alum & mag hydroxide-simeth (MAALOX/MYLANTA) 200-200-20 MG/5ML suspension 30 mL  30 mL Oral Q4H PRN [name], FNP       amLODipine (NORVASC) tablet 5 mg  5 mg Oral Daily [name], DO   5 mg at 08/03/21 2751   ARIPiprazole (ABILIFY) tablet 5 mg  5 mg Oral Daily [name], FNP   5 mg at 08/03/21 0818   clindamycin (CLEOCIN) capsule 300 mg  300 mg Oral TID [name], DO   300 mg at 08/03/21 1659   hydrOXYzine (ATARAX) tablet 25 mg  25 mg Oral Q6H PRN [name], DO       lactose free nutrition (BOOST PLUS) liquid 237 mL  237 mL Oral TID WC [name], DO   237 mL at 08/03/21 1147   loperamide (IMODIUM) capsule 2-4 mg  2-4 mg Oral PRN [name], DO       LORazepam (ATIVAN) tablet 1 mg  1 mg Oral Q6H PRN [name], DO       OLANZapine zydis (ZYPREXA) disintegrating tablet 5 mg  5 mg Oral Q8H PRN [name], DO       And   LORazepam (ATIVAN) tablet 1 mg  1 mg Oral PRN [name], DO       And   ziprasidone (GEODON) injection 20 mg  20 mg Intramuscular PRN [name], DO       magnesium hydroxide (MILK OF MAGNESIA) suspension 30 mL  30 mL Oral Daily PRN [name], FNP       melatonin tablet 3 mg  3 mg Oral QHS [name], DO       multivitamin with minerals tablet 1 tablet  1 tablet Oral Daily [name], DO   1 tablet at 08/03/21 0818   nicotine (NICODERM CQ - dosed in mg/24 hours) patch 14 mg  14 mg Transdermal Daily [name], DO       ondansetron (ZOFRAN-ODT) disintegrating tablet 4 mg  4 mg Oral Q6H PRN [name], DO       [START ON 08/04/2021] sertraline (ZOLOFT) tablet 75 mg  75 mg Oral Daily [name], DO       [START ON 08/04/2021] thiamine tablet 100 mg  100 mg Oral Daily [name], DO       traZODone (DESYREL)  tablet 100 mg  100 mg Oral QHS PRN [name], FNP   100 mg at 08/01/21 2032   white petrolatum (VASELINE) gel  Lab Results:  Results for orders placed or performed during the hospital encounter of 08/01/21 (from the past 48 hour(s))  Lipid panel     Status: None   Collection Time: 08/02/21  6:14 AM  Result Value Ref Range   Cholesterol 193 0 - 200 mg/dL   Triglycerides 128 <150 mg/dL   HDL 69 >40 mg/dL   Total CHOL/HDL Ratio 2.8 RATIO   VLDL 26 0 - 40 mg/dL   LDL Cholesterol 98 0 - 99 mg/dL    Comment:        Total Cholesterol/HDL:CHD Risk Coronary Heart Disease Risk Table                     Men   Women  1/2 Average Risk   3.4   3.3  Average Risk       5.0   4.4  2 X Average Risk   9.6   7.1  3 X Average Risk  23.4   11.0        Use the calculated Patient Ratio above and the CHD Risk Table to determine the patient's CHD Risk.        ATP III CLASSIFICATION (LDL):  <100     mg/dL   Optimal  100-129  mg/dL   Near or Above                    Optimal  130-159  mg/dL   Borderline  160-189  mg/dL   High  >190     mg/dL   Very High Performed at [facility] [address], [place], [place] [address]   TSH     Status: None   Collection Time: 08/02/21  6:14 AM  Result Value Ref Range   TSH 1.989 0.350 - 4.500 uIU/mL    Comment: Performed by a 3rd Generation assay with a functional sensitivity of <=0.01 uIU/mL. Performed at [facility], [place] [address], [place], [place] [address]   Hemoglobin A1c     Status: None   Collection Time: 08/02/21  6:14 AM  Result Value Ref Range   Hgb A1c MFr Bld 5.4 4.8 - 5.6 %    Comment: (NOTE) Pre diabetes:          5.7%-6.4%  Diabetes:              >6.4%  Glycemic control for   <7.0% adults with diabetes    Mean Plasma Glucose 108.28 mg/dL    Comment: Performed at [facility] [address], [place], [place] [address]  RPR     Status: Abnormal   Collection Time:  08/02/21  6:26 PM  Result Value Ref Range   RPR Ser Ql Reactive (A) NON REACTIVE    Comment: SENT FOR CONFIRMATION   RPR Titer 1:2     Comment: Performed at [facility], [place] [address] [place], [place] [address]  HIV Antibody (routine testing w rflx)     Status: None   Collection Time: 08/03/21  6:29 AM  Result Value Ref Range   HIV Screen 4th Generation wRfx Non Reactive Non Reactive    Comment: Performed at [facility], [place] [address] [place], [place] [address]    Blood Alcohol level:  Lab Results  Component Value Date   [facility] <10 08/01/2021   ETH <10 61/95/0932    Metabolic Disorder Labs: Lab Results  Component Value Date   HGBA1C  5.4 08/02/2021   MPG 108.28 08/02/2021   MPG 131.24 05/04/2021   No results found for: PROLACTIN Lab Results  Component Value Date   CHOL 193 08/02/2021   TRIG 128 08/02/2021   HDL 69 08/02/2021   CHOLHDL 2.8 08/02/2021   VLDL 26 08/02/2021   LDLCALC 98 08/02/2021   LDLCALC 97 05/04/2021    Physical Findings:  Musculoskeletal: Strength & Muscle Tone: within normal limits Gait & Station: normal Patient leans: N/A  Psychiatric Specialty Exam: Physical Exam  Review of Systems  Body mass index is 20.16 kg/m. Temp:  [97.8 F (36.6 C)-98.3 F (36.8 C)] 98.3 F (36.8 C) (02/08 1608) Pulse Rate:  [47-83] 83 (02/08 1608) BP: (138-151)/(88-99) 144/90 (02/08 1608) SpO2:  [97 %-100 %] 100 % (02/08 1608)  General Appearance:  Appropriate for Environment; Casual; Fairly Groomed    Eye Contact:   Good    Speech:   Clear and Coherent; Normal Rate    Volume:   Normal    Mood:   Depressed; Anxious    Affect:   Appropriate; Congruent; Constricted; Depressed    Thought Process:   Coherent; Goal Directed; Linear   Descriptions of Associations: Circumstantial  Duration of Psychotic Symptoms: N/A Past Diagnosis of Schizophrenia or Psychoactive disorder:  No   Orientation:   Full (Time, Place and Person)     Thought Content:   Rumination  Hallucinations: None  Ideas of reference: None    Suicidal Thoughts:   No    Homicidal Thoughts:   No    Memory:   Immediate Good; Recent Fair; Remote Fair    Judgement:   Fair    Insight:   Shallow    Psychomotor Activity:   Normal    Concentration:   Fair    Attention Span:   Fair   Recall:   [employer] of Knowledge:   Fair    Language:   Fair    Akathisia:  NA  Handed:   Right    Assets:   Communication Skills; Desire for Improvement; Housing; Intimacy; Social Support    ADL's:  Intact  Cognition:  WNL  Sleep:   Sleep: Fair  Number of Hours: 8  AIMS:  , ,  ,  ,     CIWA:  CIWA-Ar Total: 2 COWS:  COWS Total Score: 0       Treatment Plan & Assessment Summary: Principal Problem:   MDD (major depressive disorder), recurrent episode, severe (HCC) Active Problems:   PTSD (post-traumatic stress disorder)   Polysubstance abuse (HCC)   GAD (generalized anxiety disorder)   Cocaine dependence ([place])   Alcohol use disorder, moderate, dependence ([place])   Benign essential HTN   Cannabis use disorder, moderate, dependence ([place])   Tobacco use disorder   [name] is a 51 y.o. female with PPHx of MDD, GAD, cocaine abuse, cannabis abuse, alcohol abuse, 2 prior admission to [facility], who presented to [facility] ED for active SI with plan to cut her wrist and take some pills then admitted Voluntary to [facility] for treatment of MDD exacerbated by recent cocaine 2 days ago and ultimatum with current boyfriend to either quit using cocaine where she cannot live with them anymore.  Patient reported "medicines not working anymore" however she was discontinue her psychotropics after about a month of adherence while using cocaine.  [place] Day 2   PLAN: MDD, GAD Patient reported some improvement in mood and sleep, but continued to  report decreased energy and poor appetite today. She requested boost. Decided to change  medications one at a time and to continue Abilify 5 mg. However patient reported irritability at home and requested a mood stabilizer. Will discuss switching abilify for a mood stabilizer. Continued home Abilify 5 mg daily Continued Zoloft 75 mg daily - will consider up titration depending on sxs and tolerability  Started Boost for poor appetite   PTSD Patient reported symptoms of intrusive recurrent memories that are distressing the result and anxiety attacks.  She also has symptoms of hypervigilance and exaggerated startle response along with irritability. Continued Zoloft per above   PUD (cocaine, EtOH, cannabis, nicotine) Patient reported daily cocaine use, which was confirmed by collateral. She denied IVDU ever. Encouraged cessation Patient is amenable to outpatient therapy and possibly residential rehabilitation as she has in the past Continued Nicotine replacement therapy Continued  CIWA with ativan per protocol (CIWA >10) Continued COWS We will inquire with CSW follow-up options for patient   Health maintenance Given substance abuse, high risk activities, poor outpatient follow-up, and lesion on her lip that started 4 days ago.  Patient was amenable to the following work-up. Follow-up HIV, RPR, GC chlamydia - Pending   Dental/gum pain/infection Patient was started on clindamycin in the ED, we will continue this. Continued clindamycin 300 mg TID x7 days (end 2/14)   Hypertension 2/2 chronic cocaine abuse. Plan to titrate up amlodipine every 2-3 days to avoid hypotension. Continued amlodipine 5 mg daily Will consider clonidine PRN if patient's BP increases   Dispo: Patient, No Pcp Per (Inactive)  Location: back home [facility]? ACTT? Family meeting with Thayer Jew Tuesday, February 7 at 2 PM  Safety, monitoring and disposition planning: The patient was seen and evaluated on the unit.  The patient's chart was reviewed and nursing notes were reviewed.  The patient's case was  discussed in multidisciplinary team meeting.  Plan and drug side effects were discussed with patient who was amendable. Social work and case management to assist with discharge planning and identification of hospital follow-up needs prior to discharge. Discharge Concerns: Need to establish a safety plan; Medication compliance and effectiveness Discharge Goals: Return home with outpatient referrals for mental health follow-up including medication management/psychotherapy Safety and Monitoring: Voluntary status at inpatient psychiatric unit for safety, stabilization and treatment Daily contact with patient to assess and evaluate symptoms and progress in treatment and medical management Patient's case to be discussed in multi-disciplinary team meeting Observation Level: Per above Vital signs:  q12 hours Precautions: suicide   Signed: [name], DO Psychiatry Resident, PGY-1 [facility] 08/03/2021, 6:10 PM

## 2021-08-03 NOTE — Group Note (Signed)
Date:  08/03/2021 Time:  12:58 PM  Group Topic/Focus:  Healthy Communication:   The focus of this group is to discuss communication, barriers to communication, as well as healthy ways to communicate with others.    Participation Level:  Active  Participation Quality:  Appropriate  Affect:  Appropriate  Cognitive:  Appropriate  Insight: Appropriate  Engagement in Group:  Engaged  Modes of Intervention:  Education  Additional Comments:  Patient was engaged and asked questions concerning the Pros and Cons of healthy  communication.   Jerrye Beavers 08/03/2021, 12:58 PM

## 2021-08-03 NOTE — Group Note (Signed)
[place] LCSW Group Therapy Note   Group Date: 08/03/2021 Start Time: 1300 End Time: 1400   Type of Therapy/Topic:  Group Therapy:  Emotion Regulation  Participation Level:  Did Not Attend     Description of Group:    The purpose of this group is to assist patients in learning to regulate negative emotions and experience positive emotions. Patients will be guided to discuss ways in which they have been vulnerable to their negative emotions. These vulnerabilities will be juxtaposed with experiences of positive emotions or situations, and patients challenged to use positive emotions to combat negative ones. Special emphasis will be placed on coping with negative emotions in conflict situations, and patients will process healthy conflict resolution skills.  Therapeutic Goals: Patient will identify two positive emotions or experiences to reflect on in order to balance out negative emotions:  Patient will label two or more emotions that they find the most difficult to experience:  Patient will be able to demonstrate positive conflict resolution skills through discussion or role plays:   Summary of Patient Progress:   Did not attend    Therapeutic Modalities:   Cognitive Behavioral Therapy Feelings Identification Dialectical [place], LCSW

## 2021-08-03 NOTE — Progress Notes (Addendum)
D: Patient remains isolative to her room. She is compliant with her medications. She denies any thoughts of self harm. Patient denies any withdrawal symptoms. She remains on CIWA: however, her scores have been low. She denies any AVH.   A: Continue to monitor medication management and MD orders.  Safety checks completed every 15 minutes per protocol.  Offer support and encouragement as needed.  R: Patient is receptive to staff; her behavior is appropriate.     08/03/21 0900  Psych Admission Type (Psych Patients Only)  Admission Status Voluntary  Psychosocial Assessment  Patient Complaints Anxiety  Eye Contact Fair  Facial Expression Flat  Affect Flat  Speech Logical/coherent  Interaction Minimal;Isolative  Motor Activity Slow  Appearance/Hygiene Unremarkable  Behavior Characteristics Cooperative  Mood Pleasant  Thought Process  Coherency WDL  Content WDL  Delusions None reported or observed  Perception WDL  Hallucination None reported or observed  Judgment Poor  Confusion None  Danger to Self  Current suicidal ideation? Denies  Danger to Others  Danger to Others None reported or observed

## 2021-08-03 NOTE — BH IP Treatment Plan (Signed)
Interdisciplinary Treatment and Diagnostic Plan Update  08/03/2021 Time of Session: 9:15am  [name] MRN: [id number]  Principal Diagnosis: MDD (major depressive disorder), recurrent episode, severe ([place])  Secondary Diagnoses: Principal Problem:   MDD (major depressive disorder), recurrent episode, severe ([name]) Active Problems:   Polysubstance abuse ([name])   GAD (generalized anxiety disorder)   Cocaine dependence ([name])   Alcohol use disorder, moderate, dependence ([place])   Benign essential HTN   Cannabis use disorder, moderate, dependence ([name])   Tobacco use disorder   PTSD (post-traumatic stress disorder)   Current Medications:  Current Facility-Administered Medications  Medication Dose Route Frequency Provider Last Rate Last Admin   acetaminophen (TYLENOL) tablet 650 mg  650 mg Oral Q6H PRN [name], FNP   650 mg at 08/03/21 0827   albuterol (VENTOLIN HFA) 108 (90 Base) MCG/ACT inhaler 1-2 puff  1-2 puff Inhalation Q6H PRN [name], FNP   1 puff at 08/02/21 0819   alum & mag hydroxide-simeth (MAALOX/MYLANTA) 200-200-20 MG/5ML suspension 30 mL  30 mL Oral Q4H PRN [name], FNP       amLODipine (NORVASC) tablet 5 mg  5 mg Oral Daily [name], DO   5 mg at 08/03/21 2536   ARIPiprazole (ABILIFY) tablet 5 mg  5 mg Oral Daily [name], FNP   5 mg at 08/03/21 0818   clindamycin (CLEOCIN) capsule 300 mg  300 mg Oral TID [name], DO   300 mg at 08/03/21 1147   hydrOXYzine (ATARAX) tablet 25 mg  25 mg Oral Q6H PRN [name], DO       lactose free nutrition (BOOST PLUS) liquid 237 mL  237 mL Oral TID WC [name], DO   237 mL at 08/03/21 1147   loperamide (IMODIUM) capsule 2-4 mg  2-4 mg Oral PRN [name], DO       LORazepam (ATIVAN) tablet 1 mg  1 mg Oral Q6H PRN [name], DO       OLANZapine zydis (ZYPREXA) disintegrating tablet 5 mg  5 mg Oral Q8H PRN [name], DO       And   LORazepam (ATIVAN)  tablet 1 mg  1 mg Oral PRN [name], DO       And   ziprasidone (GEODON) injection 20 mg  20 mg Intramuscular PRN [name], DO       magnesium hydroxide (MILK OF MAGNESIA) suspension 30 mL  30 mL Oral Daily PRN [name], FNP       melatonin tablet 3 mg  3 mg Oral QHS [name], DO       multivitamin with minerals tablet 1 tablet  1 tablet Oral Daily [name], DO   1 tablet at 08/03/21 0818   nicotine (NICODERM CQ - dosed in mg/24 hours) patch 14 mg  14 mg Transdermal Daily [name], DO       ondansetron (ZOFRAN-ODT) disintegrating tablet 4 mg  4 mg Oral Q6H PRN [name], DO       [START ON 08/04/2021] sertraline (ZOLOFT) tablet 75 mg  75 mg Oral Daily [name], DO       [START ON 08/04/2021] thiamine tablet 100 mg  100 mg Oral Daily [name], DO       traZODone (DESYREL) tablet 100 mg  100 mg Oral QHS PRN [name], FNP   100 mg at 08/01/21 2032   white petrolatum (VASELINE) gel  PTA Medications: Medications Prior to Admission  Medication Sig Dispense Refill Last Dose   acetaminophen (TYLENOL) 500 MG tablet Take 1,000 mg by mouth every 6 (six) hours as needed for mild pain.      albuterol (VENTOLIN HFA) 108 (90 Base) MCG/ACT inhaler Inhale 1-2 puffs into the lungs every 6 (six) hours as needed for wheezing or shortness of breath. 18 g 0    ARIPiprazole (ABILIFY) 5 MG tablet Take 1 tablet (5 mg total) by mouth daily. 30 tablet 0    clindamycin (CLEOCIN) 150 MG capsule Take 2 capsules (300 mg total) by mouth 3 (three) times daily for 7 days. 42 capsule 0    hydrOXYzine (ATARAX/VISTARIL) 25 MG tablet Take 1 tablet (25 mg total) by mouth 3 (three) times daily as needed for anxiety (vistaril 25 mg p.o. for anxiety tid prn). 30 tablet 0    metoprolol succinate (TOPROL-XL) 25 MG 24 hr tablet Take 0.5 tablets (12.5 mg total) by mouth daily. 30 tablet 0    metoprolol succinate (TOPROL-XL) 25 MG 24 hr tablet Take 0.5 tablets (12.5 mg  total) by mouth daily. 15 tablet 0    sertraline (ZOLOFT) 50 MG tablet Take 1 tablet (50 mg total) by mouth daily. 30 tablet 0    traZODone (DESYREL) 100 MG tablet Take 1 tablet (100 mg total) by mouth at bedtime as needed for sleep. 30 tablet 0     Patient Stressors:    Patient Strengths:    Treatment Modalities: Medication Management, Group therapy, Case management,  1 to 1 session with clinician, Psychoeducation, Recreational therapy.   Physician Treatment Plan for Primary Diagnosis: MDD (major depressive disorder), recurrent episode, severe ([place]) Long Term Goal(s):     Short Term Goals:    Medication Management: Evaluate patient's response, side effects, and tolerance of medication regimen.  Therapeutic Interventions: 1 to 1 sessions, Unit Group sessions and Medication administration.  Evaluation of Outcomes: Not Met  Physician Treatment Plan for Secondary Diagnosis: Principal Problem:   MDD (major depressive disorder), recurrent episode, severe ([facility]) Active Problems:   Polysubstance abuse ([facility])   GAD (generalized anxiety disorder)   Cocaine dependence ([place])   Alcohol use disorder, moderate, dependence ([name])   Benign essential HTN   Cannabis use disorder, moderate, dependence ([name])   Tobacco use disorder   PTSD (post-traumatic stress disorder)  Long Term Goal(s):     Short Term Goals:       Medication Management: Evaluate patient's response, side effects, and tolerance of medication regimen.  Therapeutic Interventions: 1 to 1 sessions, Unit Group sessions and Medication administration.  Evaluation of Outcomes: Not Met   RN Treatment Plan for Primary Diagnosis: MDD (major depressive disorder), recurrent episode, severe ([name]) Long Term Goal(s): Knowledge of disease and therapeutic regimen to maintain health will improve  Short Term Goals: Ability to remain free from injury will improve, Ability to participate in decision making will improve, Ability to verbalize  feelings will improve, Ability to disclose and discuss suicidal ideas, and Ability to identify and develop effective coping behaviors will improve  Medication Management: RN will administer medications as ordered by provider, will assess and evaluate patient's response and provide education to patient for prescribed medication. RN will report any adverse and/or side effects to prescribing provider.  Therapeutic Interventions: 1 on 1 counseling sessions, Psychoeducation, Medication administration, Evaluate responses to treatment, Monitor vital signs and CBGs as ordered, Perform/monitor CIWA, COWS, AIMS and Fall Risk screenings as ordered, Perform wound care treatments as ordered.  Evaluation of Outcomes: Not Met   LCSW Treatment Plan for Primary Diagnosis: MDD (major depressive disorder), recurrent episode, severe ([place]) Long Term Goal(s): Safe transition to appropriate next level of care at discharge, Engage patient in therapeutic group addressing interpersonal concerns.  Short Term Goals: Engage patient in aftercare planning with referrals and resources, Increase social support, Increase emotional regulation, Facilitate acceptance of mental health diagnosis and concerns, Identify triggers associated with mental health/substance abuse issues, and Increase skills for wellness and recovery  Therapeutic Interventions: Assess for all discharge needs, 1 to 1 time with Social worker, Explore available resources and support systems, Assess for adequacy in community support network, Educate family and significant other(s) on suicide prevention, Complete Psychosocial Assessment, Interpersonal group therapy.  Evaluation of Outcomes: Not Met   Progress in Treatment: Attending groups: Yes. Participating in groups: Yes. Taking medication as prescribed: Yes. Toleration medication: Yes. Family/Significant other contact made: Yes, individual(s) contacted:  Boyfriend  Patient understands diagnosis:  Yes. Discussing patient identified problems/goals with staff: Yes. Medical problems stabilized or resolved: Yes. Denies suicidal/homicidal ideation: Yes. Issues/concerns per patient self-inventory: No.   New problem(s) identified: No, Describe:  None   New Short Term/Long Term Goal(s): medication stabilization, elimination of SI thoughts, development of comprehensive mental wellness plan.   Patient Goals: "To get outpatient follow-up"   Discharge Plan or Barriers: Patient recently admitted. CSW will continue to follow and assess for appropriate referrals and possible discharge planning.   Reason for Continuation of Hospitalization: Depression Medication stabilization Suicidal ideation Withdrawal symptoms  Estimated Length of Stay: 3 to 5 days    Scribe for Treatment Team: Darleen Crocker, Latanya Presser 08/03/2021 2:16 PM

## 2021-08-03 NOTE — Group Note (Signed)
Date:  08/03/2021 Time:  9:45 AM  Group Topic/Focus:  Orientation:   The focus of this group is to educate the patient on the purpose and policies of crisis stabilization and provide a format to answer questions about their admission.  The group details unit policies and expectations of patients while admitted.    Participation Level:  Active  Participation Quality:  Appropriate  Affect:  Appropriate  Cognitive:  Appropriate  Insight: Appropriate  Engagement in Group:  Engaged  Modes of Intervention:  Discussion  Additional Comments:    Jerrye Beavers 08/03/2021, 9:45 AM

## 2021-08-03 NOTE — Progress Notes (Signed)
Patient did attend the last 15 minutes of the evening NA meeting.

## 2021-08-04 DIAGNOSIS — B3731 Acute candidiasis of vulva and vagina: Secondary | ICD-10-CM | POA: Diagnosis not present

## 2021-08-04 DIAGNOSIS — Z8619 Personal history of other infectious and parasitic diseases: Secondary | ICD-10-CM | POA: Diagnosis present

## 2021-08-04 LAB — T.PALLIDUM AB, TOTAL: T Pallidum Abs: REACTIVE — AB

## 2021-08-04 MED ORDER — NICOTINE 14 MG/24HR TD PT24: 14.0000 mg | MEDICATED_PATCH | Freq: Every day | TRANSDERMAL | Status: DC | PRN

## 2021-08-04 MED ORDER — OXCARBAZEPINE 150 MG PO TABS
150.0000 mg | ORAL_TABLET | Freq: Two times a day (BID) | ORAL | Status: DC
  Administered 2021-08-05 – 2021-08-07 (×5): 150 mg via ORAL
  Filled 2021-08-04 (×8): qty 1

## 2021-08-04 MED ORDER — TRAMADOL HCL 50 MG PO TABS
50.0000 mg | ORAL_TABLET | Freq: Four times a day (QID) | ORAL | Status: DC | PRN
  Administered 2021-08-04: 50 mg via ORAL
  Filled 2021-08-04: qty 1

## 2021-08-04 MED ORDER — BENZOCAINE 10 % MT GEL
Freq: Four times a day (QID) | OROMUCOSAL | Status: DC | PRN
  Administered 2021-08-04 – 2021-08-05 (×2): 1 via OROMUCOSAL
  Filled 2021-08-04: qty 9

## 2021-08-04 MED ORDER — FLUCONAZOLE 150 MG PO TABS
150.0000 mg | ORAL_TABLET | Freq: Every day | ORAL | Status: DC
  Administered 2021-08-04 – 2021-08-05 (×2): 150 mg via ORAL
  Filled 2021-08-04 (×5): qty 1

## 2021-08-04 MED ORDER — PSYLLIUM 95 % PO PACK
1.0000 | PACK | Freq: Every day | ORAL | Status: DC
  Administered 2021-08-04 – 2021-08-06 (×2): 1 via ORAL
  Filled 2021-08-04 (×5): qty 1

## 2021-08-04 MED ORDER — WHITE PETROLATUM EX OINT
TOPICAL_OINTMENT | CUTANEOUS | Status: AC
  Filled 2021-08-04: qty 5

## 2021-08-04 NOTE — Plan of Care (Signed)
°  Problem: Education: °Goal: Emotional status will improve °Outcome: Progressing °Goal: Mental status will improve °Outcome: Progressing °  °Problem: Activity: °Goal: Interest or engagement in activities will improve °Outcome: Progressing °  °

## 2021-08-04 NOTE — Progress Notes (Signed)
°   08/04/21 0810  Psych Admission Type (Psych Patients Only)  Admission Status Voluntary  Psychosocial Assessment  Patient Complaints Sleep disturbance  Eye Contact Fair  Facial Expression Flat  Affect Flat  Speech Logical/coherent  Interaction Minimal  Motor Activity Slow  Appearance/Hygiene Unremarkable  Behavior Characteristics Cooperative  Mood Euthymic  Thought Process  Coherency WDL  Content WDL  Delusions None reported or observed  Perception WDL  Hallucination None reported or observed  Judgment Poor  Confusion None  Danger to Self  Current suicidal ideation? Denies  Danger to Others  Danger to Others None reported or observed

## 2021-08-04 NOTE — Progress Notes (Signed)
° ° °   08/04/21 2106  Psych Admission Type (Psych Patients Only)  Admission Status Voluntary  Psychosocial Assessment  Patient Complaints Anxiety  Eye Contact Fair  Facial Expression Flat  Affect Flat  Speech Logical/coherent  Interaction Minimal  Motor Activity Slow  Appearance/Hygiene Unremarkable  Behavior Characteristics Cooperative  Mood Anxious  Thought Process  Coherency WDL  Content WDL  Delusions None reported or observed  Perception WDL  Hallucination None reported or observed  Judgment Poor  Confusion None  Danger to Self  Current suicidal ideation? Denies  Danger to Others  Danger to Others None reported or observed

## 2021-08-04 NOTE — Progress Notes (Signed)
[facility] MD Progress Note  08/04/2021 2:45 PM [name]  MRN: [id number] CC: SI  Subjective: [name] is a 51 y.o. female with PPHx of MDD, GAD, cocaine abuse, cannabis abuse, alcohol abuse, 2 prior admission to [facility], who presented to [facility] ED for active SI with plan to cut her wrist and take some pills then admitted Voluntary to [facility] for treatment of MDD exacerbated by recent cocaine 2 days ago and ultimatum with current boyfriend to either quit using cocaine where she cannot live with them anymore.  Patient reported "medicines not working anymore" however she was discontinue her psychotropics after about a month of adherence while using cocaine.  [place] Day 3    Interim History: Patient was evaluated today at interview room on 300 hall.  Patient's affect is brighter today, although still appeared anxious and fatigued.  She was cooperative, assertive and pleasant on exam.  Patient reported that her sleep has improved some, however she is still really sleepy during the day.  She reported still having low energy although improved since yesterday.  Patient reported new vaginal itching and dysuria along with cottage cheese appearing discharge.  Also discussed patient history of syphilis, that she was treated in her 67s.  However reported there is a chance that she could have syphilis currently.  She reported that we do not discuss this with her friend Thayer Jew, she will do it herself.  Discussed with her about switching Abilify to Trileptal, given she requested a mood stabilizer as this worked well for her in the past.  Patient reported tolerating current medications well, denied any side effects.  Discussed with her residential rehabilitation, patient appeared uninterested, although reported that she will think about it. Patient denied SI/HI/AVH, delusions, paranoia, first rank symptoms, and contracted to safety on the unit. Patient was not grossly responding to internal/external stimuli  nor made any delusional statements during encounter.   Diagnosis:  Principal Problem:   MDD (major depressive disorder), recurrent episode, severe (HCC) Active Problems:   PTSD (post-traumatic stress disorder)   Polysubstance abuse (HCC)   GAD (generalized anxiety disorder)   Cocaine dependence (HCC)   Alcohol use disorder, moderate, dependence ([place])   Benign essential HTN   Cannabis use disorder, moderate, dependence ([place])   Tobacco use disorder   History of syphilis   Vaginal candidiasis   Past Psychiatric History: See H&P  Past Medical History:  Past Medical History:  Diagnosis Date   Acid reflux    Anxiety    Asthma    Drug abuse ([place])    ETOH abuse    Hypertension    Manic-depressive disorder ([place])    PTSD (post-traumatic stress disorder)     Past Surgical History:  Procedure Laterality Date   TUBAL LIGATION     Family History:  Family History  Problem Relation Age of Onset   Diabetes Mother    Hypertension Mother    Family Psychiatric  History: See H&P Social History:  Social History   Substance and Sexual Activity  Alcohol Use Yes     Social History   Substance and Sexual Activity  Drug Use Yes   Types: Cocaine   Comment: crack    Social History   Socioeconomic History   Marital status: Divorced    Spouse name: Not on file   Number of children: Not on file   Years of education: Not on file   Highest education level: Not on file  Occupational History  Not on file  Tobacco Use   Smoking status: Every Day    Packs/day: 0.50    Years: 29.00    Pack years: 14.50    Types: Cigarettes   Smokeless tobacco: Never   Tobacco comments:    in rehab  Vaping Use   Vaping Use: Never used  Substance and Sexual Activity   Alcohol use: Yes   Drug use: Yes    Types: Cocaine    Comment: crack   Sexual activity: Yes    Birth control/protection: None  Other Topics Concern   Not on file  Social History Narrative   Not on file   Social  Determinants of Health   Financial Resource Strain: Not on file  Food Insecurity: Not on file  Transportation Needs: Not on file  Physical Activity: Not on file  Stress: Not on file  Social Connections: Not on file   Additional Social History:                         Sleep: Per above  Appetite:  Per above  Current Medications: Current Facility-Administered Medications  Medication Dose Route Frequency Provider Last Rate Last Admin   acetaminophen (TYLENOL) tablet 650 mg  650 mg Oral Q6H PRN [name], FNP   650 mg at 08/03/21 0827   albuterol (VENTOLIN HFA) 108 (90 Base) MCG/ACT inhaler 1-2 puff  1-2 puff Inhalation Q6H PRN [name], FNP   1 puff at 08/02/21 0819   alum & mag hydroxide-simeth (MAALOX/MYLANTA) 200-200-20 MG/5ML suspension 30 mL  30 mL Oral Q4H PRN [name], FNP       amLODipine (NORVASC) tablet 5 mg  5 mg Oral Daily [name], DO   5 mg at 08/04/21 0818   clindamycin (CLEOCIN) capsule 300 mg  300 mg Oral TID [name], DO   300 mg at 08/04/21 1305   fluconazole (DIFLUCAN) tablet 150 mg  150 mg Oral Daily [name], DO       hydrOXYzine (ATARAX) tablet 25 mg  25 mg Oral Q6H PRN [name], DO       lactose free nutrition (BOOST PLUS) liquid 237 mL  237 mL Oral TID WC [name], DO   237 mL at 08/04/21 1133   loperamide (IMODIUM) capsule 2-4 mg  2-4 mg Oral PRN [name], DO       LORazepam (ATIVAN) tablet 1 mg  1 mg Oral Q6H PRN [name], DO       OLANZapine zydis (ZYPREXA) disintegrating tablet 5 mg  5 mg Oral Q8H PRN [name], DO       And   LORazepam (ATIVAN) tablet 1 mg  1 mg Oral PRN [name], DO       And   ziprasidone (GEODON) injection 20 mg  20 mg Intramuscular PRN [name], DO       magnesium hydroxide (MILK OF MAGNESIA) suspension 30 mL  30 mL Oral Daily PRN [name], FNP       melatonin tablet 3 mg  3 mg Oral QHS [name], DO   3 mg at 08/03/21  2104   multivitamin with minerals tablet 1 tablet  1 tablet Oral Daily [name], DO   1 tablet at 08/04/21 0819   nicotine (NICODERM CQ - dosed in mg/24 hours) patch 14 mg  14 mg Transdermal Daily PRN [name], DO       ondansetron (ZOFRAN-ODT) disintegrating  tablet 4 mg  4 mg Oral Q6H PRN [name], DO       [START ON 08/05/2021] OXcarbazepine (TRILEPTAL) tablet 150 mg  150 mg Oral BID [name], DO       psyllium (HYDROCIL/METAMUCIL) 1 packet  1 packet Oral Daily [name], DO       sertraline (ZOLOFT) tablet 75 mg  75 mg Oral Daily [name], DO   75 mg at 08/04/21 2778   thiamine tablet 100 mg  100 mg Oral Daily [name], DO   100 mg at 08/04/21 2423   traZODone (DESYREL) tablet 100 mg  100 mg Oral QHS PRN [name], FNP   100 mg at 08/03/21 2104    Lab Results:  Results for orders placed or performed during the hospital encounter of 08/01/21 (from the past 48 hour(s))  RPR     Status: Abnormal   Collection Time: 08/02/21  6:26 PM  Result Value Ref Range   RPR Ser Ql Reactive (A) NON REACTIVE    Comment: SENT FOR CONFIRMATION   RPR Titer 1:2     Comment: Performed at [facility], [address] [place], [place] [address]  HIV Antibody (routine testing w rflx)     Status: None   Collection Time: 08/03/21  6:29 AM  Result Value Ref Range   HIV Screen 4th Generation wRfx Non Reactive Non Reactive    Comment: Performed at [facility], [place] [address], [place], [place] [address]    Blood Alcohol level:  Lab Results  Component Value Date   ETH <10 08/01/2021   ETH <10 00/86/7619    Metabolic Disorder Labs: Lab Results  Component Value Date   HGBA1C 5.4 08/02/2021   MPG 108.28 08/02/2021   MPG 131.24 05/04/2021   No results found for: PROLACTIN Lab Results  Component Value Date   CHOL 193 08/02/2021   TRIG 128 08/02/2021   HDL 69 08/02/2021   CHOLHDL 2.8 08/02/2021   VLDL 26 08/02/2021   LDLCALC 98 08/02/2021    LDLCALC 97 05/04/2021    Physical Findings:  Musculoskeletal: Strength & Muscle Tone: within normal limits Gait & Station: normal Patient leans: N/A  Psychiatric Specialty Exam: Physical Exam Vitals and nursing note reviewed.  Constitutional:      General: She is awake. She is not in acute distress.    Appearance: She is ill-appearing. She is not diaphoretic.     Comments: Sleepy  HENT:     Head: Normocephalic and atraumatic.  Pulmonary:     Effort: Pulmonary effort is normal. No respiratory distress.  Neurological:     General: No focal deficit present.     Mental Status: She is alert.  Psychiatric:        Behavior: Behavior is cooperative.    Review of Systems  Constitutional:  Positive for fatigue.  Respiratory:  Negative for shortness of breath.   Cardiovascular:  Negative for chest pain and palpitations.  Gastrointestinal:  Negative for blood in stool, constipation, diarrhea and nausea.  Genitourinary:  Positive for dysuria, vaginal discharge and vaginal pain. Negative for difficulty urinating, hematuria and vaginal bleeding.   Body mass index is 20.16 kg/m. Temp:  [98.3 F (36.8 C)-98.4 F (36.9 C)] 98.4 F (36.9 C) (02/09 0622) Pulse Rate:  [83-106] 106 (02/09 0622) BP: (119-144)/(82-90) 121/82 (02/09 0622) SpO2:  [98 %-100 %] 98 % (02/09 0622)  General Appearance:  Appropriate for Environment; Casual; Fairly Groomed  Eye Contact:   Good    Speech:   Clear and Coherent; Normal Rate    Volume:   Normal    Mood:   Depressed; Anxious    Affect:   Appropriate; Constricted; Congruent    Thought Process:   Coherent; Goal Directed; Linear   Descriptions of Associations: Intact  Duration of Psychotic Symptoms: N/A Past Diagnosis of Schizophrenia or Psychoactive disorder:  No   Orientation:   Full (Time, Place and Person)    Thought Content:   Rumination (Ruminated on how Thayer Jew make her angry.)  Hallucinations: None  Ideas of  reference: None    Suicidal Thoughts:   No    Homicidal Thoughts:   No    Memory:   Immediate Good; Recent Fair; Remote Fair    Judgement:   Fair    Insight:   Shallow    Psychomotor Activity:   Normal    Concentration:   Good    Attention Span:   Good   Recall:   Fair    Fund of Knowledge:   Fair    Language:   Good    Akathisia:  NA  Handed:   Right    Assets:   Communication Skills; Desire for Improvement; Housing; Intimacy; Social Support    ADL's:  Intact  Cognition:  WNL  Sleep:   Sleep: Fair  Number of Hours: 8  AIMS:  , ,  ,  ,     CIWA:  CIWA-Ar Total: 0 COWS:  COWS Total Score: 1       Treatment Plan & Assessment Summary: Principal Problem:   MDD (major depressive disorder), recurrent episode, severe (HCC) Active Problems:   PTSD (post-traumatic stress disorder)   Polysubstance abuse (HCC)   GAD (generalized anxiety disorder)   Cocaine dependence ([place])   Alcohol use disorder, moderate, dependence ([place])   Benign essential HTN   Cannabis use disorder, moderate, dependence ([place])   Tobacco use disorder   History of syphilis   Vaginal candidiasis   [name] is a 51 y.o. female with PPHx of MDD, GAD, cocaine abuse, cannabis abuse, alcohol abuse, 2 prior admission to [facility], who presented to [facility] ED for active SI with plan to cut her wrist and take some pills then admitted Voluntary to [facility] for treatment of MDD exacerbated by recent cocaine 2 days ago and ultimatum with current boyfriend to either quit using cocaine where she cannot live with them anymore.  Patient reported "medicines not working anymore" however she was discontinue her psychotropics after about a month of adherence while using cocaine.  [place] Day 3   PLAN: MDD, GAD, improving Patient still has poor energy, poor sleep, anxious and depressed.  She brought concerns of mood lability at home, that cause conflict personal conflict, requested a mood  stabilizer, she does not remember which one that worked well for her in the past.  She was amenable to discontinuing Abilify Discontinued home Abilify 5 mg daily Continued Zoloft 75 mg daily - will consider up titration depending on sxs and tolerability  Continued boost for poor appetite We will start Trileptal 150 mg twice daily (2/10)   PTSD Patient reported symptoms of intrusive recurrent memories that are distressing the result and anxiety attacks.  She also has symptoms of hypervigilance and exaggerated startle response along with irritability. Continued Zoloft per above   PUD (cocaine, EtOH, cannabis, nicotine) Patient reported daily cocaine use, which was confirmed by collateral. She denied IVDU  ever. Encouraged cessation Patient is amenable to outpatient therapy and possibly residential rehabilitation as she has in the past Continued Nicotine replacement therapy Continued  CIWA with ativan per protocol (CIWA >10) Continued COWS We will inquire with CSW follow-up options for patient   H/o syphilis RPR reactive.  Reported history of syphilis back in her 37s, that was treated.  Patient stated that there could still be a chance that she is reinfected.  She denied allergies to penicillin.  She reported that this often does not tell Thayer Jew, that she will do it herself.  We will plan to follow up with that. Pending syphilis antibodies to determine active infection or not If active, we will administer penicillin G and have patient follow up with PCP or health Department for remainder treatment and inform state  Health maintenance Given substance abuse, high risk activities, poor outpatient follow-up, and lesion on her lip that started 4 days ago.  Patient was amenable to the following work-up. HIV nonreactive GC chlamydia - Pending   Vaginal candidiasis Patient reported dysuria, vaginal itching, cottage cheese discharge that is new Started Diflucan 150 mg x1  Dental/gum  pain/infection, improving Patient was started on clindamycin in the ED, we will continue this. Continued clindamycin 300 mg TID x7 days (end 2/14)   Hypertension, stable 2/2 chronic cocaine abuse. Plan to titrate up amlodipine every 2-3 days to avoid hypotension. Continued amlodipine 5 mg daily Will consider clonidine PRN if patient's BP increases   Dispo: PCP, one-on-one therapy, and med management services have all been set up for follow-up Location: back home Family meeting with Thayer Jew Tuesday, February 7 at 2 PM  Safety, monitoring and disposition planning: The patient was seen and evaluated on the unit.  The patient's chart was reviewed and nursing notes were reviewed.  The patient's case was discussed in multidisciplinary team meeting.  Plan and drug side effects were discussed with patient who was amendable. Social work and case management to assist with discharge planning and identification of hospital follow-up needs prior to discharge. Discharge Concerns: Need to establish a safety plan; Medication compliance and effectiveness Discharge Goals: Return home with outpatient referrals for mental health follow-up including medication management/psychotherapy Safety and Monitoring: Voluntary status at inpatient psychiatric unit for safety, stabilization and treatment Daily contact with patient to assess and evaluate symptoms and progress in treatment and medical management Patient's case to be discussed in multi-disciplinary team meeting Observation Level: Per above Vital signs:  q12 hours Precautions: suicide   Signed: [name], DO Psychiatry Resident, PGY-1 [facility] 08/04/2021, 2:45 PM

## 2021-08-04 NOTE — Progress Notes (Signed)
Please see Downtime BH safety checks from 08/04/2021 @ 0130 - 08/04/2021 @ 0615.

## 2021-08-04 NOTE — BHH Group Notes (Signed)
Patient did not attend the relaxation group. 

## 2021-08-04 NOTE — Progress Notes (Signed)
[place] Group Notes:  (Nursing/MHT/Case Management/Adjunct)  Date:  08/04/2021  Time:  2015 Type of Therapy:   wrap up group  Participation Level:  Active  Participation Quality:  Appropriate, Attentive, Sharing, and Supportive  Affect:  Appropriate  Cognitive:  Alert  Insight:  Improving  Engagement in Group:  Engaged  Modes of Intervention:  Clarification, Education, and Support  Summary of Progress/Problems: Positive thinking and positive change were discussed.   Winfield Rast S 08/04/2021, 9:22 PM

## 2021-08-04 NOTE — Plan of Care (Signed)
°  Problem: Activity: Goal: Interest or engagement in activities will improve Outcome: Not Progressing   Problem: Coping: Goal: Ability to verbalize frustrations and anger appropriately will improve Outcome: Progressing   Problem: Coping: Goal: Ability to demonstrate self-control will improve Outcome: Progressing   Problem: Safety: Goal: Periods of time without injury will increase Outcome: Progressing

## 2021-08-04 NOTE — Group Note (Signed)
Date:  08/04/2021 Time:  10:45 AM  Group Topic/Focus:  Orientation:   The focus of this group is to educate the patient on the purpose and policies of crisis stabilization and provide a format to answer questions about their admission.  The group details unit policies and expectations of patients while admitted.    Participation Level:  Minimal  Participation Quality:  Inattentive  Affect:  Blunted  Cognitive:  Lacking  Insight: Limited  Engagement in Group:  Lacking  Modes of Intervention:  Discussion  Additional Comments:    Jerrye Beavers 08/04/2021, 10:45 AM

## 2021-08-04 NOTE — Progress Notes (Signed)
° ° °   08/04/21 1958  Vital Signs  Pulse Rate 95  Pulse Rate Source Monitor  BP (!) 126/106  BP Method Automatic  Patient Position (if appropriate) Standing  Oxygen Therapy  SpO2 99 %       08/04/21 2002  Vital Signs  Pulse Rate 95  Pulse Rate Source Monitor  BP (!) 143/85  BP Location Right Arm  BP Method Automatic  Patient Position (if appropriate) Sitting  Oxygen Therapy  SpO2 99 %    Pt denies blurred vision, lightheadedness, or headache. Pt states, "my blood pressure always runs high."  Pt reports anxious and having toothache pain 10/10.  Provider made aware; Request made for additional pain medication.  Administered PRN Hydroxyzine per MAR per pt request.

## 2021-08-05 ENCOUNTER — Telehealth: Payer: Self-pay | Admitting: Internal Medicine

## 2021-08-05 MED ORDER — WHITE PETROLATUM EX OINT
TOPICAL_OINTMENT | CUTANEOUS | Status: AC
  Administered 2021-08-05: 1
  Filled 2021-08-05: qty 5

## 2021-08-05 NOTE — Group Note (Signed)
Recreation Therapy Group Note   Group Topic:Team Building  Group Date: 08/05/2021 Start Time: 0930 End Time: 0955 Facilitators: Victorino Sparrow, LRT,CTRS Location: [address] Hall Dayroom   Goal Area(s) Addresses:  Patient will effectively work with peer towards shared goal.  Patient will identify skills used to make activity successful.  Patient will identify how skills used during activity can be applied to reach post d/c goals.   Group Description: [employer]. In teams of 5-6, patients were given 12 craft pipe cleaners. Using the materials provided, patients were instructed to compete again the opposing team(s) to build the tallest free-standing structure from floor level. The activity was timed; difficulty increased by Probation officer as Pharmacist, hospital continued.  Systematically resources were removed with additional directions for example, placing one arm behind their back, working in silence, and shape stipulations.    Affect/Mood: N/A   Participation Level: Did not attend    Clinical Observations/Individualized Feedback:     Plan: Continue to engage patient in RT group sessions 2-3x/week.   Victorino Sparrow, Glennis Brink 08/05/2021 12:27 PM

## 2021-08-05 NOTE — Telephone Encounter (Signed)
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 State lab was called and interventions below: 1997-RPR 1:64 SP bicillin x2 10/21 and 04/21/1996 11/11/20-RPR 1:4  Plan: Pt planned for discharge on Sunday To triage: :-please schedule with ID clinic next week for assessment and treatment. As pt received 2/3 doses of bicillin in 1997 she may need bicillin x3 at the very least(unless treated outside of [place])vs PEN x 14 d for otic syphilis.

## 2021-08-05 NOTE — Group Note (Signed)
Date:  08/05/2021 Time:  10:46 AM  Group Topic/Focus:  Orientation:   The focus of this group is to educate the patient on the purpose and policies of crisis stabilization and provide a format to answer questions about their admission.  The group details unit policies and expectations of patients while admitted.    Participation Level:  Did Not Attend  Participation Quality:    Affect:    Cognitive:    Insight:   Engagement in Group:    Modes of Intervention:    Additional Comments: Was invited to attend but did not.  Jerrye Beavers 08/05/2021, 10:46 AM
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
## 2021-08-05 NOTE — Group Note (Signed)
LCSW Group Therapy Note   Group Date: 08/05/2021 Start Time: 1300 End Time: 1400   Type of Therapy and Topic:  Group Therapy:  Strengths Exploration   Participation Level: Active  Description of Group: This group allows individuals to explore their strengths, learn to use strengths in new ways to improve well-being. Strengths-based interventions involve identifying strengths, understanding how they are used, and learning new ways to apply them. Individuals will identify their strengths, and then explore their roles in different areas of life (relationships, professional life, and personal fulfillment). Individuals will think about ways in which they currently use their strengths, along with new ways they could begin using them.    Therapeutic Goals Patient will verbalize two of their strengths Patient will identify how their strengths are currently used Patient will identify two new ways to apply their strengths  Patients will create a plan to apply their strengths in their daily lives     Summary of Patient Progress:  Pt attended group and remained there the entire time.  The Pt accepted the worksheet and followed along, filling in the worksheet, participating, and following directions.  The Pt demonstrated understanding of the topic and showed respect for their peers during the group discussion.     Therapeutic Modalities Cognitive Behavioral Therapy Motivational Interviewing  Darleen Crocker, [place] 08/05/2021  2:19 PM

## 2021-08-05 NOTE — Progress Notes (Signed)
°  [facility] Adult Case Management Discharge Plan :  Will you be returning to the same living situation after discharge:  No, patient being discharged to ED to get treatment for medical issues At discharge, do you have transportation home?: Yes,  patient will be provided discharge to the ED.  Do you have the ability to pay for your medications: Yes,  insurance  Release of information consent forms completed and in the chart;  Patient's signature needed at discharge.  Patient to Follow up at:  Follow-up Information     Primary Care at [facility]. Go on 09/16/2021.   Specialty: Family Medicine Why: You have a hospital follow up appointment for primary care services on 09/16/21 at 8:00 am with Dr. Durene Fruits.  This appointment will be held in person.  It is important that you attend this appointment. Contact information: [address], [address] [place] [facility]. Go on 08/17/2021.   Why: You have an appointment for therapy services on 08/17/21 at 4:00 pm.  This appointment will be held in person. Contact information: [place] [place], [place] [address]  Phone: [phone]        [place]. Go on 08/25/2021.   Why: You have an appointment for medication management services on 08/25/21 at 2:00 pm.  This appointment will be held in person. Contact information: [place] [address] [phone]                 Next level of care provider has access to [place] and Suicide Prevention discussed: Yes,  partner     Has patient been referred to the Quitline?: Patient refused referral, states that she is not ready to quit cigarettes on top of quitting crack/cocaine  Patient has been referred for addiction treatment: Yes  Dawn Treanor E Shrihaan Porzio, LCSW 08/05/2021, 10:52 AM

## 2021-08-05 NOTE — Progress Notes (Signed)
Patient ID: [name], female   DOB: March 30, 1971, 51 y.o.   MRN: [id number]  Patient did not attend group.

## 2021-08-06 DIAGNOSIS — F411 Generalized anxiety disorder: Secondary | ICD-10-CM

## 2021-08-06 DIAGNOSIS — F4310 Post-traumatic stress disorder, unspecified: Secondary | ICD-10-CM

## 2021-08-06 DIAGNOSIS — F1420 Cocaine dependence, uncomplicated: Secondary | ICD-10-CM

## 2021-08-06 DIAGNOSIS — F332 Major depressive disorder, recurrent severe without psychotic features: Principal | ICD-10-CM

## 2021-08-06 MED ORDER — NICOTINE 14 MG/24HR TD PT24: 14.0000 mg | MEDICATED_PATCH | Freq: Every day | TRANSDERMAL | 0 refills | Status: DC | PRN

## 2021-08-06 MED ORDER — WHITE PETROLATUM EX OINT
TOPICAL_OINTMENT | CUTANEOUS | Status: AC
  Filled 2021-08-06: qty 5

## 2021-08-06 MED ORDER — AMLODIPINE BESYLATE 5 MG PO TABS: 5.0000 mg | ORAL_TABLET | Freq: Every day | ORAL | 0 refills | Status: DC

## 2021-08-06 MED ORDER — SERTRALINE HCL 25 MG PO TABS
75.0000 mg | ORAL_TABLET | Freq: Every day | ORAL | 0 refills | Status: DC
Start: 2021-08-07 — End: 2021-08-07

## 2021-08-06 MED ORDER — THIAMINE HCL 100 MG PO TABS: 100.0000 mg | ORAL_TABLET | Freq: Every day | ORAL | 0 refills | Status: DC

## 2021-08-06 MED ORDER — MELATONIN 3 MG PO TABS: 3.0000 mg | ORAL_TABLET | Freq: Every day | ORAL | 0 refills | Status: DC

## 2021-08-06 MED ORDER — CLINDAMYCIN HCL 150 MG PO CAPS: 300.0000 mg | ORAL_CAPSULE | Freq: Three times a day (TID) | ORAL | 0 refills | Status: AC

## 2021-08-06 MED ORDER — OXCARBAZEPINE 150 MG PO TABS: 150.0000 mg | ORAL_TABLET | Freq: Two times a day (BID) | ORAL | 0 refills | Status: DC

## 2021-08-06 MED ORDER — HYDROXYZINE HCL 25 MG PO TABS
25.0000 mg | ORAL_TABLET | Freq: Three times a day (TID) | ORAL | Status: DC | PRN
  Administered 2021-08-06: 25 mg via ORAL
  Filled 2021-08-06: qty 1

## 2021-08-06 NOTE — Progress Notes (Signed)
[facility] MD Progress Note  08/06/2021 4:48 PM [name]  MRN: [id number] CC: SI  Subjective: [name] is a 51 y.o. female with PPHx of MDD, GAD, cocaine abuse, cannabis abuse, alcohol abuse, 2 prior admission to [facility], who presented to [facility] for active SI with plan to cut her wrist and take some pills then admitted Voluntary to [facility] for treatment of MDD exacerbated by recent cocaine 2 days ago and ultimatum with current boyfriend to either quit using cocaine where she cannot live with them anymore.  Patient reported "medicines not working anymore" however she was discontinue her psychotropics after about a month of adherence while using cocaine.  [place] Day 5    Interim History: Patient was evaluated in interview room on Hall [address] with attending. Patient was pleasant, cooperative, and engaging during conversation.  Patient reported stable mood, appetite, sleep.  She denied any side effects to her medications.  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 Patient denied SI/HI/AVH, delusions, paranoia, first rank symptoms, and contracted to safety on the unit. Patient was not grossly responding to internal/external stimuli nor made any delusional statements during encounter.  Patient had no other concerns.  Diagnosis:  Principal Problem:   MDD (major depressive disorder), recurrent episode, severe (HCC) Active Problems:   PTSD (post-traumatic stress disorder)   Polysubstance abuse (HCC)   GAD (generalized anxiety disorder)   Cocaine dependence (HCC)   Alcohol use disorder, moderate, dependence ([place])   Benign essential HTN   Cannabis use disorder, moderate, dependence ([place])   Tobacco use disorder   History of syphilis   Vaginal candidiasis   Past Psychiatric History: See H&P  Past Medical History:  Past Medical History:  Diagnosis Date   Acid reflux    Anxiety    Asthma    Drug abuse ([place])    ETOH  abuse    Hypertension    Manic-depressive disorder ([place])    PTSD (post-traumatic stress disorder)     Past Surgical History:  Procedure Laterality Date   TUBAL LIGATION     Family History:  Family History  Problem Relation Age of Onset   Diabetes Mother    Hypertension Mother    Family Psychiatric  History: See H&P Social History:  Social History   Substance and Sexual Activity  Alcohol Use Yes     Social History   Substance and Sexual Activity  Drug Use Yes   Types: Cocaine   Comment: crack    Social History   Socioeconomic History   Marital status: Divorced    Spouse name: Not on file   Number of children: Not on file   Years of education: Not on file   Highest education level: Not on file  Occupational History   Not on file  Tobacco Use   Smoking status: Every Day    Packs/day: 0.50    Years: 29.00    Pack years: 14.50    Types: Cigarettes   Smokeless tobacco: Never   Tobacco comments:    in rehab  Vaping Use   Vaping Use: Never used  Substance and Sexual Activity   Alcohol use: Yes   Drug use: Yes    Types: Cocaine    Comment: crack   Sexual activity: Yes    Birth control/protection: None  Other Topics Concern   Not on file  Social History Narrative   Not on file   Social Determinants  of Health   Financial Resource Strain: Not on file  Food Insecurity: Not on file  Transportation Needs: Not on file  Physical Activity: Not on file  Stress: Not on file  Social Connections: Not on file   Additional Social History:                         Sleep: Per above  Appetite:  Per above  Current Medications: Current Facility-Administered Medications  Medication Dose Route Frequency Provider Last Rate Last Admin   acetaminophen (TYLENOL) tablet 650 mg  650 mg Oral Q6H PRN [name], FNP   650 mg at 08/05/21 1819   albuterol (VENTOLIN HFA) 108 (90 Base) MCG/ACT inhaler 1-2 puff  1-2 puff Inhalation Q6H PRN [name], FNP   2 puff at 08/06/21 1403   alum & mag hydroxide-simeth (MAALOX/MYLANTA) 200-200-20 MG/5ML suspension 30 mL  30 mL Oral Q4H PRN [name], FNP       amLODipine (NORVASC) tablet 5 mg  5 mg Oral Daily [name], DO   5 mg at 08/06/21 0842   benzocaine (ORAJEL) 10 % mucosal gel   Mouth/Throat QID PRN Ethelene Hal, NP   1 application at 94/85/46 1253   clindamycin (CLEOCIN) capsule 300 mg  300 mg Oral TID [name], DO   300 mg at 08/06/21 1626   hydrOXYzine (ATARAX) tablet 25 mg  25 mg Oral TID PRN Nwoko, Uchenna E, PA   25 mg at 08/06/21 0640   lactose free nutrition (BOOST PLUS) liquid 237 mL  237 mL Oral TID WC [name], DO   237 mL at 08/06/21 1626   OLANZapine zydis (ZYPREXA) disintegrating tablet 5 mg  5 mg Oral Q8H PRN [name], DO       And   LORazepam (ATIVAN) tablet 1 mg  1 mg Oral PRN [name], DO       And   ziprasidone (GEODON) injection 20 mg  20 mg Intramuscular PRN [name], DO       magnesium hydroxide (MILK OF MAGNESIA) suspension 30 mL  30 mL Oral Daily PRN [name], FNP       melatonin tablet 3 mg  3 mg Oral QHS [name], DO   3 mg at 08/05/21 2214   multivitamin with minerals tablet 1 tablet  1 tablet Oral Daily [name], DO   1 tablet at 08/06/21 0843   nicotine (NICODERM CQ - dosed in mg/24 hours) patch 14 mg  14 mg Transdermal Daily PRN [name], DO       OXcarbazepine (TRILEPTAL) tablet 150 mg  150 mg Oral BID [name], DO   150 mg at 08/06/21 1626   psyllium (HYDROCIL/METAMUCIL) 1 packet  1 packet Oral Daily [name], DO   1 packet at 08/06/21 0843   sertraline (ZOLOFT) tablet 75 mg  75 mg Oral Daily [name], DO   75 mg at 08/06/21 2703   thiamine tablet 100 mg  100 mg Oral Daily [name], DO   100 mg at 08/06/21 0842   traMADol (ULTRAM) tablet 50 mg  50 mg Oral Q6H PRN Ethelene Hal, NP   50 mg at 08/04/21 2106   traZODone (DESYREL) tablet 100 mg  100  mg Oral QHS PRN [name], FNP   100 mg at 08/04/21 2107    Lab Results:  No results found for this or any previous visit (  from the past 48 hour(s)).   Blood Alcohol level:  Lab Results  Component Value Date   ETH <10 08/01/2021   ETH <10 66/59/9357    Metabolic Disorder Labs: Lab Results  Component Value Date   HGBA1C 5.4 08/02/2021   MPG 108.28 08/02/2021   MPG 131.24 05/04/2021   No results found for: PROLACTIN Lab Results  Component Value Date   CHOL 193 08/02/2021   TRIG 128 08/02/2021   HDL 69 08/02/2021   CHOLHDL 2.8 08/02/2021   VLDL 26 08/02/2021   LDLCALC 98 08/02/2021   LDLCALC 97 05/04/2021    Physical Findings:  Musculoskeletal: Strength & Muscle Tone: within normal limits Gait & Station: normal Patient leans: N/A  Psychiatric Specialty Exam: Physical Exam Vitals and nursing note reviewed.  Constitutional:      General: She is awake. She is not in acute distress.    Appearance: She is ill-appearing. She is not diaphoretic.     Comments: Sleepy  HENT:     Head: Normocephalic and atraumatic.  Pulmonary:     Effort: Pulmonary effort is normal. No respiratory distress.  Neurological:     General: No focal deficit present.     Mental Status: She is alert.  Psychiatric:        Behavior: Behavior is cooperative.    Review of Systems  Respiratory:  Negative for shortness of breath.   Cardiovascular:  Negative for chest pain and palpitations.  Gastrointestinal:  Negative for blood in stool, constipation, diarrhea and nausea.  Genitourinary:  Negative for difficulty urinating, hematuria and vaginal bleeding.   Body mass index is 20.16 kg/m. Temp:  [98.1 F (36.7 C)] 98.1 F (36.7 C) (02/11 0450) Pulse Rate:  [80-97] 80 (02/11 1621) Resp:  [16] 16 (02/11 1615) BP: (127-152)/(84-99) 143/88 (02/11 1621) SpO2:  [98 %-100 %] 100 % (02/11 1621)  General Appearance:  Appropriate for Environment; Casual; Fairly Groomed    Eye Contact:    Good    Speech:   Normal Rate    Volume:   Normal    Mood:   Euthymic    Affect:   Appropriate; Constricted    Thought Process:   Coherent; Goal Directed; Linear   Descriptions of Associations: Intact  Duration of Psychotic Symptoms: N/A Past Diagnosis of Schizophrenia or Psychoactive disorder:  No   Orientation:   Full (Time, Place and Person)    Thought Content:   Logical  Hallucinations: None  Ideas of reference: None    Suicidal Thoughts:   No    Homicidal Thoughts:   No    Memory:   Immediate Good; Recent Good    Judgement:   Fair    Insight:   Fair    Psychomotor Activity:   Normal    Concentration:   Good    Attention Span:   Good   Recall:   Good    Fund of Knowledge:   Good    Language:   Good    Akathisia:  NA  Handed:   Right    Assets:   Communication Skills; Desire for Improvement; Housing    ADL's:  Intact  Cognition:  WNL  Sleep:   Sleep: Fair  Number of Hours: 7.5  CIWA:  CIWA-Ar Total: 0 COWS:  COWS Total Score: 2       Treatment Plan & Assessment Summary: Principal Problem:   MDD (major depressive disorder), recurrent episode, severe (HCC) Active Problems:   PTSD (post-traumatic stress disorder)  Polysubstance abuse (HCC)   GAD (generalized anxiety disorder)   Cocaine dependence (HCC)   Alcohol use disorder, moderate, dependence (HCC)   Benign essential HTN   Cannabis use disorder, moderate, dependence (HCC)   Tobacco use disorder   History of syphilis   Vaginal candidiasis   [name] is a 51 y.o. female with PPHx of MDD, GAD, cocaine abuse, cannabis abuse, alcohol abuse, 2 prior admission to [facility], who presented to [facility] ED for active SI with plan to cut her wrist and take some pills then admitted Voluntary to [facility] for treatment of MDD exacerbated by recent cocaine 2 days ago and ultimatum with current boyfriend to either quit using cocaine where she cannot live with them  anymore.  Patient reported "medicines not working anymore" however she was discontinue her psychotropics after about a month of adherence while using cocaine.  [place] Day 5   PLAN: MDD, GAD Brighter affect, still some residual depression, expect this to improve with adherence to medication. Continued Zoloft 75 mg daily - will consider up titration depending on sxs and tolerability  Continued boost for poor appetite Continued Trileptal 150 mg twice daily for irritability   PTSD Patient reported symptoms of intrusive recurrent memories that are distressing the result and anxiety attacks.  She also has symptoms of hypervigilance and exaggerated startle response along with irritability. Continued Zoloft per above   PUD (cocaine, EtOH, cannabis, nicotine) Patient reported daily cocaine use, which was confirmed by collateral. She denied IVDU ever. Encouraged cessation Patient is amenable to outpatient therapy and possibly residential rehabilitation as she has in the past Continued Nicotine replacement therapy Continued  CIWA with ativan per protocol (CIWA >10) Continued COWS We will inquire with CSW follow-up options for patient   H/o syphilis RPR reactive.  Reported history of syphilis back in her 40s, that was treated.  Patient stated that there could still be a chance that she is reinfected.  She denied allergies to penicillin.  She reported that this often does not tell Thayer Jew, that she will do it herself.  We will plan to follow up with that. 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  Patient was amenable to the following work-up. HIV nonreactive GC chlamydia - Pending  Dental/gum pain/infection, improving Patient was  started on clindamycin in the ED, we will continue this. Continued clindamycin 300 mg TID x7 days (end 2/14)   Hypertension, stable 2/2 chronic cocaine abuse. Plan to titrate up amlodipine every 2-3 days to avoid hypotension. Continued amlodipine 5 mg daily Will consider clonidine PRN if patient's BP increases  Dispo: PCP, one-on-one therapy, and med management services have all been set up for follow-up Location: back home Outpatient ID on Monday 2/13  Safety, monitoring and disposition planning: The patient was seen and evaluated on the unit.  The patient's chart was reviewed and nursing notes were reviewed.  The patient's case was discussed in multidisciplinary team meeting.  Plan and drug side effects were discussed with patient who was amendable. Social work and case management to assist with discharge planning and identification of hospital follow-up needs prior to discharge. Discharge Concerns: Need to establish a safety plan; Medication compliance and effectiveness Discharge Goals: Return home with outpatient referrals for mental health follow-up including  medication management/psychotherapy Safety and Monitoring: Voluntary status at inpatient psychiatric unit for safety, stabilization and treatment Daily contact with patient to assess and evaluate symptoms and progress in treatment and medical management Patient's case to be discussed in multi-disciplinary team meeting Observation Level: Per above Vital signs:  q12 hours Precautions: suicide   Signed: [name], DO Psychiatry Resident, PGY-1 [facility] 08/06/2021, 4:48 PM

## 2021-08-06 NOTE — Progress Notes (Signed)
° ° °   08/05/21 2214  Psych Admission Type (Psych Patients Only)  Admission Status Voluntary  Psychosocial Assessment  Patient Complaints Anxiety  Eye Contact Fair  Facial Expression Flat  Affect Flat  Speech Logical/coherent  Interaction Minimal  Motor Activity Slow  Appearance/Hygiene Unremarkable  Behavior Characteristics Cooperative  Mood Anxious  Thought Process  Coherency WDL  Content WDL  Delusions None reported or observed  Perception WDL  Hallucination None reported or observed  Judgment Poor  Confusion None  Danger to Self  Current suicidal ideation? Denies  Danger to Others  Danger to Others None reported or observed

## 2021-08-06 NOTE — Group Note (Signed)
LCSW Group Therapy Note  08/06/2021    10:00-11:00am   Topic:  Anger Healthy and Unhealthy Coping Skills  Participation Level:  Active  Description of Group:   In this group, patients identified their own common triggers and typical reactions then analyzed how these reactions are possibly beneficial and possibly unhelpful.  Focus was placed on examining whether typical coping skills are healthy or unhealthy.  Therapeutic Goals: Patients will share situations that commonly incite their anger and how they typically respond Patients will identify how their coping skills work for them and/or against them Patients will explore possible alternative coping skills Patients will learn that anger itself is normal and that healthier reactions can assist with resolving conflict rather than worsening situations  Summary of Patient Progress:  The patient shared that her frequent cause of anger is being invalidated which can lead her to go into a rage ("from 0 to 1000").  Choice of coping skill is often to scream at the top of her lungs, which was identified as an unhealthy choice because others think poorly of her for doing this where they can here it everywhere.  She was very open to suggestions made and therapeutic interventions proposed.  Therapeutic Modalities:   Cognitive Behavioral Therapy Processing  [name]
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
## 2021-08-06 NOTE — Plan of Care (Signed)
°  Problem: Education: °Goal: Emotional status will improve °Outcome: Progressing °Goal: Mental status will improve °Outcome: Progressing °Goal: Verbalization of understanding the information provided will improve °Outcome: Progressing °  °

## 2021-08-06 NOTE — Progress Notes (Signed)
Adult Psychoeducational Group Note  Date:  08/06/2021 Time:  10:54 PM  Group Topic/Focus:  Wrap-Up Group:   The focus of this group is to help patients review their daily goal of treatment and discuss progress on daily workbooks.  Participation Level:  Active  Participation Quality:  Appropriate  Affect:  Appropriate  Cognitive:  Appropriate  Insight: Appropriate  Engagement in Group:  Engaged  Modes of Intervention:  Discussion  Additional Comments:  patient said she want the staff to be approachable.  Lenice Llamas Long 08/06/2021, 10:54 PM
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
## 2021-08-07 MED ORDER — SERTRALINE HCL 25 MG PO TABS
75.0000 mg | ORAL_TABLET | Freq: Every day | ORAL | Status: DC
  Filled 2021-08-07: qty 3

## 2021-08-07 MED ORDER — OXCARBAZEPINE 150 MG PO TABS: 150.0000 mg | ORAL_TABLET | Freq: Two times a day (BID) | ORAL | 0 refills | Status: DC

## 2021-08-07 MED ORDER — AMLODIPINE BESYLATE 5 MG PO TABS: 5.0000 mg | ORAL_TABLET | Freq: Every day | ORAL | 0 refills | Status: DC

## 2021-08-07 MED ORDER — SERTRALINE HCL 25 MG PO TABS
75.0000 mg | ORAL_TABLET | Freq: Every day | ORAL | 0 refills | Status: DC
Start: 2021-08-07 — End: 2023-04-13

## 2021-08-07 MED ORDER — THIAMINE HCL 100 MG PO TABS: 100.0000 mg | ORAL_TABLET | Freq: Every day | ORAL | 0 refills | Status: AC

## 2021-08-07 NOTE — Progress Notes (Signed)
Patient compliant with medication No adverse drug noted.  Support and encouragement provided. Routine safety checks conducted every 15 minutes. Patient notified to inform staff with problems or concerns.  Denies SI/HI/A/VH and verbally contracted for safety.v

## 2021-08-07 NOTE — Progress Notes (Signed)
D:  Patient denied SI and HI, contracts for safety.  Denied A/V hallucinations.  Denied pain. A:  Medications administered per MD orders.  Emotional support and encouragement given patient. R:  Safety maintained with 15 minute checks.  

## 2021-08-07 NOTE — Progress Notes (Signed)
°  [facility] Adult Case Management Discharge Plan :  Will you be returning to the same living situation after discharge:  Yes,  with significant other At discharge, do you have transportation home?: Yes,  significant other to pick up Do you have the ability to pay for your medications: Yes,  insurance  Release of information consent forms completed and emailed to Medical Records, then turned in to Medical Records by CSW.   Patient to Follow up at:  Follow-up Information     Primary Care at [facility]. Go on 09/16/2021.   Specialty: Family Medicine Why: You have a hospital follow up appointment for primary care services on 09/16/21 at 8:00 am with Dr. Durene Fruits.  This appointment will be held in person.  It is important that you attend this appointment. Contact information: [address] [facility]. Go on 08/17/2021.   Why: You have an appointment for therapy services on 08/17/21 at 4:00 pm.  This appointment will be held in person. Contact information: [address] [place], [place] [address]  Phone: [phone]        [place]. Go on 08/25/2021.   Why: You have an appointment for medication management services on 08/25/21 at 2:00 pm.  This appointment will be held in person. Contact information: [place] [address] [phone]                 Next level of care provider has access to [place] and Suicide Prevention discussed: Yes,  with boyfriend     Has patient been referred to the Quitline?: Patient refused referral  Patient has been referred for addiction treatment: Yes  Maretta Los, LCSW 08/07/2021, 9:04 AM

## 2021-08-07 NOTE — Progress Notes (Signed)
Discharge Note:  Patient discharged home with family member.  Suicide prevention information given and discussed with patient who stated she understood and had no questions.  Denied SI and HI.  Denied A/V hallucinations.  Patient stated she received all her belongings, clothing, toiletries, misc items, etc.  Patient stated she appreciated all assistance from [facility] staff.  All required discharge information given.

## 2021-08-07 NOTE — Plan of Care (Addendum)
Nurse discussed coping skills with patient.  Patient would not fill out discharge plan.

## 2021-08-08 LAB — GC/CHLAMYDIA PROBE AMP (~~LOC~~) NOT AT ARMC
Chlamydia: NEGATIVE
Comment: NEGATIVE
Comment: NORMAL
Neisseria Gonorrhea: NEGATIVE

## 2021-08-11 NOTE — Telephone Encounter (Signed)
Attempted to call patient to schedule and advise that if symptoms worsen she should go to the ED. Call could not be completed. Referral faxed to [facility] to aid in contacting patient for treatment.   Beryle Flock, RN

## 2021-09-11 NOTE — Progress Notes (Signed)
Erroneous encounter

## 2021-09-16 ENCOUNTER — Encounter: Payer: Medicare Other | Admitting: Family

## 2021-09-16 DIAGNOSIS — I10 Essential (primary) hypertension: Secondary | ICD-10-CM

## 2021-09-16 DIAGNOSIS — Z7689 Persons encountering health services in other specified circumstances: Secondary | ICD-10-CM

## 2021-09-16 DIAGNOSIS — F1020 Alcohol dependence, uncomplicated: Secondary | ICD-10-CM

## 2021-09-16 DIAGNOSIS — F4310 Post-traumatic stress disorder, unspecified: Secondary | ICD-10-CM

## 2021-09-16 DIAGNOSIS — F1420 Cocaine dependence, uncomplicated: Secondary | ICD-10-CM

## 2021-09-16 DIAGNOSIS — F1910 Other psychoactive substance abuse, uncomplicated: Secondary | ICD-10-CM

## 2021-09-16 DIAGNOSIS — F411 Generalized anxiety disorder: Secondary | ICD-10-CM

## 2021-09-16 DIAGNOSIS — F29 Unspecified psychosis not due to a substance or known physiological condition: Secondary | ICD-10-CM

## 2021-09-16 DIAGNOSIS — Z8659 Personal history of other mental and behavioral disorders: Secondary | ICD-10-CM

## 2021-09-16 DIAGNOSIS — F1220 Cannabis dependence, uncomplicated: Secondary | ICD-10-CM

## 2021-09-16 DIAGNOSIS — F332 Major depressive disorder, recurrent severe without psychotic features: Secondary | ICD-10-CM

## 2021-11-08 ENCOUNTER — Other Ambulatory Visit: Payer: Self-pay | Admitting: Nurse Practitioner

## 2021-11-08 DIAGNOSIS — R5381 Other malaise: Secondary | ICD-10-CM

## 2021-11-11 ENCOUNTER — Other Ambulatory Visit: Payer: Self-pay | Admitting: Nurse Practitioner

## 2021-11-11 DIAGNOSIS — Z1231 Encounter for screening mammogram for malignant neoplasm of breast: Secondary | ICD-10-CM

## 2022-02-21 ENCOUNTER — Other Ambulatory Visit: Payer: Self-pay | Admitting: Nurse Practitioner

## 2022-02-21 ENCOUNTER — Ambulatory Visit
Admission: RE | Admit: 2022-02-21 | Discharge: 2022-02-21 | Disposition: A | Discharge status: Home / Self Care | Payer: Medicare Other | Source: Ambulatory Visit | Attending: Nurse Practitioner | Admitting: Nurse Practitioner

## 2022-02-21 DIAGNOSIS — R109 Unspecified abdominal pain: Secondary | ICD-10-CM

## 2022-03-12 LAB — COLOGUARD

## 2022-03-14 ENCOUNTER — Encounter: Payer: Self-pay | Admitting: Nurse Practitioner

## 2022-04-04 ENCOUNTER — Encounter: Payer: Self-pay | Admitting: Physician Assistant

## 2022-04-15 LAB — COLOGUARD

## 2022-04-28 ENCOUNTER — Encounter: Payer: Self-pay | Admitting: *Deleted

## 2022-05-05 ENCOUNTER — Ambulatory Visit (INDEPENDENT_AMBULATORY_CARE_PROVIDER_SITE_OTHER): Payer: 59 | Admitting: Physician Assistant

## 2022-05-05 ENCOUNTER — Encounter: Payer: Self-pay | Admitting: Physician Assistant

## 2022-05-05 VITALS — BP 126/80 | HR 98 | Ht 63.5 in | Wt 170.4 lb

## 2022-05-05 DIAGNOSIS — K219 Gastro-esophageal reflux disease without esophagitis: Secondary | ICD-10-CM | POA: Diagnosis not present

## 2022-05-05 DIAGNOSIS — R194 Change in bowel habit: Secondary | ICD-10-CM

## 2022-05-05 DIAGNOSIS — R1084 Generalized abdominal pain: Secondary | ICD-10-CM

## 2022-05-05 DIAGNOSIS — R1110 Vomiting, unspecified: Secondary | ICD-10-CM | POA: Diagnosis not present

## 2022-05-05 MED ORDER — PLENVU 140 G PO SOLR: 1.0000 | ORAL | 0 refills | Status: DC

## 2022-05-05 MED ORDER — ONDANSETRON HCL 4 MG PO TABS: 4.0000 mg | ORAL_TABLET | Freq: Four times a day (QID) | ORAL | 1 refills | Status: AC | PRN

## 2022-05-05 NOTE — Progress Notes (Signed)
Chief Complaint: Change in bowel habits, nausea and vomiting, GERD  HPI:    [name] is a 51 year old African-American female with a past medical history as listed below including anxiety, PTSD and multiple others, who presents to clinic today with a complaint of change in bowel habits, nausea and vomiting and GERD.    02/21/2022 abdominal x-ray for stomach pain and right lower quadrant pain which was negative.    04/11/2022 patient attempted to return her Cologuard test which could not be done.    Today, the patient presents to clinic accompanied by her significant other and tells me that she has always had some issues with reflux for 30+ years but most recently over the past 6 months or so about every other week she will have episodes of eating and vomiting anything that she eats with a lot of stomach cramping over the sides of her abdomen which leads to decreased appetite.  Has also noticed a lot of bloating and gas and now having occasional diarrhea.  She has been on Omeprazole 20 mg daily for the past 4 months and does not feel like it has really helped.  Has also tried Pepto-Bismol but does not feel like it helps.  She avoids acidic and tomato-based foods as this definitely makes her symptoms worse.    Denies fever, chills, weight loss or blood in her stool.  Past Medical History:  Diagnosis Date   Acid reflux    Anxiety    Asthma    Drug abuse ([place])    ETOH abuse    Hypertension    Manic-depressive disorder ([place])    PTSD (post-traumatic stress disorder)    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    Past Surgical History:  Procedure Laterality Date   TUBAL LIGATION      Current Outpatient Medications  Medication Sig Dispense Refill   albuterol (VENTOLIN HFA) 108 (90 Base) MCG/ACT inhaler Inhale 1-2 puffs into the lungs every 6 (six) hours as needed for wheezing or shortness of breath. 18 g 0   amLODipine (NORVASC) 5 MG tablet Take 1 tablet (5 mg total) by mouth daily. 30 tablet 0   melatonin  3 MG TABS tablet Take 1 tablet (3 mg total) by mouth at bedtime.  0   nicotine (NICODERM CQ - DOSED IN MG/24 HOURS) 14 mg/24hr patch Place 1 patch (14 mg total) onto the skin daily as needed (nicotine use). 28 patch 0   OXcarbazepine (TRILEPTAL) 150 MG tablet Take 1 tablet (150 mg total) by mouth 2 (two) times daily. 60 tablet 0   sertraline (ZOLOFT) 25 MG tablet Take 3 tablets (75 mg total) by mouth daily. 90 tablet 0   No current facility-administered medications for this visit.    Allergies as of 05/05/2022 - Review Complete 08/01/2021  Allergen Reaction Noted   Benadryl [diphenhydramine]  05/16/2019   Penicillins Nausea And Vomiting 08/01/2021   Latex Rash 05/03/2021    Family History  Problem Relation Age of Onset   Diabetes Mother    Hypertension Mother     Social History   Socioeconomic History   Marital status: Divorced    Spouse name: Not on file   Number of children: Not on file   Years of education: Not on file   Highest education level: Not on file  Occupational History   Not on file  Tobacco Use   Smoking status: Every Day    Packs/day: 0.50    Years: 29.00    Total  pack years: 14.50    Types: Cigarettes   Smokeless tobacco: Never   Tobacco comments:    in rehab  Vaping Use   Vaping Use: Never used  Substance and Sexual Activity   Alcohol use: Yes   Drug use: Yes    Types: Cocaine, Marijuana    Comment: crack   Sexual activity: Yes    Birth control/protection: None  Other Topics Concern   Not on file  Social History Narrative   Not on file   Social Determinants of Health   Financial Resource Strain: Not on file  Food Insecurity: Not on file  Transportation Needs: Not on file  Physical Activity: Not on file  Stress: Not on file  Social Connections: Not on file  Intimate Partner Violence: Not on file    Review of Systems:    Constitutional: No weight loss, fever or chills Skin: No rash  Cardiovascular: No chest pain Respiratory: No  SOB Gastrointestinal: See HPI and otherwise negative Genitourinary: No dysuria  Neurological: No headache Musculoskeletal: No new muscle or joint pain Hematologic: No bleeding  Psychiatric: No history of depression or anxiety   Physical Exam:  Vital signs: BP 126/80   Pulse 98   Ht 5' 3.5" (1.613 m)   Wt 170 lb 6 oz (77.3 kg)   BMI 29.71 kg/m    Constitutional:   Pleasant AA female appears to be in NAD, Well developed, Well nourished, alert and cooperative Head:  Normocephalic and atraumatic. Eyes:   PEERL, EOMI. No icterus. Conjunctiva pink. Ears:  Normal auditory acuity. Neck:  Supple Throat: Oral cavity and pharynx without inflammation, swelling or lesion.  Respiratory: Respirations even and unlabored. Lungs clear to auscultation bilaterally.   No wheezes, crackles, or rhonchi.  Cardiovascular: Normal S1, S2. No MRG. Regular rate and rhythm. No peripheral edema, cyanosis or pallor.  Gastrointestinal:  Soft, mild to moderate distention, mild-moderate epigastric TTP, no rebound or guarding. Normal bowel sounds. No appreciable masses or hepatomegaly. Rectal:  Not performed.  Msk:  Symmetrical without gross deformities. Without edema, no deformity or joint abnormality.  Neurologic:  Alert and  oriented x4;  grossly normal neurologically.  Skin:   Dry and intact without significant lesions or rashes. Psychiatric: Demonstrates good judgement and reason without abnormal affect or behaviors.  RELEVANT LABS AND IMAGING: CBC    Component Value Date/Time   WBC 7.7 08/01/2021 0816   RBC 4.75 08/01/2021 0816   HGB 14.2 08/01/2021 0816   HCT 44.2 08/01/2021 0816   PLT 314 08/01/2021 0816   MCV 93.1 08/01/2021 0816   MCH 29.9 08/01/2021 0816   MCHC 32.1 08/01/2021 0816   RDW 15.2 08/01/2021 0816   LYMPHSABS 2.7 05/02/2021 2354   MONOABS 0.5 05/02/2021 2354   EOSABS 0.2 05/02/2021 2354   BASOSABS 0.1 05/02/2021 2354    CMP     Component Value Date/Time   NA 138 08/01/2021  0816   K 3.7 08/01/2021 0816   CL 105 08/01/2021 0816   CO2 25 08/01/2021 0816   GLUCOSE 74 08/01/2021 0816   BUN 19 08/01/2021 0816   CREATININE 0.54 08/01/2021 0816   CALCIUM 9.2 08/01/2021 0816   PROT 8.1 08/01/2021 0816   ALBUMIN 3.9 08/01/2021 0816   AST 20 08/01/2021 0816   ALT 13 08/01/2021 0816   ALKPHOS 80 08/01/2021 0816   BILITOT 0.3 08/01/2021 0816   GFRNONAA >60 08/01/2021 0816   GFRAA >60 11/17/2018 1521    Assessment: 1.  Generalized abdominal pain:  Worse in the epigastrium and also sides of her abdomen typically after eating; likely related to gastritis/GERD +/- H. pylori +/- PUD 2.  Change in bowel habits: With episodes of diarrhea; consider relation to above +/- IBS 3.  Nausea and vomiting: Off-and-on for a week at a time; consider relation to functional dyspepsia versus GERD 4.  GERD: With above 5.  Bloating: Consider with above +/- SIBO  Plan: 1.  Scheduled patient for diagnostic EGD and colonoscopy in the [place] with Dr. Havery Moros.  Did provide the patient a detailed list of risks for the procedures and she agrees to proceed. Patient is appropriate for endoscopic procedure(s) in the ambulatory ([place]) setting. 2.  Increased Omeprazole to 40 mg twice daily, 30-60 minutes before breakfast and dinner #60 with 5 refills. 3.  Prescribed Zofran 4 mg ODT 1-2 tabs every 4-6 hours as needed for nausea #30 with 1 refill. 4.  Patient to follow in clinic per recommendations after time of procedures.  Ellouise Newer, PA-C [facility] 05/05/2022, 2:22 PM  Cc: Willene Hatchet, NP

## 2022-05-05 NOTE — Patient Instructions (Addendum)
  If you are age 53 or older, your body mass index should be between 23-30. Your Body mass index is 29.71 kg/m. If this is out of the aforementioned range listed, please consider follow up with your Primary Care Provider.  If you are age 77 or younger, your body mass index should be between 19-25. Your Body mass index is 29.71 kg/m. If this is out of the aformentioned range listed, please consider follow up with your Primary Care Provider.   ________________________________________________________  The [place] [facility] providers would like to encourage you to use [facility] to communicate with providers for non-urgent requests or questions.  Due to long hold times on the telephone, sending your provider a message by [facility] may be a faster and more efficient way to get a response.  Please allow 48 business hours for a response.  Please remember that this is for non-urgent requests.   You have been scheduled for an endoscopy and colonoscopy. Please follow the written instructions given to you at your visit today. Please pick up your prep supplies at the pharmacy within the next 1-3 days. If you use inhalers (even only as needed), please bring them with you on the day of your procedure.   .Due to recent changes in healthcare laws, you may see the results of your imaging and laboratory studies on MyChart before your provider has had a chance to review them.  We understand that in some cases there may be results that are confusing or concerning to you. Not all laboratory results come back in the same time frame and the provider may be waiting for multiple results in order to interpret others.  Please give [place] 48 hours in order for your provider to thoroughly review all the results before contacting the office for clarification of your results.    Thank you for entrusting me with your care and choosing [facility].  Ellouise Newer PA-C

## 2022-05-05 NOTE — Progress Notes (Signed)
Agree with assessment and plan as outlined.  

## 2022-06-07 ENCOUNTER — Encounter: Payer: Self-pay | Admitting: Gastroenterology

## 2022-06-12 ENCOUNTER — Telehealth: Payer: Self-pay | Admitting: Physician Assistant

## 2022-06-12 NOTE — Telephone Encounter (Addendum)
Spoke to patient advise  STOP eating nuts, seeds, popcorn, corn, beans, peas, salads, or any raw vegetables.  Do not take any fiber supplements (e.g. Metamucil, Citrucel, and Benefiber) or IRON supplements.   No solid foods on Tuesday 06/13/22 Drink clear liquids the entire day NO Red or Purple. Patient verbalized understanding.

## 2022-06-12 NOTE — Telephone Encounter (Signed)
Inbound call from patient in regards to prep. Please advise,.

## 2022-06-14 ENCOUNTER — Encounter: Payer: Self-pay | Admitting: Gastroenterology

## 2022-06-14 ENCOUNTER — Ambulatory Visit (AMBULATORY_SURGERY_CENTER): Payer: Medicare Other | Admitting: Gastroenterology

## 2022-06-14 VITALS — BP 135/83 | HR 84 | Temp 97.1°F | Resp 14 | Ht 63.5 in | Wt 170.0 lb

## 2022-06-14 DIAGNOSIS — D125 Benign neoplasm of sigmoid colon: Secondary | ICD-10-CM

## 2022-06-14 DIAGNOSIS — R1013 Epigastric pain: Secondary | ICD-10-CM

## 2022-06-14 DIAGNOSIS — K219 Gastro-esophageal reflux disease without esophagitis: Secondary | ICD-10-CM

## 2022-06-14 DIAGNOSIS — K635 Polyp of colon: Secondary | ICD-10-CM | POA: Diagnosis not present

## 2022-06-14 DIAGNOSIS — R1110 Vomiting, unspecified: Secondary | ICD-10-CM

## 2022-06-14 DIAGNOSIS — R195 Other fecal abnormalities: Secondary | ICD-10-CM

## 2022-06-14 DIAGNOSIS — Z1211 Encounter for screening for malignant neoplasm of colon: Secondary | ICD-10-CM | POA: Diagnosis not present

## 2022-06-14 MED ORDER — SODIUM CHLORIDE 0.9 % IV SOLN: 500.0000 mL | Freq: Once | INTRAVENOUS | Status: DC

## 2022-06-14 NOTE — Patient Instructions (Addendum)
Please read handouts provided. Continue present medications. Zofran as needed for nausea. Await pathology results. Consideration for trial of higher dose omeprazole ( twice daily ).  YOU HAD AN ENDOSCOPIC PROCEDURE TODAY AT [facility]:   Refer to the procedure report that was given to you for any specific questions about what was found during the examination.  If the procedure report does not answer your questions, please call your gastroenterologist to clarify.  If you requested that your care partner not be given the details of your procedure findings, then the procedure report has been included in a sealed envelope for you to review at your convenience later.  YOU SHOULD EXPECT: Some feelings of bloating in the abdomen. Passage of more gas than usual.  Walking can help get rid of the air that was put into your GI tract during the procedure and reduce the bloating. If you had a lower endoscopy (such as a colonoscopy or flexible sigmoidoscopy) you may notice spotting of blood in your stool or on the toilet paper. If you underwent a bowel prep for your procedure, you may not have a normal bowel movement for a few days.  Please Note:  You might notice some irritation and congestion in your nose or some drainage.  This is from the oxygen used during your procedure.  There is no need for concern and it should clear up in a day or so.  SYMPTOMS TO REPORT IMMEDIATELY:  Following lower endoscopy (colonoscopy or flexible sigmoidoscopy):  Excessive amounts of blood in the stool  Significant tenderness or worsening of abdominal pains  Swelling of the abdomen that is new, acute  Fever of 100F or higher  Following upper endoscopy (EGD)  Vomiting of blood or coffee ground material  New chest pain or pain under the shoulder blades  Painful or persistently difficult swallowing  New shortness of breath  Fever of 100F or higher  Black, tarry-looking stools  For urgent or emergent  issues, a gastroenterologist can be reached at any hour by calling [phone]. Do not use MyChart messaging for urgent concerns.    DIET:  We do recommend a small meal at first, but then you may proceed to your regular diet.  Drink plenty of fluids but you should avoid alcoholic beverages for 24 hours.  ACTIVITY:  You should plan to take it easy for the rest of today and you should NOT DRIVE or use heavy machinery until tomorrow (because of the sedation medicines used during the test).    FOLLOW UP: Our staff will call the number listed on your records the next business day following your procedure.  We will call around 7:15- 8:00 am to check on you and address any questions or concerns that you may have regarding the information given to you following your procedure. If we do not reach you, we will leave a message.     If any biopsies were taken you will be contacted by phone or by letter within the next 1-3 weeks.  Please call us at [phone] if you have not heard about the biopsies in 3 weeks.    SIGNATURES/CONFIDENTIALITY: You and/or your care partner have signed paperwork which will be entered into your electronic medical record.  These signatures attest to the fact that that the information above on your After Visit Summary has been reviewed and is understood.  Full responsibility of the confidentiality of this discharge information lies with you and/or your care-partner.

## 2022-06-14 NOTE — Progress Notes (Signed)
Sedate, gd SR, tolerated procedure well, VSS, report to RN 

## 2022-06-14 NOTE — Op Note (Signed)
[facility] Patient Name: [name] Procedure Date: 06/14/2022 3:56 PM MRN: [id number] Endoscopist: Viviann Spare P. Adela Lank , MD, [id number] Age: 51 Referring MD:  Date of Birth: 06-08-1971 Gender: Female Account #: [id number] Procedure:                Colonoscopy Indications:              altered bowel habits / chronic diarrhea - first                            time colonoscopy Medicines:                Monitored Anesthesia Care Procedure:                Pre-Anesthesia Assessment:                           - Prior to the procedure, a History and Physical                            was performed, and patient medications and                            allergies were reviewed. The patient's tolerance of                            previous anesthesia was also reviewed. The risks                            and benefits of the procedure and the sedation                            options and risks were discussed with the patient.                            All questions were answered, and informed consent                            was obtained. Prior Anticoagulants: The patient has                            taken no anticoagulant or antiplatelet agents. ASA                            Grade Assessment: III - A patient with severe                            systemic disease. After reviewing the risks and                            benefits, the patient was deemed in satisfactory                            condition to undergo the procedure.  After obtaining informed consent, the colonoscope                            was passed under direct vision. Throughout the                            procedure, the patient's blood pressure, pulse, and                            oxygen saturations were monitored continuously. The                            PCF-HQ190L Colonoscope was introduced through the                            anus and advanced to the the  terminal ileum, with                            identification of the appendiceal orifice and IC                            valve. The colonoscopy was performed without                            difficulty. The patient tolerated the procedure                            well. The quality of the bowel preparation was                            good. The terminal ileum, ileocecal valve,                            appendiceal orifice, and rectum were photographed. Scope In: 4:10:29 PM Scope Out: 4:27:51 PM Scope Withdrawal Time: 0 hours 14 minutes 30 seconds  Total Procedure Duration: 0 hours 17 minutes 22 seconds  Findings:                 The perianal and digital rectal examinations were                            normal.                           The terminal ileum appeared normal.                           A diminutive polyp was found in the sigmoid colon.                            The polyp was sessile. The polyp was removed with a                            cold biopsy forceps. Resection and retrieval were  complete.                           A few small-mouthed diverticula were found in the                            left colon.                           Anal papilla(e) were hypertrophied.                           The exam was otherwise without abnormality.                           Biopsies for histology were taken with a cold                            forceps from the right colon, left colon and                            transverse colon for evaluation of microscopic                            colitis. Complications:            No immediate complications. Estimated blood loss:                            Minimal. Estimated Blood Loss:     Estimated blood loss was minimal. Impression:               - The examined portion of the ileum was normal.                           - One diminutive polyp in the sigmoid colon,                            removed  with a cold biopsy forceps. Resected and                            retrieved.                           - Diverticulosis in the left colon.                           - Anal papilla(e) were hypertrophied.                           - The examination was otherwise normal.                           - Biopsies were taken with a cold forceps from the                            right colon, left colon and transverse  colon for                            evaluation of microscopic colitis. Recommendation:           - Patient has a contact number available for                            emergencies. The signs and symptoms of potential                            delayed complications were discussed with the                            patient. Return to normal activities tomorrow.                            Written discharge instructions were provided to the                            patient.                           - Resume previous diet.                           - Continue present medications.                           - Await pathology results. Viviann Spare P. Shamya Macfadden, MD 06/14/2022 4:33:00 PM This report has been signed electronically.

## 2022-06-14 NOTE — Progress Notes (Signed)
Called to room to assist during endoscopic procedure.  Patient ID and intended procedure confirmed with present staff. Received instructions for my participation in the procedure from the performing physician.  

## 2022-06-14 NOTE — Progress Notes (Signed)
[facility] History and Physical   Primary Care Physician:  Patient, No Pcp Per   Reason for Procedure:   GERD, nausea / vomiting, epigastric pain, loose stools, colon cancer screening  Plan:    EGD and colonoscopy     HPI: [name] is a 51 y.o. female  here for EGD and colonoscopy to evaluate issues above. No prior colonoscopy exams. She has been having loose stools for 4-5 months. Also with ongoing upper tract symptoms despite lower dose omeprazole. WAs recommended she try higher dose omeprazole after clinic visit but she has not started it yet. No family history of colon cancer known. Otherwise feels well without any cardiopulmonary symptoms.   I have discussed risks / benefits of anesthesia and endoscopic procedure with Cristopher Estimable and they wish to proceed with the exams as outlined today.    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 Authorizing Provider  albuterol (VENTOLIN HFA) 108 (90 Base) MCG/ACT inhaler Inhale 1-2 puffs into the lungs every 6 (six) hours as needed for wheezing or shortness of breath. 04/11/21  Yes Lamptey, Myrene Galas, MD  gabapentin (NEURONTIN) 600 MG tablet Take 600 mg by mouth 3 (three) times daily as needed. 04/03/22  Yes [provider]  lamoTRIgine (LAMICTAL) 25 MG tablet Take 25 mg by mouth daily. 03/30/22  Yes [provider]  melatonin 3 MG TABS tablet Take 1 tablet (3 mg total) by mouth at bedtime. 08/06/21  Yes [name], DO  metoprolol succinate (TOPROL-XL) 25 MG 24 hr tablet Take 25 mg by mouth  daily. 02/26/22  Yes [provider]  montelukast (SINGULAIR) 10 MG tablet Take 10 mg by mouth daily. 05/25/22  Yes [provider]  nicotine (NICODERM CQ - DOSED IN MG/24 HOURS) 14 mg/24hr patch Place 1 patch (14 mg total) onto the skin daily as needed (nicotine use). 08/06/21  Yes [name], DO  QUEtiapine (SEROQUEL) 25 MG tablet Take 25 mg by mouth at bedtime. 11/09/21  Yes [provider]  rosuvastatin (CRESTOR) 20 MG tablet Take 20 mg by mouth daily. 04/12/22  Yes [provider]  traZODone (DESYREL) 100 MG tablet Take 100 mg by mouth at bedtime. 03/30/22  Yes [provider]  triamcinolone ointment (KENALOG) 0.1 % Apply topically 2 (two) times daily. 03/04/22  Yes [provider]  ADVAIR [facility] 115-21 MCG/ACT inhaler SMARTSIG:2 Puff(s) By Mouth Every 12 Hours 04/03/22   [provider]  amLODipine (NORVASC) 5 MG tablet Take 1 tablet (5 mg total) by mouth daily. 08/07/21 09/06/21  Briant Cedar, MD  omeprazole (PRILOSEC) 20 MG capsule Take by mouth daily. 02/07/22   [provider]  ondansetron (ZOFRAN) 4 MG tablet Take 1 tablet (4 mg total) by mouth every 6 (six) hours as needed for nausea or vomiting. 05/05/22   Levin Erp, PA  OXcarbazepine (TRILEPTAL) 150 MG tablet Take 1 tablet (150 mg total) by mouth  2 (two) times daily. 08/07/21 09/06/21  Briant Cedar, MD  sertraline (ZOLOFT) 25 MG tablet Take 3 tablets (75 mg total) by mouth daily. 08/07/21 09/06/21  Briant Cedar, MD  Varenicline Tartrate, Starter, 0.5 MG X 11 & 1 MG X 42 TBPK Take by mouth. 06/14/22   [provider]  clonazePAM (KLONOPIN) 0.5 MG tablet Take 0.5 mg by mouth 3 (three) times daily as needed for anxiety.  04/06/20  [provider]  pantoprazole (PROTONIX) 40 MG tablet Take 40 mg by mouth daily.  10/23/13 04/06/20  [provider]    Current Outpatient Medications  Medication Sig Dispense Refill    albuterol (VENTOLIN HFA) 108 (90 Base) MCG/ACT inhaler Inhale 1-2 puffs into the lungs every 6 (six) hours as needed for wheezing or shortness of breath. 18 g 0   gabapentin (NEURONTIN) 600 MG tablet Take 600 mg by mouth 3 (three) times daily as needed.     lamoTRIgine (LAMICTAL) 25 MG tablet Take 25 mg by mouth daily.     melatonin 3 MG TABS tablet Take 1 tablet (3 mg total) by mouth at bedtime.  0   metoprolol succinate (TOPROL-XL) 25 MG 24 hr tablet Take 25 mg by mouth daily.     montelukast (SINGULAIR) 10 MG tablet Take 10 mg by mouth daily.     nicotine (NICODERM CQ - DOSED IN MG/24 HOURS) 14 mg/24hr patch Place 1 patch (14 mg total) onto the skin daily as needed (nicotine use). 28 patch 0   QUEtiapine (SEROQUEL) 25 MG tablet Take 25 mg by mouth at bedtime.     rosuvastatin (CRESTOR) 20 MG tablet Take 20 mg by mouth daily.     traZODone (DESYREL) 100 MG tablet Take 100 mg by mouth at bedtime.     triamcinolone ointment (KENALOG) 0.1 % Apply topically 2 (two) times daily.     ADVAIR HFA 115-21 MCG/ACT inhaler SMARTSIG:2 Puff(s) By Mouth Every 12 Hours     amLODipine (NORVASC) 5 MG tablet Take 1 tablet (5 mg total) by mouth daily. 30 tablet 0   omeprazole (PRILOSEC) 20 MG capsule Take by mouth daily.     ondansetron (ZOFRAN) 4 MG tablet Take 1 tablet (4 mg total) by mouth every 6 (six) hours as needed for nausea or vomiting. 30 tablet 1   OXcarbazepine (TRILEPTAL) 150 MG tablet Take 1 tablet (150 mg total) by mouth 2 (two) times daily. 60 tablet 0   sertraline (ZOLOFT) 25 MG tablet Take 3 tablets (75 mg total) by mouth daily. 90 tablet 0   Varenicline Tartrate, Starter, 0.5 MG X 11 & 1 MG X 42 TBPK Take by mouth.     Current Facility-Administered Medications  Medication Dose Route Frequency Provider Last Rate Last Admin   0.9 %  sodium chloride infusion  500 mL Intravenous Once Tiajuana Leppanen, Carlota Raspberry, MD        Allergies as of 06/14/2022 - Review Complete 06/14/2022  Allergen Reaction Noted    Benadryl [diphenhydramine]  05/16/2019   Penicillins Nausea And Vomiting 08/01/2021   Latex Rash 05/03/2021    Family History  Problem Relation Age of Onset   Diabetes Mother    Hypertension Mother    Heart disease Maternal Grandmother     Social History   Socioeconomic History   Marital status: Divorced    Spouse name: Not on file   Number of children: Not on file   Years of education: Not on file   Highest education level:  Not on file  Occupational History   Not on file  Tobacco Use   Smoking status: Every Day    Packs/day: 0.50    Years: 29.00    Total pack years: 14.50    Types: Cigarettes   Smokeless tobacco: Never   Tobacco comments:    in rehab  Vaping Use   Vaping Use: Never used  Substance and Sexual Activity   Alcohol use: Yes   Drug use: Yes    Types: Cocaine, Marijuana    Comment: crack   Sexual activity: Yes    Birth control/protection: None  Other Topics Concern   Not on file  Social History Narrative   Not on file   Social Determinants of Health   Financial Resource Strain: Not on file  Food Insecurity: Not on file  Transportation Needs: Not on file  Physical Activity: Not on file  Stress: Not on file  Social Connections: Not on file  Intimate Partner Violence: Not on file    Review of Systems: All other review of systems negative except as mentioned in the HPI.  Physical Exam: Vital signs BP 127/70 (BP Location: Right Arm, Patient Position: Sitting, Cuff Size: Normal)   Pulse (!) 105   Temp (!) 97.1 F (36.2 C) (Temporal)   Ht 5' 3.5" (1.613 m)   Wt 170 lb (77.1 kg)   SpO2 96%   BMI 29.64 kg/m   General:   Alert,  Well-developed, pleasant and cooperative in NAD Lungs:  Clear throughout to auscultation.   Heart:  Regular rate and rhythm Abdomen:  Soft, nontender and nondistended.   Neuro/Psych:  Alert and cooperative. Normal mood and affect. A and O x 3  Jolly Mango, MD [facility]

## 2022-06-14 NOTE — Op Note (Signed)
[facility] Patient Name: [name] Procedure Date: 06/14/2022 3:52 PM MRN: [id number] Endoscopist: Viviann Spare P. Adela Lank , MD, [id number] Age: 51 Referring MD:  Date of Birth: 1971-03-16 Gender: Female Account #: [id number] Procedure:                Upper GI endoscopy Indications:              GERD, epigastric abdominal pain, occasional nausea                            / vomiting, loose stools - low dose omeprazole did                            not help, has not tried higher dose yet Medicines:                Monitored Anesthesia Care Procedure:                Pre-Anesthesia Assessment:                           - Prior to the procedure, a History and Physical                            was performed, and patient medications and                            allergies were reviewed. The patient's tolerance of                            previous anesthesia was also reviewed. The risks                            and benefits of the procedure and the sedation                            options and risks were discussed with the patient.                            All questions were answered, and informed consent                            was obtained. Prior Anticoagulants: The patient has                            taken no anticoagulant or antiplatelet agents. ASA                            Grade Assessment: II - A patient with mild systemic                            disease. After reviewing the risks and benefits,                            the patient was deemed in satisfactory condition to  undergo the procedure.                           After obtaining informed consent, the endoscope was                            passed under direct vision. Throughout the                            procedure, the patient's blood pressure, pulse, and                            oxygen saturations were monitored continuously. The                             Endoscope was introduced through the mouth, and                            advanced to the second part of duodenum. The upper                            GI endoscopy was accomplished without difficulty.                            The patient tolerated the procedure well. Scope In: Scope Out: Findings:                 Esophagogastric landmarks were identified: the                            Z-line was found at 38 cm, the gastroesophageal                            junction was found at 38 cm and the upper extent of                            the gastric folds was found at 39 cm from the                            incisors.                           A 1 cm hiatal hernia was present.                           Mucosal changes characterized by focal nodularity                            were found at the gastroesophageal junction. I                            suspect inflammatory / reactive to reflux, biopsies                            were taken with  a cold forceps for histology.                           The exam of the esophagus was otherwise normal.                           The entire examined stomach was normal. Biopsies                            were taken with a cold forceps for Helicobacter                            pylori testing.                           The examined duodenum was normal. Biopsies for                            histology were taken with a cold forceps for                            evaluation of celiac disease. Complications:            No immediate complications. Estimated blood loss:                            Minimal. Estimated Blood Loss:     Estimated blood loss was minimal. Impression:               - Esophagogastric landmarks identified.                           - 1 cm hiatal hernia.                           - Focally mucosa at the GEJ I suspect inflammatory                            / reactive in etiology . Biopsied.                           -  Normal stomach. Biopsied.                           - Normal examined duodenum. Biopsied. Recommendation:           - Patient has a contact number available for                            emergencies. The signs and symptoms of potential                            delayed complications were discussed with the                            patient. Return to normal activities tomorrow.  Written discharge instructions were provided to the                            patient.                           - Resume previous diet.                           - Continue present medications.                           - Consideration for trial of higher dose omeprazole                            if not yet tried (twice daily)                           - Zofran as needed for nausea                           - Await pathology results with further                            recommendations Viviann Spare P. Collier Bohnet, MD 06/14/2022 4:41:01 PM This report has been signed electronically.

## 2022-06-15 ENCOUNTER — Telehealth: Payer: Self-pay

## 2022-06-15 NOTE — Telephone Encounter (Signed)
  Follow up Call-     06/14/2022    2:15 PM 06/14/2022    2:11 PM  Call back number  Post procedure Call Back phone  # [phone]   Permission to leave phone message  Yes     Patient questions:  Do you have a fever, pain , or abdominal swelling? No. Pain Score  0 *  Have you tolerated food without any problems? Yes.    Have you been able to return to your normal activities? Yes.    Do you have any questions about your discharge instructions: Diet   No. Medications  No. Follow up visit  No.  Do you have questions or concerns about your Care? No.  Actions: * If pain score is 4 or above: No action needed, pain <4.

## 2022-07-09 ENCOUNTER — Encounter (HOSPITAL_COMMUNITY): Payer: Self-pay | Admitting: Emergency Medicine

## 2022-07-09 ENCOUNTER — Emergency Department (HOSPITAL_COMMUNITY)
Admission: EM | Admit: 2022-07-09 | Discharge: 2022-07-09 | Disposition: A | Discharge status: Home / Self Care | Payer: 59 | Attending: Emergency Medicine | Admitting: Emergency Medicine

## 2022-07-09 ENCOUNTER — Emergency Department (HOSPITAL_COMMUNITY): Payer: 59

## 2022-07-09 ENCOUNTER — Other Ambulatory Visit: Payer: Self-pay

## 2022-07-09 DIAGNOSIS — Z91040 Latex allergy status: Secondary | ICD-10-CM | POA: Insufficient documentation

## 2022-07-09 DIAGNOSIS — W1843XA Slipping, tripping and stumbling without falling due to stepping from one level to another, initial encounter: Secondary | ICD-10-CM | POA: Insufficient documentation

## 2022-07-09 DIAGNOSIS — S92414A Nondisplaced fracture of proximal phalanx of right great toe, initial encounter for closed fracture: Secondary | ICD-10-CM | POA: Diagnosis not present

## 2022-07-09 DIAGNOSIS — S99921A Unspecified injury of right foot, initial encounter: Secondary | ICD-10-CM | POA: Diagnosis present

## 2022-07-09 MED ORDER — ACETAMINOPHEN 500 MG PO TABS: 500.0000 mg | ORAL_TABLET | Freq: Four times a day (QID) | ORAL | 0 refills | Status: DC

## 2022-07-09 MED ORDER — OXYCODONE-ACETAMINOPHEN 5-325 MG PO TABS: 1.0000 | ORAL_TABLET | Freq: Two times a day (BID) | ORAL | 0 refills | Status: DC | PRN

## 2022-07-09 MED ORDER — OXYCODONE-ACETAMINOPHEN 5-325 MG PO TABS
1.0000 | ORAL_TABLET | Freq: Once | ORAL | Status: AC
  Administered 2022-07-09: 1 via ORAL
  Filled 2022-07-09: qty 1

## 2022-07-09 NOTE — ED Provider Triage Note (Signed)
Emergency Medicine Provider Triage Evaluation Note  [name] , a 52 y.o. female  was evaluated in triage.  Pt complains of toe pain.  Review of Systems  Positive: pain Negative: numbness  Physical Exam  BP (!) 147/95 (BP Location: Left Arm)   Pulse (!) 106   Temp 98.6 F (37 C) (Oral)   Resp 16   SpO2 94%  Gen:   Awake, no distress   Resp:  Normal effort  MSK:   Moves extremities without difficulty, focal toe swelling and pain, L side Other:    Medical Decision Making  Medically screening exam initiated at 2:18 PM.  Appropriate orders placed.  [name] was informed that the remainder of the evaluation will be completed by another provider, this initial triage assessment does not replace that evaluation, and the importance of remaining in the ED until their evaluation is complete.     Varney Biles, MD 07/09/22 1419

## 2022-07-09 NOTE — ED Provider Notes (Signed)
[place] DEPT Provider Note   CSN: [id number] Arrival date & time: 07/09/22  1215     History  Chief Complaint  Patient presents with   Toe Injury    [name] is a 52 y.o. female.  HPI     52 year old female comes in with chief complaint of toe injury.  Patient states that on Thursday she was getting out of her car, put weight on it awkwardly onto her toe and had immediate pain.  She thought that the pain will get better over time, but has not.  She is also noticing that there is now increased stiffness, prompting her to come to the ER 3 days after the injury.  Patient has been taking over-the-counter medications for pain relief, and getting transient relief only.  Home Medications Prior to Admission medications   Medication Sig Start Date End Date Taking? Authorizing Provider  acetaminophen (TYLENOL) 500 MG tablet Take 1 tablet (500 mg total) by mouth in the morning, at noon, in the evening, and at bedtime. 07/09/22  Yes Varney Biles, MD  oxyCODONE-acetaminophen (PERCOCET/ROXICET) 5-325 MG tablet Take 1 tablet by mouth every 12 (twelve) hours as needed for severe pain. 07/09/22  Yes Varney Biles, MD  ADVAIR [facility] 115-21 MCG/ACT inhaler SMARTSIG:2 Puff(s) By Mouth Every 12 Hours 04/03/22   [provider]  albuterol (VENTOLIN HFA) 108 (90 Base) MCG/ACT inhaler Inhale 1-2 puffs into the lungs every 6 (six) hours as needed for wheezing or shortness of breath. 04/11/21   Lamptey, Myrene Galas, MD  amLODipine (NORVASC) 5 MG tablet Take 1 tablet (5 mg total) by mouth daily. 08/07/21 09/06/21  Briant Cedar, MD  gabapentin (NEURONTIN) 600 MG tablet Take 600 mg by mouth 3 (three) times daily as needed. 04/03/22   [provider]  lamoTRIgine (LAMICTAL) 25 MG tablet Take 25 mg by mouth daily. 03/30/22   [provider]  melatonin 3 MG TABS tablet Take 1 tablet (3 mg total) by mouth at bedtime. 08/06/21   [name], DO   metoprolol succinate (TOPROL-XL) 25 MG 24 hr tablet Take 25 mg by mouth daily. 02/26/22   [provider]  montelukast (SINGULAIR) 10 MG tablet Take 10 mg by mouth daily. 05/25/22   [provider]  nicotine (NICODERM CQ - DOSED IN MG/24 HOURS) 14 mg/24hr patch Place 1 patch (14 mg total) onto the skin daily as needed (nicotine use). 08/06/21   [name], DO  omeprazole (PRILOSEC) 20 MG capsule Take by mouth daily. 02/07/22   [provider]  ondansetron (ZOFRAN) 4 MG tablet Take 1 tablet (4 mg total) by mouth every 6 (six) hours as needed for nausea or vomiting. 05/05/22   Levin Erp, PA  OXcarbazepine (TRILEPTAL) 150 MG tablet Take 1 tablet (150 mg total) by mouth 2 (two) times daily. 08/07/21 09/06/21  Briant Cedar, MD  QUEtiapine (SEROQUEL) 25 MG tablet Take 25 mg by mouth at bedtime. 11/09/21   [provider]  rosuvastatin (CRESTOR) 20 MG tablet Take 20 mg by mouth daily. 04/12/22   [provider]  sertraline (ZOLOFT) 25 MG tablet Take 3 tablets (75 mg total) by mouth daily. 08/07/21 09/06/21  Briant Cedar, MD  traZODone (DESYREL) 100 MG tablet Take 100 mg by mouth at bedtime. 03/30/22   [provider]  triamcinolone ointment (KENALOG) 0.1 % Apply topically 2 (two) times daily. 03/04/22   [provider]  Varenicline Tartrate, Starter, 0.5 MG X 11 &  1 MG X 42 TBPK Take by mouth. 06/14/22   [provider]  clonazePAM (KLONOPIN) 0.5 MG tablet Take 0.5 mg by mouth 3 (three) times daily as needed for anxiety.  04/06/20  [provider]  pantoprazole (PROTONIX) 40 MG tablet Take 40 mg by mouth daily.  10/23/13 04/06/20  [provider]      Allergies    Benadryl [diphenhydramine], Penicillins, and Latex    Review of Systems   Review of Systems  Physical Exam Updated Vital Signs BP (!) 147/95 (BP Location: Left Arm)   Pulse (!) 106   Temp 98.6 F (37 C) (Oral)   Resp 16    SpO2 94%  Physical Exam Vitals and nursing note reviewed.  Constitutional:      Appearance: She is well-developed.  HENT:     Head: Atraumatic.  Cardiovascular:     Rate and Rhythm: Normal rate.  Pulmonary:     Effort: Pulmonary effort is normal.  Musculoskeletal:        General: Swelling and tenderness present. No deformity.     Cervical back: Normal range of motion and neck supple.  Skin:    General: Skin is warm and dry.  Neurological:     Mental Status: She is alert and oriented to person, place, and time.     ED Results / Procedures / Treatments   Labs (all labs ordered are listed, but only abnormal results are displayed) Labs Reviewed - No data to display  EKG None  Radiology DG Toe Great Left  Result Date: 07/09/2022 CLINICAL DATA:  Toe pain.  Hyperflexion injury. EXAM: LEFT GREAT TOE COMPARISON:  None Available. FINDINGS: Comminuted and minimally displaced fracture of the great toe proximal phalanx. Fracture extends to the interphalangeal joint without significant displacement. The remainder of the toe is intact. There is mild soft tissue edema. IMPRESSION: Comminuted and minimally displaced great toe proximal phalanx fracture extending to the interphalangeal joint. Electronically Signed   By: Keith Rake M.D.   On: 07/09/2022 14:35    Procedures Procedures    Medications Ordered in ED Medications  oxyCODONE-acetaminophen (PERCOCET/ROXICET) 5-325 MG per tablet 1 tablet (1 tablet Oral Given 07/09/22 1601)    ED Course/ Medical Decision Making/ A&P                             Medical Decision Making 52 year old patient comes in with chief complaint of right toe pain.  Injury occurred about 3 days ago.  She has clear tenderness over both the MTP and IP joint of the right great toe.  No significant bruising.  Skin is warm to touch, no evidence of vascular pathology.  X-ray of the toe was ordered and independently interpreted by me.  Clear evidence of  nondisplaced fracture.   We will place patient in buddy tape, give her postop shoe and crutches.  Results discussed with her along with recommendation for follow-up with orthopedic surgery in 2 weeks.  Problems Addressed: Closed nondisplaced fracture of proximal phalanx of right great toe, initial encounter: acute illness or injury  Amount and/or Complexity of Data Reviewed Radiology: ordered.  Risk OTC drugs. Prescription drug management.    Final Clinical Impression(s) / ED Diagnoses Final diagnoses:  Closed nondisplaced fracture of proximal phalanx of right great toe, initial encounter    Rx / DC Orders ED Discharge Orders          Ordered    oxyCODONE-acetaminophen (PERCOCET/ROXICET)  5-325 MG tablet  Every 12 hours PRN        07/09/22 1613    acetaminophen (TYLENOL) 500 MG tablet  4 times daily        07/09/22 1615              Varney Biles, MD 07/09/22 [phone]

## 2022-07-09 NOTE — Progress Notes (Signed)
Orthopedic Tech Progress Note Patient Details:  [name] February 27, 1971 [id number]  Ortho Devices Type of Ortho Device: Crutches, Postop shoe/boot Ortho Device/Splint Location: RLE Ortho Device/Splint Interventions: Ordered, Application, Adjustment   Post Interventions Patient Tolerated: Well Instructions Provided: Adjustment of device, Care of device, Poper ambulation with device  [name] L [name] 07/09/2022, 4:10 PM

## 2022-07-09 NOTE — ED Triage Notes (Signed)
52 yo female c/o of left side great toe pain after stepping out of her car the wrong way on Thursday. Pt reports severe pain after but states that she thought it was something she could ice and treat at home. Since then the swelling and pain has gotten significantly worse prompting her to be seen. Pt reports inability to weight bear on the left side.

## 2022-07-09 NOTE — Discharge Instructions (Addendum)
The x-ray confirms that you have a toe fracture.  Fortunately, it appears to be nondisplaced and likely will heal on its own in 4 to 6 weeks.  Keep the toe and buddy tape. Keep the toe /foot elevated. Weightbearing allowed as tolerated.  If significant pain, do not put any weight on your foot/toe.  Call the orthopedic doctors for a follow-up in 2 weeks.

## 2022-08-07 ENCOUNTER — Ambulatory Visit: Payer: 59 | Admitting: Gastroenterology

## 2022-08-07 NOTE — Progress Notes (Deleted)
HPI : seen in November  [name] is a 52 year old African-American female with a past medical history as listed below including anxiety, PTSD and multiple others, who presents to clinic today with a complaint of change in bowel habits, nausea and vomiting and GERD.    02/21/2022 abdominal x-ray for stomach pain and right lower quadrant pain which was negative.    04/11/2022 patient attempted to return her Cologuard test which could not be done.    Today, the patient presents to clinic accompanied by her significant other and tells me that she has always had some issues with reflux for 30+ years but most recently over the past 6 months or so about every other week she will have episodes of eating and vomiting anything that she eats with a lot of stomach cramping over the sides of her abdomen which leads to decreased appetite.  Has also noticed a lot of bloating and gas and now having occasional diarrhea.  She has been on Omeprazole 20 mg daily for the past 4 months and does not feel like it has really helped.  Has also tried Pepto-Bismol but does not feel like it helps.  She avoids acidic and tomato-based foods as this definitely makes her symptoms worse.    Denies fever, chills, weight loss or blood in her stool.  Assessment: 1.  Generalized abdominal pain: Worse in the epigastrium and also sides of her abdomen typically after eating; likely related to gastritis/GERD +/- H. pylori +/- PUD 2.  Change in bowel habits: With episodes of diarrhea; consider relation to above +/- IBS 3.  Nausea and vomiting: Off-and-on for a week at a time; consider relation to functional dyspepsia versus GERD 4.  GERD: With above 5.  Bloating: Consider with above +/- SIBO   Plan: 1.  Scheduled patient for diagnostic EGD and colonoscopy in the [place] with Dr. Havery Moros.  Did provide the patient a detailed list of risks for the procedures and she agrees to proceed. Patient is appropriate for endoscopic procedure(s) in the  ambulatory ([facility]) setting. 2.  Increased Omeprazole to 40 mg twice daily, 30-60 minutes before breakfast and dinner #60 with 5 refills. 3.  Prescribed Zofran 4 mg ODT 1-2 tabs every 4-6 hours as needed for nausea #30 with 1 refill. 4.  Patient to follow in clinic per recommendations after time of procedures.     EGD 06/14/22: - Esophagogastric landmarks identified.                           - 1 cm hiatal hernia.                           - Focally mucosa at the GEJ I suspect inflammatory                            / reactive in etiology . Biopsied.                           - Normal stomach. Biopsied.                           - Normal examined duodenum. Biopsied.   Colonoscopy 06/14/22: The perianal and digital rectal examinations were  normal.                           The terminal ileum appeared normal.                           A diminutive polyp was found in the sigmoid colon.                            The polyp was sessile. The polyp was removed with a                            cold biopsy forceps. Resection and retrieval were                            complete.                           A few small-mouthed diverticula were found in the                            left colon.                           Anal papilla(e) were hypertrophied.                           The exam was otherwise without abnormality.                           Biopsies for histology were taken with a cold                            forceps from the right colon, left colon and                            transverse colon for evaluation of microscopic                            colitis.  Diagnosis 1. Surgical [P], duodenal bx - BENIGN SMALL BOWEL MUCOSA WITH NO SIGNIFICANT PATHOLOGIC CHANGES 2. Surgical [P], gastric antrum and gastric body bx - GASTRIC ANTRAL AND OXYNTIC MUCOSA WITH FEATURES OF REACTIVE GASTROPATHY - NEGATIVE FOR H. PYLORI ON H&E STAIN - NEGATIVE FOR INTESTINAL  METAPLASIA OR MALIGNANCY 3. Surgical [P], GE junction bx - GASTRIC CARDIAC MUCOSA WITH NO SIGNIFICANT PATHOLOGIC CHANGES - NEGATIVE FOR INTESTINAL METAPLASIA, DYSPLASIA OR MALIGNANCY 4. Surgical [P], colon nos, random sites bx - BENIGN COLONIC MUCOSA WITH NO SPECIFIC PATHOLOGIC CHANGES - NEGATIVE FOR INCREASED INTRAEPITHELIAL LYMPHOCYTES OR THICKENED SUBEPITHELIAL COLLAGEN TABLE - NEGATIVE FOR DYSPLASIA OR MALIGNANCY 5. Surgical [P], colon, sigmoid, polyp (1) - HYPERPLASTIC POLYP. - NO DYSPLASIA OR MALIGNANCY.   [place] can you help relay the following: - biopsies of her stomach, small bowel, and colon are normal - biopsies of the GEJ without any pre-cancerous changes - polyp was benign, not pre-cancerous   - [place] can you place a recall for colonoscopy in 10 years   - patient should  try BID omeprazole, Zofran PRN nausea, and immodium PRN loose stools   I would like to see her back in the office for a follow up, discuss results, and plan moving forward, if you can help coordinate. 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     Past Surgical History:  Procedure Laterality Date   TUBAL LIGATION     Family History  Problem Relation Age of Onset   Diabetes Mother    Hypertension Mother    Heart disease Maternal Grandmother    Social History   Tobacco Use   Smoking status: Every Day    Packs/day: 0.50    Years: 29.00    Total pack years: 14.50    Types: Cigarettes   Smokeless tobacco: Never   Tobacco comments:    in rehab  Vaping Use   Vaping Use: Never used  Substance Use Topics   Alcohol use: Yes   Drug use: Yes    Types: Cocaine, Marijuana    Comment: crack   Current Outpatient Medications   Medication Sig Dispense Refill   acetaminophen (TYLENOL) 500 MG tablet Take 1 tablet (500 mg total) by mouth in the morning, at noon, in the evening, and at bedtime. 28 tablet 0   ADVAIR HFA 115-21 MCG/ACT inhaler SMARTSIG:2 Puff(s) By Mouth Every 12 Hours     albuterol (VENTOLIN HFA) 108 (90 Base) MCG/ACT inhaler Inhale 1-2 puffs into the lungs every 6 (six) hours as needed for wheezing or shortness of breath. 18 g 0   amLODipine (NORVASC) 5 MG tablet Take 1 tablet (5 mg total) by mouth daily. 30 tablet 0   gabapentin (NEURONTIN) 600 MG tablet Take 600 mg by mouth 3 (three) times daily as needed.     lamoTRIgine (LAMICTAL) 25 MG tablet Take 25 mg by mouth daily.     melatonin 3 MG TABS tablet Take 1 tablet (3 mg total) by mouth at bedtime.  0   metoprolol succinate (TOPROL-XL) 25 MG 24 hr tablet Take 25 mg by mouth daily.     montelukast (SINGULAIR) 10 MG tablet Take 10 mg by mouth daily.     nicotine (NICODERM CQ - DOSED IN MG/24 HOURS) 14 mg/24hr patch Place 1 patch (14 mg total) onto the skin daily as needed (nicotine use). 28 patch 0   omeprazole (PRILOSEC) 20 MG capsule Take by mouth daily.     ondansetron (ZOFRAN) 4 MG tablet Take 1 tablet (4 mg total) by mouth every 6 (six) hours as needed for nausea or vomiting. 30 tablet 1   OXcarbazepine (TRILEPTAL) 150 MG tablet Take 1 tablet (150 mg total) by mouth 2 (two) times daily. 60 tablet 0   oxyCODONE-acetaminophen (PERCOCET/ROXICET) 5-325 MG tablet Take 1 tablet by mouth every 12 (twelve) hours as needed for severe pain. 6 tablet 0   QUEtiapine (SEROQUEL) 25 MG tablet Take 25 mg by mouth at bedtime.     rosuvastatin (CRESTOR) 20 MG tablet Take 20 mg by mouth daily.     sertraline (ZOLOFT) 25 MG tablet Take 3 tablets (75 mg total) by mouth  daily. 90 tablet 0   traZODone (DESYREL) 100 MG tablet Take 100 mg by mouth at bedtime.     triamcinolone ointment (KENALOG) 0.1 % Apply topically 2 (two) times daily.     Varenicline Tartrate, Starter,  0.5 MG X 11 & 1 MG X 42 TBPK Take by mouth.     No current facility-administered medications for this visit.   Allergies  Allergen Reactions   Benadryl [Diphenhydramine]     "heart races"   Penicillins Nausea And Vomiting   Latex Rash     Review of Systems: All systems reviewed and negative except where noted in HPI.    DG Toe Great Left  Result Date: 07/09/2022 CLINICAL DATA:  Toe pain.  Hyperflexion injury. EXAM: LEFT GREAT TOE COMPARISON:  None Available. FINDINGS: Comminuted and minimally displaced fracture of the great toe proximal phalanx. Fracture extends to the interphalangeal joint without significant displacement. The remainder of the toe is intact. There is mild soft tissue edema. IMPRESSION: Comminuted and minimally displaced great toe proximal phalanx fracture extending to the interphalangeal joint. Electronically Signed   By: Keith Rake M.D.   On: 07/09/2022 14:35    Physical Exam: There were no vitals taken for this visit. Constitutional: Pleasant,well-developed, ***female in no acute distress. HEENT: Normocephalic and atraumatic. Conjunctivae are normal. No scleral icterus. Neck supple.  Cardiovascular: Normal rate, regular rhythm.  Pulmonary/chest: Effort normal and breath sounds normal. No wheezing, rales or rhonchi. Abdominal: Soft, nondistended, nontender. Bowel sounds active throughout. There are no masses palpable. No hepatomegaly. Extremities: no edema Lymphadenopathy: No cervical adenopathy noted. Neurological: Alert and oriented to person place and time. Skin: Skin is warm and dry. No rashes noted. Psychiatric: Normal mood and affect. Behavior is normal.   ASSESSMENT: 52 y.o. female here for assessment of the following  No diagnosis found.  PLAN:   No ref. provider found

## 2022-10-24 ENCOUNTER — Ambulatory Visit: Payer: 59 | Admitting: Gastroenterology

## 2022-12-21 ENCOUNTER — Other Ambulatory Visit: Payer: Self-pay

## 2022-12-21 ENCOUNTER — Encounter: Payer: Self-pay | Admitting: Obstetrics and Gynecology

## 2022-12-21 ENCOUNTER — Other Ambulatory Visit (HOSPITAL_COMMUNITY)
Admission: RE | Admit: 2022-12-21 | Discharge: 2022-12-21 | Disposition: A | Discharge status: Home / Self Care | Payer: 59 | Source: Ambulatory Visit | Attending: Obstetrics and Gynecology | Admitting: Obstetrics and Gynecology

## 2022-12-21 ENCOUNTER — Ambulatory Visit (INDEPENDENT_AMBULATORY_CARE_PROVIDER_SITE_OTHER): Payer: 59 | Admitting: Obstetrics and Gynecology

## 2022-12-21 VITALS — BP 133/87 | HR 88 | Wt 184.2 lb

## 2022-12-21 DIAGNOSIS — Z1151 Encounter for screening for human papillomavirus (HPV): Secondary | ICD-10-CM | POA: Diagnosis not present

## 2022-12-21 DIAGNOSIS — N951 Menopausal and female climacteric states: Secondary | ICD-10-CM

## 2022-12-21 DIAGNOSIS — Z124 Encounter for screening for malignant neoplasm of cervix: Secondary | ICD-10-CM | POA: Diagnosis not present

## 2022-12-21 DIAGNOSIS — N3946 Mixed incontinence: Secondary | ICD-10-CM

## 2022-12-21 DIAGNOSIS — Z01419 Encounter for gynecological examination (general) (routine) without abnormal findings: Secondary | ICD-10-CM | POA: Diagnosis present

## 2022-12-21 MED ORDER — TROSPIUM CHLORIDE 20 MG PO TABS: 20.0000 mg | ORAL_TABLET | Freq: Two times a day (BID) | ORAL | 2 refills | Status: DC

## 2022-12-21 NOTE — Progress Notes (Signed)
NEW GYNECOLOGY PATIENT Patient name: [name] MRN [id number]  Date of birth: 1970-09-15 Chief Complaint:   new gyn     History:  [name] is a 52 y.o. No obstetric history on file. being seen today for urinary incontinence and also due for pap .  Will lose urine with cough/sneeze and having urine on underwear when she wakes up in the morning. Can feel when she has the urge. Will have urge and then loss of urine (small and moderate volumes). Urinary issues for about 7-8 months  May have pain when anticipated menses but no menses in the last year  Vaginal dryness: not that she is aware  Not currently sexually active  Hx of UTI: none in the last year Urine frequency present Water: 8oz  Juice: 2 cups Coffee: not daily  Alcohol: none  Cigarette use - daily 4-5 cigarettes a day   No constipation, no glaucoma, no chronic dry mouth or eyses   Having hot flashes, having them all day and frequently overnight as well. Current daily smoker.       Gynecologic History No LMP recorded. Contraception: post menopausal status Last Pap: unknown; hx of abnormal pap smear likely colonoscopy and freezing procedure but it has been more than 10 years ago  Last Mammogram:  Last Colonoscopy: 2023  Obstetric History OB History  Gravida Para Term Preterm AB Living               SAB IAB Ectopic Multiple Live Births          2    Past Medical History:  Diagnosis Date   Acid reflux    Anxiety    Arthritis    Asthma    Depression    Diabetes (HCC)    Drug abuse (HCC)    ETOH abuse    High cholesterol    Hypertension    Manic-depressive disorder (HCC)    PTSD (post-traumatic stress disorder)    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    Past Surgical History:  Procedure Laterality Date   TUBAL LIGATION      Current Outpatient Medications on File Prior to Visit  Medication Sig Dispense Refill   acetaminophen (TYLENOL) 500 MG tablet Take 1 tablet (500 mg total) by mouth in the morning, at  noon, in the evening, and at bedtime. 28 tablet 0   ADVAIR HFA 115-21 MCG/ACT inhaler SMARTSIG:2 Puff(s) By Mouth Every 12 Hours     albuterol (VENTOLIN HFA) 108 (90 Base) MCG/ACT inhaler Inhale 1-2 puffs into the lungs every 6 (six) hours as needed for wheezing or shortness of breath. 18 g 0   gabapentin (NEURONTIN) 600 MG tablet Take 600 mg by mouth 3 (three) times daily as needed.     lamoTRIgine (LAMICTAL) 25 MG tablet Take 25 mg by mouth daily.     melatonin 3 MG TABS tablet Take 1 tablet (3 mg total) by mouth at bedtime.  0   metoprolol succinate (TOPROL-XL) 25 MG 24 hr tablet Take 25 mg by mouth daily.     omeprazole (PRILOSEC) 20 MG capsule Take by mouth daily.     ondansetron (ZOFRAN) 4 MG tablet Take 1 tablet (4 mg total) by mouth every 6 (six) hours as needed for nausea or vomiting. 30 tablet 1   QUEtiapine (SEROQUEL) 25 MG tablet Take 25 mg by mouth at bedtime.     rosuvastatin (CRESTOR) 20 MG tablet Take 20 mg by mouth daily.  traZODone (DESYREL) 100 MG tablet Take 100 mg by mouth at bedtime.     triamcinolone ointment (KENALOG) 0.1 % Apply topically 2 (two) times daily.     Varenicline Tartrate, Starter, 0.5 MG X 11 & 1 MG X 42 TBPK Take by mouth.     amLODipine (NORVASC) 5 MG tablet Take 1 tablet (5 mg total) by mouth daily. 30 tablet 0   montelukast (SINGULAIR) 10 MG tablet Take 10 mg by mouth daily. (Patient not taking: Reported on 12/21/2022)     nicotine (NICODERM CQ - DOSED IN MG/24 HOURS) 14 mg/24hr patch Place 1 patch (14 mg total) onto the skin daily as needed (nicotine use). (Patient not taking: Reported on 12/21/2022) 28 patch 0   OXcarbazepine (TRILEPTAL) 150 MG tablet Take 1 tablet (150 mg total) by mouth 2 (two) times daily. 60 tablet 0   oxyCODONE-acetaminophen (PERCOCET/ROXICET) 5-325 MG tablet Take 1 tablet by mouth every 12 (twelve) hours as needed for severe pain. (Patient not taking: Reported on 12/21/2022) 6 tablet 0   sertraline (ZOLOFT) 25 MG tablet Take 3  tablets (75 mg total) by mouth daily. 90 tablet 0   [DISCONTINUED] clonazePAM (KLONOPIN) 0.5 MG tablet Take 0.5 mg by mouth 3 (three) times daily as needed for anxiety.     [DISCONTINUED] pantoprazole (PROTONIX) 40 MG tablet Take 40 mg by mouth daily.      No current facility-administered medications on file prior to visit.    Allergies  Allergen Reactions   Benadryl [Diphenhydramine]     "heart races"   Penicillins Nausea And Vomiting   Latex Rash    Social History:  reports that she has been smoking cigarettes. She has a 14.50 pack-year smoking history. She has never used smokeless tobacco. She reports current alcohol use. She reports current drug use. Drugs: Cocaine and Marijuana.  Family History  Problem Relation Age of Onset   Diabetes Mother    Hypertension Mother    Heart disease Maternal Grandmother     The following portions of the patient's history were reviewed and updated as appropriate: allergies, current medications, past family history, past medical history, past social history, past surgical history and problem list.  Review of Systems Pertinent items noted in HPI and remainder of comprehensive ROS otherwise negative.  Physical Exam:  BP 133/87   Pulse 88   Wt 184 lb 3.2 oz (83.6 kg)   BMI 32.12 kg/m  Physical Exam Vitals and nursing note reviewed. Exam conducted with a chaperone present.  Constitutional:      Appearance: Normal appearance.  Cardiovascular:     Rate and Rhythm: Normal rate.  Pulmonary:     Effort: Pulmonary effort is normal.     Breath sounds: Normal breath sounds.  Genitourinary:    General: Normal vulva.     Exam position: Lithotomy position.     Vagina: Normal.     Cervix: Normal.     Comments: Negative CST (recently voided) Nontender bilateral levator ani muscles Mild urethral hypermobility  Kegel 3/5  Neurological:     General: No focal deficit present.     Mental Status: She is alert and oriented to person, place, and time.   Psychiatric:        Mood and Affect: Mood normal.        Behavior: Behavior normal.        Thought Content: Thought content normal.        Judgment: Judgment normal.        Assessment  and Plan:   1. Mixed stress and urge urinary incontinence Hx consistent with mixed incontinence. Discussed urge is a function of bladder control and sense of urgency and can be addressed with medication and to some degree PFPT. Stress due to anatomical and pelvic floor issues and initially treated with PFPT. Will check for interactions of UUI meds w/ current meds.  - Ambulatory referral to Physical Therapy  2. Vasomotor symptoms due to menopause Endorses having frequent hot flashes that are bothersome and interested in HRT. Reviewed will need to review labs and meds to calculate CVD risk prior to prescribing HRT.   3. Screening for cervical cancer Routine pap collected    Routine preventative health maintenance measures emphasized. Please refer to After Visit Summary for other counseling recommendations.   Follow-up: No follow-ups on file.      Lorriane Shire, MD Obstetrician & Gynecologist, [facility] [employer], [facility]

## 2022-12-26 LAB — CYTOLOGY - PAP
Adequacy: ABSENT
Comment: NEGATIVE
Diagnosis: UNDETERMINED — AB
High risk HPV: NEGATIVE

## 2022-12-27 ENCOUNTER — Telehealth: Payer: Self-pay | Admitting: Obstetrics and Gynecology

## 2022-12-27 ENCOUNTER — Telehealth: Payer: Self-pay

## 2022-12-27 DIAGNOSIS — N951 Menopausal and female climacteric states: Secondary | ICD-10-CM

## 2022-12-27 MED ORDER — CLIMARA PRO 0.045-0.015 MG/DAY TD PTWK
1.0000 | MEDICATED_PATCH | TRANSDERMAL | 12 refills | Status: DC
Start: 2022-12-27 — End: 2023-04-05

## 2022-12-27 NOTE — Telephone Encounter (Signed)
Pt called requesting results of pap smear and if she is going to get her Rx for menopause.    Leonette Nutting  12/27/22

## 2022-12-27 NOTE — Telephone Encounter (Signed)
Called patient and confirmed ID x2.   Reviewed pap result and recommendation for repeat in 3 years  Also reviewed moderate risk of CVD and that re: HRT, transdermal route recommended.   - We discussed the treatment options for menopause as well as indications - we discussed both HRT and non-HRT.  - Discussed the benefits of each and relative effectiveness.  - Discussed goals of therapy i.e. reduction of hot flashes (not complete resolution). Reviewed full response takes up to 2-3 months, including for hormone therapy. - Discussed if HRT we do shortest amount of time at lowest dose. We discussed annual attempts at coming of the HRT typically in the fall months when the weather has cooled down. - We discussed the differences in modes of therapy for HRT -- patch vs oral therapy.  - Discussed risks of HRT: E+P = breast cancer, clotting, MI/Stroke. Discussed risk of E alone. We reviewed that limits of data from the [facility] regarding breast cancer impact - only progesterone used in that study was provera which is biologically active in the best. We MAY reduce that risk by doing a different progesterone based therapy I.e. norethindrone. We reviewed the indication and necessity for progesterone and that estrogen alone in those with a uterus have an increased risk of endometrial cancer - Discussed genitourinary symptoms i.e. urinary issues, vaginal dryness and dyspareunia and that for these symptoms, local treatment is best. - She would like: HRT

## 2023-01-01 ENCOUNTER — Telehealth: Payer: Self-pay | Admitting: Obstetrics and Gynecology

## 2023-01-01 NOTE — Telephone Encounter (Signed)
Spoke to patient about her follow-up appointment.

## 2023-01-01 NOTE — Telephone Encounter (Signed)
-----   Message from Lorriane Shire, MD sent at 12/27/2022  4:39 PM EDT ----- Regarding: follow up Hello,  Please schedule follow up in 6-8 weeks to assess response to hormone replacement.  Thank you,  Ajewole

## 2023-01-31 ENCOUNTER — Ambulatory Visit: Payer: 59 | Admitting: Obstetrics and Gynecology

## 2023-02-05 ENCOUNTER — Encounter (HOSPITAL_COMMUNITY): Payer: Self-pay

## 2023-02-05 ENCOUNTER — Emergency Department (HOSPITAL_COMMUNITY)
Admission: EM | Admit: 2023-02-05 | Discharge: 2023-02-05 | Disposition: A | Discharge status: Home / Self Care | Payer: 59 | Attending: Emergency Medicine | Admitting: Emergency Medicine

## 2023-02-05 ENCOUNTER — Other Ambulatory Visit: Payer: Self-pay

## 2023-02-05 DIAGNOSIS — J45909 Unspecified asthma, uncomplicated: Secondary | ICD-10-CM | POA: Diagnosis not present

## 2023-02-05 DIAGNOSIS — E119 Type 2 diabetes mellitus without complications: Secondary | ICD-10-CM | POA: Diagnosis not present

## 2023-02-05 DIAGNOSIS — I10 Essential (primary) hypertension: Secondary | ICD-10-CM | POA: Diagnosis not present

## 2023-02-05 DIAGNOSIS — M5431 Sciatica, right side: Secondary | ICD-10-CM

## 2023-02-05 DIAGNOSIS — M5441 Lumbago with sciatica, right side: Secondary | ICD-10-CM | POA: Insufficient documentation

## 2023-02-05 DIAGNOSIS — M545 Low back pain, unspecified: Secondary | ICD-10-CM | POA: Diagnosis present

## 2023-02-05 LAB — URINALYSIS, ROUTINE W REFLEX MICROSCOPIC
Bilirubin Urine: NEGATIVE
Glucose, UA: NEGATIVE mg/dL
Hgb urine dipstick: NEGATIVE
Ketones, ur: NEGATIVE mg/dL
Leukocytes,Ua: NEGATIVE
Nitrite: NEGATIVE
Protein, ur: NEGATIVE mg/dL
Specific Gravity, Urine: 1.02 (ref 1.005–1.030)
pH: 5 (ref 5.0–8.0)

## 2023-02-05 LAB — CBC WITH DIFFERENTIAL/PLATELET
Abs Immature Granulocytes: 0.02 10*3/uL (ref 0.00–0.07)
Basophils Absolute: 0 10*3/uL (ref 0.0–0.1)
Basophils Relative: 0 %
Eosinophils Absolute: 0.1 10*3/uL (ref 0.0–0.5)
Eosinophils Relative: 1 %
HCT: 38.9 % (ref 36.0–46.0)
Hemoglobin: 12.7 g/dL (ref 12.0–15.0)
Immature Granulocytes: 0 %
Lymphocytes Relative: 31 %
Lymphs Abs: 3 10*3/uL (ref 0.7–4.0)
MCH: 28.4 pg (ref 26.0–34.0)
MCHC: 32.6 g/dL (ref 30.0–36.0)
MCV: 87 fL (ref 80.0–100.0)
Monocytes Absolute: 0.4 10*3/uL (ref 0.1–1.0)
Monocytes Relative: 4 %
Neutro Abs: 6.2 10*3/uL (ref 1.7–7.7)
Neutrophils Relative %: 64 %
Platelets: 267 10*3/uL (ref 150–400)
RBC: 4.47 MIL/uL (ref 3.87–5.11)
RDW: 16.1 % — ABNORMAL HIGH (ref 11.5–15.5)
WBC: 9.8 10*3/uL (ref 4.0–10.5)
nRBC: 0 % (ref 0.0–0.2)

## 2023-02-05 LAB — COMPREHENSIVE METABOLIC PANEL
ALT: 29 U/L (ref 0–44)
AST: 26 U/L (ref 15–41)
Albumin: 3.7 g/dL (ref 3.5–5.0)
Alkaline Phosphatase: 117 U/L (ref 38–126)
Anion gap: 12 (ref 5–15)
BUN: 13 mg/dL (ref 6–20)
CO2: 25 mmol/L (ref 22–32)
Calcium: 9.4 mg/dL (ref 8.9–10.3)
Chloride: 101 mmol/L (ref 98–111)
Creatinine, Ser: 0.7 mg/dL (ref 0.44–1.00)
GFR, Estimated: >60 mL/min (ref >60)
Glucose, Bld: 112 mg/dL — ABNORMAL HIGH (ref 70–99)
Potassium: 3.5 mmol/L (ref 3.5–5.1)
Sodium: 138 mmol/L (ref 135–145)
Total Bilirubin: 0.3 mg/dL (ref 0.3–1.2)
Total Protein: 7.9 g/dL (ref 6.5–8.1)

## 2023-02-05 LAB — HCG, SERUM, QUALITATIVE: Preg, Serum: NEGATIVE

## 2023-02-05 MED ORDER — ACETAMINOPHEN 500 MG PO TABS
1000.0000 mg | ORAL_TABLET | Freq: Once | ORAL | Status: AC
  Administered 2023-02-05: 1000 mg via ORAL
  Filled 2023-02-05: qty 2

## 2023-02-05 MED ORDER — LIDOCAINE 5 % EX PTCH: 1.0000 | MEDICATED_PATCH | CUTANEOUS | 0 refills | Status: DC

## 2023-02-05 MED ORDER — METHOCARBAMOL 500 MG PO TABS: 500.0000 mg | ORAL_TABLET | Freq: Two times a day (BID) | ORAL | 0 refills | Status: DC

## 2023-02-05 MED ORDER — METHOCARBAMOL 500 MG PO TABS
500.0000 mg | ORAL_TABLET | Freq: Once | ORAL | Status: AC
  Administered 2023-02-05: 500 mg via ORAL
  Filled 2023-02-05: qty 1

## 2023-02-05 MED ORDER — LIDOCAINE 5 % EX PTCH
1.0000 | MEDICATED_PATCH | CUTANEOUS | Status: DC
  Administered 2023-02-05: 1 via TRANSDERMAL
  Filled 2023-02-05: qty 1

## 2023-02-05 NOTE — ED Provider Notes (Signed)
[place] EMERGENCY DEPARTMENT AT [facility] Provider Note  MDM   HPI/ROS:  [name] is a 52 y.o. female past medical history of hypertension, hyperlipidemia, depression, anxiety, drug abuse presenting to the emergency department with chief complaint of lower back pain, abdominal pain, leg pain.  Symptoms started yesterday with right-sided lower back pain that then began to radiate around to her abdomen and down her right leg.  She is also experienced some degree of paresthesias along her right leg with this pain.  Claims the pain is worse when she moves around.  Denies fevers, saddle anesthesia, bowel or bladder incontinence, IV drug use, history of cancer.  Patient does remark that she has had urinary frequency that has been going on for quite some time now, longer timeline and the symptoms she is presenting for today.  Claims she takes a medication for urinary frequency/urgency.  She denies dysuria, hematuria.  Physical exam is notable for: - Overall well-appearing, no acute distress - No significant tenderness to palpation of abdomen - Straight leg test negative - Neurologic exam normal in bilateral lower extremities  On my initial evaluation, patient is:  -Vital signs stable. Patient afebrile, hemodynamically stable, and non-toxic appearing.  Given the patient's history and physical exam, differential diagnosis includes but is not limited to musculoskeletal pain, sciatica, urinary tract infection, nephrolithiasis, infectious vertebral process, conus medullaris/cauda equina, etc.  Exceedingly low concern for infectious vertebral process given the patient has no midline back tenderness, she has no red flag symptoms for lower back pain, has had no fevers, no history of IV drug use.  Exceedingly low suspicion for severe or emergent spinal cord pathology as patient is ambulatory without gait instability or assistance, no saddle anesthesia, bowel or bladder  incontinence.  Interpretations, interventions, and the patient's course of care are documented below.    Initial workup to include CBC, CMP, hCG, urinalysis.  Robaxin, Tylenol, Lidoderm patch administered for pain control.  Lab resulted with no abnormalities, overall reassuring.  Normal renal function and electrolytes, no leukocytosis or anemia on CBC, urinalysis without blood or signs of infection.  Given that abdominal exam is benign and there is low concern for severe intra-abdominal process, CT imaging of the abdomen is deferred at this time.  Upon reassessment, patient resting comfortably with stable vital signs.  Remains afebrile, normotensive, not tachycardic.  Satting well on room air.  Given the patient has remained stable throughout emergency department visit, workup is largely reassuring, and patient is now feeling better after medications administered, most likely represents musculoskeletal lower back pain versus sciatica.  Discharged with prescription for Robaxin.  Instructed to take as prescribed.  Instructed to follow-up with primary care provider.  Strict return precautions discussed.  Disposition:  I discussed the plan for discharge with the patient and/or their surrogate at bedside prior to discharge and they were in agreement with the plan and verbalized understanding of the return precautions provided. All questions answered to the best of my ability. Ultimately, the patient was discharged in stable condition with stable vital signs. I am reassured that they are capable of close follow up and good social support at home.   Clinical Impression:  1. Sciatica of right side     Rx / DC Orders ED Discharge Orders          Ordered    methocarbamol (ROBAXIN) 500 MG tablet  2 times daily        02/05/23 2051    lidocaine (LIDODERM) 5 %  Every 24 hours        02/05/23 2051            The plan for this patient was discussed with Dr. Jodi Mourning, who voiced agreement and who  oversaw evaluation and treatment of this patient.   Clinical Complexity A medically appropriate history, review of systems, and physical exam was performed.  My independent interpretations of EKG, labs, and radiology are documented in the ED course above.   Click here for ABCD2, HEART and other calculatorsREFRESH Note before signing   Patient's presentation is most consistent with acute complicated illness / injury requiring diagnostic workup.  Medical Decision Making Amount and/or Complexity of Data Reviewed Labs: ordered.  Risk OTC drugs. Prescription drug management.    HPI/ROS      See MDM section for pertinent HPI and ROS. A complete ROS was performed with pertinent positives/negatives noted above.   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  Constitutional:      General: She is not in acute distress.    Appearance: She is well-developed.  HENT:     Head: Normocephalic and atraumatic.  Eyes:     Conjunctiva/sclera: Conjunctivae normal.  Cardiovascular:     Rate and Rhythm: Normal rate and regular rhythm.     Heart sounds: No murmur heard. Pulmonary:     Effort: Pulmonary effort is normal. No respiratory distress.     Breath sounds: Normal breath sounds.  Abdominal:     Palpations: Abdomen is soft.     Tenderness: There is  no abdominal tenderness.  Musculoskeletal:        General: No swelling.     Cervical back: Neck supple.  Skin:    General: Skin is warm and dry.     Capillary Refill: Capillary refill takes less than 2 seconds.  Neurological:     General: No focal deficit present.     Mental Status: She is alert and oriented to person, place, and time.     Sensory: No sensory deficit.     Motor: No weakness.  Psychiatric:        Mood and Affect: Mood normal.    Dawn Arms, MD Department of Emergency Medicine   Please note that this documentation was produced with the assistance of voice-to-text technology and may contain errors.    Dyanne Iha, MD 02/06/23 Darden Palmer, MD 02/06/23 [phone]

## 2023-02-05 NOTE — ED Notes (Signed)
Pt was able to walk to restroom w/o difficulty.

## 2023-02-05 NOTE — Discharge Instructions (Addendum)
You were seen today for back pain/abdominal pain. While you were here we monitored your vitals, preformed a physical exam, and checked blood work and urine. These were all reassuring and there is no indication for any further testing or intervention in the emergency department at this time.  We have also given you some medications to help with your pain.  Things to do:  - Follow up with your primary care provider within the next 1-2 weeks -Take your new medications as prescribed.  Return to the emergency department if you have any new or worsening symptoms including numbness in your groin area, urinary or bowel incontinence, fevers, chest pain, shortness of breath, worsening numbness or tingling, or if you have any other concerns.

## 2023-02-05 NOTE — ED Triage Notes (Signed)
Pt came in via POV d/t 3 days ago feeling pain in her Rt side of lower back that traveled around to her lower abd & then will shoot down her Rt leg & if laying down in bed her Rt leg feels like it will go numb. Denies any recent falls/injures or heavy lifting. A/Ox4, pain is 10/10.

## 2023-02-07 ENCOUNTER — Ambulatory Visit: Payer: 59

## 2023-03-30 ENCOUNTER — Other Ambulatory Visit: Payer: Self-pay | Admitting: Obstetrics and Gynecology

## 2023-03-30 DIAGNOSIS — N3946 Mixed incontinence: Secondary | ICD-10-CM

## 2023-04-04 ENCOUNTER — Emergency Department (HOSPITAL_COMMUNITY): Payer: 59

## 2023-04-04 ENCOUNTER — Encounter (HOSPITAL_COMMUNITY): Payer: Self-pay

## 2023-04-04 ENCOUNTER — Other Ambulatory Visit: Payer: Self-pay

## 2023-04-04 ENCOUNTER — Observation Stay (HOSPITAL_COMMUNITY)
Admission: EM | Admit: 2023-04-04 | Discharge: 2023-04-05 | Disposition: A | Discharge status: Home / Self Care | Payer: 59 | Attending: Cardiology | Admitting: Cardiology

## 2023-04-04 DIAGNOSIS — E1169 Type 2 diabetes mellitus with other specified complication: Secondary | ICD-10-CM | POA: Diagnosis not present

## 2023-04-04 DIAGNOSIS — F17210 Nicotine dependence, cigarettes, uncomplicated: Secondary | ICD-10-CM | POA: Diagnosis not present

## 2023-04-04 DIAGNOSIS — Z91040 Latex allergy status: Secondary | ICD-10-CM | POA: Insufficient documentation

## 2023-04-04 DIAGNOSIS — E785 Hyperlipidemia, unspecified: Secondary | ICD-10-CM

## 2023-04-04 DIAGNOSIS — J45909 Unspecified asthma, uncomplicated: Secondary | ICD-10-CM | POA: Insufficient documentation

## 2023-04-04 DIAGNOSIS — I10 Essential (primary) hypertension: Secondary | ICD-10-CM | POA: Diagnosis present

## 2023-04-04 DIAGNOSIS — R079 Chest pain, unspecified: Secondary | ICD-10-CM | POA: Diagnosis present

## 2023-04-04 DIAGNOSIS — F32A Depression, unspecified: Secondary | ICD-10-CM | POA: Diagnosis present

## 2023-04-04 DIAGNOSIS — Z79899 Other long term (current) drug therapy: Secondary | ICD-10-CM | POA: Insufficient documentation

## 2023-04-04 DIAGNOSIS — I2089 Other forms of angina pectoris: Secondary | ICD-10-CM | POA: Diagnosis not present

## 2023-04-04 DIAGNOSIS — E119 Type 2 diabetes mellitus without complications: Secondary | ICD-10-CM | POA: Diagnosis not present

## 2023-04-04 DIAGNOSIS — F332 Major depressive disorder, recurrent severe without psychotic features: Secondary | ICD-10-CM | POA: Diagnosis present

## 2023-04-04 DIAGNOSIS — I200 Unstable angina: Secondary | ICD-10-CM | POA: Diagnosis present

## 2023-04-04 DIAGNOSIS — F17200 Nicotine dependence, unspecified, uncomplicated: Secondary | ICD-10-CM | POA: Diagnosis present

## 2023-04-04 DIAGNOSIS — Z955 Presence of coronary angioplasty implant and graft: Secondary | ICD-10-CM

## 2023-04-04 LAB — CBC
HCT: 38.1 % (ref 36.0–46.0)
HCT: 38.2 % (ref 36.0–46.0)
Hemoglobin: 12.3 g/dL (ref 12.0–15.0)
Hemoglobin: 12.2 g/dL (ref 12.0–15.0)
MCH: 28.3 pg (ref 26.0–34.0)
MCH: 28.2 pg (ref 26.0–34.0)
MCHC: 32.3 g/dL (ref 30.0–36.0)
MCHC: 31.9 g/dL (ref 30.0–36.0)
MCV: 87.8 fL (ref 80.0–100.0)
MCV: 88.4 fL (ref 80.0–100.0)
Platelets: 280 10*3/uL (ref 150–400)
Platelets: 256 10*3/uL (ref 150–400)
RBC: 4.34 MIL/uL (ref 3.87–5.11)
RBC: 4.32 MIL/uL (ref 3.87–5.11)
RDW: 16.1 % — ABNORMAL HIGH (ref 11.5–15.5)
RDW: 16.5 % — ABNORMAL HIGH (ref 11.5–15.5)
WBC: 7.9 10*3/uL (ref 4.0–10.5)
WBC: 7.9 10*3/uL (ref 4.0–10.5)
nRBC: 0 % (ref 0.0–0.2)
nRBC: 0 % (ref 0.0–0.2)

## 2023-04-04 LAB — TROPONIN I (HIGH SENSITIVITY)
Troponin I (High Sensitivity): 7 ng/L (ref <18)
Troponin I (High Sensitivity): 7 ng/L (ref <18)

## 2023-04-04 LAB — RAPID URINE DRUG SCREEN, HOSP PERFORMED
Amphetamines: NOT DETECTED
Barbiturates: NOT DETECTED
Benzodiazepines: NOT DETECTED
Cocaine: NOT DETECTED
Opiates: NOT DETECTED
Tetrahydrocannabinol: NOT DETECTED

## 2023-04-04 LAB — COMPREHENSIVE METABOLIC PANEL
ALT: 46 U/L — ABNORMAL HIGH (ref 0–44)
AST: 46 U/L — ABNORMAL HIGH (ref 15–41)
Albumin: 3.6 g/dL (ref 3.5–5.0)
Alkaline Phosphatase: 94 U/L (ref 38–126)
Anion gap: 9 (ref 5–15)
BUN: 12 mg/dL (ref 6–20)
CO2: 26 mmol/L (ref 22–32)
Calcium: 9.8 mg/dL (ref 8.9–10.3)
Chloride: 103 mmol/L (ref 98–111)
Creatinine, Ser: 0.98 mg/dL (ref 0.44–1.00)
GFR, Estimated: >60 mL/min (ref >60)
Glucose, Bld: 104 mg/dL — ABNORMAL HIGH (ref 70–99)
Potassium: 4.1 mmol/L (ref 3.5–5.1)
Sodium: 138 mmol/L (ref 135–145)
Total Bilirubin: <0.1 mg/dL — ABNORMAL LOW (ref 0.3–1.2)
Total Protein: 7.7 g/dL (ref 6.5–8.1)

## 2023-04-04 LAB — PROTIME-INR
INR: 0.9 (ref 0.8–1.2)
Prothrombin Time: 12.4 s (ref 11.4–15.2)

## 2023-04-04 LAB — HCG, SERUM, QUALITATIVE: Preg, Serum: NEGATIVE

## 2023-04-04 LAB — CREATININE, SERUM
Creatinine, Ser: 0.82 mg/dL (ref 0.44–1.00)
GFR, Estimated: >60 mL/min (ref >60)

## 2023-04-04 LAB — HIV ANTIBODY (ROUTINE TESTING W REFLEX): HIV Screen 4th Generation wRfx: NONREACTIVE

## 2023-04-04 LAB — D-DIMER, QUANTITATIVE: D-Dimer, Quant: 2.67 ug{FEU}/mL — ABNORMAL HIGH (ref 0.00–0.50)

## 2023-04-04 MED ORDER — NITROGLYCERIN 0.4 MG SL SUBL: 0.4000 mg | SUBLINGUAL_TABLET | SUBLINGUAL | Status: DC | PRN

## 2023-04-04 MED ORDER — ASPIRIN 81 MG PO TBEC
81.0000 mg | DELAYED_RELEASE_TABLET | Freq: Every day | ORAL | Status: DC
  Administered 2023-04-05: 81 mg via ORAL
  Filled 2023-04-04: qty 1

## 2023-04-04 MED ORDER — ALBUTEROL SULFATE (2.5 MG/3ML) 0.083% IN NEBU: 2.5000 mg | INHALATION_SOLUTION | Freq: Four times a day (QID) | RESPIRATORY_TRACT | Status: DC | PRN

## 2023-04-04 MED ORDER — METOPROLOL SUCCINATE ER 25 MG PO TB24
25.0000 mg | ORAL_TABLET | Freq: Every day | ORAL | Status: DC
  Administered 2023-04-05: 25 mg via ORAL
  Filled 2023-04-04: qty 1

## 2023-04-04 MED ORDER — ALBUTEROL SULFATE HFA 108 (90 BASE) MCG/ACT IN AERS: 1.0000 | INHALATION_SPRAY | Freq: Four times a day (QID) | RESPIRATORY_TRACT | Status: DC | PRN

## 2023-04-04 MED ORDER — ENOXAPARIN SODIUM 40 MG/0.4ML IJ SOSY
40.0000 mg | PREFILLED_SYRINGE | INTRAMUSCULAR | Status: DC
  Administered 2023-04-04: 40 mg via SUBCUTANEOUS
  Filled 2023-04-04: qty 0.4

## 2023-04-04 MED ORDER — ONDANSETRON HCL 4 MG PO TABS: 4.0000 mg | ORAL_TABLET | Freq: Four times a day (QID) | ORAL | Status: DC | PRN

## 2023-04-04 MED ORDER — PANTOPRAZOLE SODIUM 40 MG PO TBEC
40.0000 mg | DELAYED_RELEASE_TABLET | Freq: Every day | ORAL | Status: DC
  Administered 2023-04-04 – 2023-04-05 (×2): 40 mg via ORAL
  Filled 2023-04-04 (×2): qty 1

## 2023-04-04 MED ORDER — IOHEXOL 350 MG/ML SOLN
75.0000 mL | Freq: Once | INTRAVENOUS | Status: AC | PRN
  Administered 2023-04-04: 75 mL via INTRAVENOUS

## 2023-04-04 MED ORDER — ACETAMINOPHEN 325 MG PO TABS: 650.0000 mg | ORAL_TABLET | ORAL | Status: DC | PRN

## 2023-04-04 MED ORDER — SODIUM CHLORIDE 0.9 % WEIGHT BASED INFUSION
1.0000 mL/kg/h | INTRAVENOUS | Status: DC
  Administered 2023-04-05: 0.999 mL/kg/h via INTRAVENOUS

## 2023-04-04 MED ORDER — ATORVASTATIN CALCIUM 40 MG PO TABS
40.0000 mg | ORAL_TABLET | Freq: Every day | ORAL | Status: DC
  Administered 2023-04-04 – 2023-04-05 (×2): 40 mg via ORAL
  Filled 2023-04-04 (×2): qty 1

## 2023-04-04 MED ORDER — ESCITALOPRAM OXALATE 10 MG PO TABS
10.0000 mg | ORAL_TABLET | Freq: Every day | ORAL | Status: DC
  Administered 2023-04-05: 10 mg via ORAL
  Filled 2023-04-04: qty 1

## 2023-04-04 MED ORDER — GABAPENTIN 300 MG PO CAPS
600.0000 mg | ORAL_CAPSULE | Freq: Three times a day (TID) | ORAL | Status: DC
  Administered 2023-04-04 – 2023-04-05 (×3): 600 mg via ORAL
  Filled 2023-04-04 (×3): qty 2

## 2023-04-04 MED ORDER — ONDANSETRON HCL 4 MG/2ML IJ SOLN: 4.0000 mg | Freq: Four times a day (QID) | INTRAMUSCULAR | Status: DC | PRN

## 2023-04-04 MED ORDER — SODIUM CHLORIDE 0.9 % WEIGHT BASED INFUSION
3.0000 mL/kg/h | INTRAVENOUS | Status: DC
  Administered 2023-04-05: 3 mL/kg/h via INTRAVENOUS

## 2023-04-04 MED ORDER — TRAZODONE HCL 50 MG PO TABS
100.0000 mg | ORAL_TABLET | Freq: Every day | ORAL | Status: DC
  Administered 2023-04-04: 100 mg via ORAL
  Filled 2023-04-04: qty 2

## 2023-04-04 MED ORDER — LURASIDONE HCL 40 MG PO TABS: 40.0000 mg | ORAL_TABLET | Freq: Every evening | ORAL | Status: DC

## 2023-04-04 NOTE — ED Notes (Signed)
ED TO INPATIENT HANDOFF REPORT  ED Nurse Name and Phone #: Keymon Mcelroy [phone]  S Name/Age/Gender [name] 52 y.o. female Room/Bed: 042C/042C  Code Status   Code Status: Full Code  Home/SNF/Other Home Patient oriented to: self, place, time, and situation Is this baseline? Yes   Triage Complete: Triage complete  Chief Complaint Progressive angina (HCC) [I20.0]  Triage Note [facility] from doctor office d/t CP x2 weeks, worse with exertion. Denies cardiac hx. PCP noticed EKG changes. EMS gave ASA 324mg  PO. 20g to L hand.   Allergies Allergies  Allergen Reactions   Benadryl [Diphenhydramine]     "heart races"   Penicillins Nausea And Vomiting   Latex Rash    Level of Care/Admitting Diagnosis ED Disposition     ED Disposition  Admit   Condition  --   Comment  Hospital Area: [facility] [[id number]]  Level of Care: Telemetry Cardiac [[id number]]  May place patient in observation at [facility] or [facility] if equivalent level of care is available:: No  Covid Evaluation: Asymptomatic - no recent exposure (last 10 days) testing not required  Diagnosis: Progressive angina [facility]) [[id number]]  Admitting Physician: Arlyss Gandy  Attending Physician: Arlyss Gandy          B Medical/Surgery History Past Medical History:  Diagnosis Date   Acid reflux    Anxiety    Arthritis    Asthma    Depression    Diabetes (HCC)    Drug abuse (HCC)    ETOH abuse    High cholesterol    Hypertension    Manic-depressive disorder (HCC)    PTSD (post-traumatic stress disorder)    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   Past Surgical History:  Procedure Laterality Date   TUBAL LIGATION       A IV Location/Drains/Wounds Patient Lines/Drains/Airways Status     Active Line/Drains/Airways     Name Placement date Placement time Site Days   Peripheral IV 04/04/23 20 G Left;Posterior Hand 04/04/23  --  Hand  less than 1   Peripheral IV 04/04/23 18 G  Anterior;Proximal;Right Forearm 04/04/23  1258  Forearm  less than 1            Intake/Output Last 24 hours No intake or output data in the 24 hours ending 04/04/23 1750  Labs/Imaging Results for orders placed or performed during the hospital encounter of 04/04/23 (from the past 48 hour(s))  CBC     Status: Abnormal   Collection Time: 04/04/23 11:04 AM  Result Value Ref Range   WBC 7.9 4.0 - 10.5 K/uL   RBC 4.34 3.87 - 5.11 MIL/uL   Hemoglobin 12.3 12.0 - 15.0 g/dL   HCT 29.5 62.1 - 30.8 %   MCV 87.8 80.0 - 100.0 fL   MCH 28.3 26.0 - 34.0 pg   MCHC 32.3 30.0 - 36.0 g/dL   RDW 65.7 (H) 84.6 - 96.2 %   Platelets 280 150 - 400 K/uL   nRBC 0.0 0.0 - 0.2 %    Comment: Performed at [facility], [address], [place], [place] [address]  Troponin I (High Sensitivity)     Status: None   Collection Time: 04/04/23 11:04 AM  Result Value Ref Range   Troponin I (High Sensitivity) 7 <18 ng/L    Comment: (NOTE) Elevated high sensitivity troponin I (hsTnI) values and significant  changes across serial measurements may suggest ACS but many other  chronic and  acute conditions are known to elevate hsTnI results.  Refer to the "Links" section for chest pain algorithms and additional  guidance. Performed at [facility], [address], [place], [place] [address]   hCG, serum, qualitative     Status: None   Collection Time: 04/04/23 11:04 AM  Result Value Ref Range   Preg, Serum NEGATIVE NEGATIVE    Comment:        THE SENSITIVITY OF THIS METHODOLOGY IS >10 mIU/mL. Performed at [facility], [address], [place], [place] [address]   Comprehensive metabolic panel     Status: Abnormal   Collection Time: 04/04/23 11:04 AM  Result Value Ref Range   Sodium 138 135 - 145 mmol/L   Potassium 4.1 3.5 - 5.1 mmol/L   Chloride 103 98 - 111 mmol/L   CO2 26 22 - 32 mmol/L   Glucose, Bld 104 (H) 70 - 99 mg/dL    Comment: Glucose reference range applies only to samples  taken after fasting for at least 8 hours.   BUN 12 6 - 20 mg/dL   Creatinine, Ser 0.86 0.44 - 1.00 mg/dL   Calcium 9.8 8.9 - 57.8 mg/dL   Total Protein 7.7 6.5 - 8.1 g/dL   Albumin 3.6 3.5 - 5.0 g/dL   AST 46 (H) 15 - 41 U/L   ALT 46 (H) 0 - 44 U/L   Alkaline Phosphatase 94 38 - 126 U/L   Total Bilirubin <0.1 (L) 0.3 - 1.2 mg/dL   GFR, Estimated >46 >96 mL/min    Comment: (NOTE) Calculated using the CKD-EPI Creatinine Equation (2021)    Anion gap 9 5 - 15    Comment: Performed at [facility], [address] [place], [place] [address]  Protime-INR     Status: None   Collection Time: 04/04/23 11:04 AM  Result Value Ref Range   Prothrombin Time 12.4 11.4 - 15.2 seconds   INR 0.9 0.8 - 1.2    Comment: (NOTE) INR goal varies based on device and disease states. Performed at [facility], [address], [place], [place] [address]   D-dimer, quantitative     Status: Abnormal   Collection Time: 04/04/23 11:04 AM  Result Value Ref Range   D-Dimer, Quant 2.67 (H) 0.00 - 0.50 ug/mL-FEU    Comment: (NOTE) At the manufacturer cut-off value of 0.5 g/mL FEU, this assay has a negative predictive value of 95-100%.This assay is intended for use in conjunction with a clinical pretest probability (PTP) assessment model to exclude pulmonary embolism (PE) and deep venous thrombosis (DVT) in outpatients suspected of PE or DVT. Results should be correlated with clinical presentation. Performed at [facility], [address] [place], [place] [address]   Troponin I (High Sensitivity)     Status: None   Collection Time: 04/04/23 12:58 PM  Result Value Ref Range   Troponin I (High Sensitivity) 7 <18 ng/L    Comment: (NOTE) Elevated high sensitivity troponin I (hsTnI) values and significant  changes across serial measurements may suggest ACS but many other  chronic and acute conditions are known to elevate hsTnI results.  Refer to the "Links" section for chest pain  algorithms and additional  guidance. Performed at [facility], [address], [place], [place] [address]   Rapid urine drug screen (hospital performed)     Status: None   Collection Time: 04/04/23  4:56 PM  Result Value Ref Range  Opiates NONE DETECTED NONE DETECTED   Cocaine NONE DETECTED NONE DETECTED   Benzodiazepines NONE DETECTED NONE DETECTED   Amphetamines NONE DETECTED NONE DETECTED   Tetrahydrocannabinol NONE DETECTED NONE DETECTED   Barbiturates NONE DETECTED NONE DETECTED    Comment: (NOTE) DRUG SCREEN FOR MEDICAL PURPOSES ONLY.  IF CONFIRMATION IS NEEDED FOR ANY PURPOSE, NOTIFY LAB WITHIN 5 DAYS.  LOWEST DETECTABLE LIMITS FOR URINE DRUG SCREEN Drug Class                     Cutoff (ng/mL) Amphetamine and metabolites    1000 Barbiturate and metabolites    200 Benzodiazepine                 200 Opiates and metabolites        300 Cocaine and metabolites        300 THC                            50 Performed at [facility], [address], [place], [place] [address]    CT Angio Chest PE W and/or Wo Contrast  Result Date: 04/04/2023 CLINICAL DATA:  Two-week history of chest pain, worse with exertion. EXAM: CT ANGIOGRAPHY CHEST WITH CONTRAST TECHNIQUE: Multidetector CT imaging of the chest was performed using the standard protocol during bolus administration of intravenous contrast. Multiplanar CT image reconstructions and MIPs were obtained to evaluate the vascular anatomy. RADIATION DOSE REDUCTION: This exam was performed according to the departmental dose-optimization program which includes automated exposure control, adjustment of the mA and/or kV according to patient size and/or use of iterative reconstruction technique. CONTRAST:  75mL OMNIPAQUE IOHEXOL 350 MG/ML SOLN COMPARISON:  Same day chest radiograph FINDINGS: Cardiovascular: The study is high quality for the evaluation of pulmonary embolism. There are no filling defects in the central, lobar,  segmental or subsegmental pulmonary artery branches to suggest acute pulmonary embolism. Anatomic variant common origin of the brachiocephalic and common carotid arteries. normal heart size. No significant pericardial fluid/thickening. Coronary artery calcifications and aortic atherosclerosis. Mediastinum/Nodes: Imaged thyroid gland without nodules meeting criteria for imaging follow-up by size. Normal esophagus. 1.1 cm subcarinal lymph node (5:56). Lungs/Pleura: The central airways are patent. Apical predominant paraseptal emphysema. Mild diffuse bronchial wall thickening. Scattered subsegmental mucous plugging in the right upper and bilateral lower lobes. Superior segment left lower lobe nodule measures 8 x 7 mm (6:41). No pneumothorax. No pleural effusion. Upper abdomen: Cholelithiasis. Musculoskeletal: No acute or abnormal lytic or blastic osseous lesions. Multilevel degenerative changes of the thoracic spine. Review of the MIP images confirms the above findings. IMPRESSION: 1. No evidence of pulmonary embolism. 2. Superior segment left lower lobe nodule measures 8 x 7 mm. Non-contrast chest CT at 6-12 months is recommended. If the nodule is stable at time of repeat CT, then future CT at 18-24 months (from today's scan) is considered optional for low-risk patients, but is recommended for high-risk patients. This recommendation follows the consensus statement: Guidelines for Management of Incidental Pulmonary Nodules Detected on CT Images: From the Fleischner Society 2017; Radiology 2017; 284:228-243. 3. Mild diffuse bronchial wall thickening with scattered subsegmental mucous plugging in the right upper and bilateral lower lobes, which can be seen in the setting of bronchitis. 4. Subcarinal lymphadenopathy, likely reactive. 5. Cholelithiasis. 6. Aortic Atherosclerosis (ICD10-I70.0) and Emphysema (ICD10-J43.9). Coronary artery calcifications. Assessment for potential risk factor modification, dietary therapy or  pharmacologic therapy may be warranted, if  clinically indicated. Electronically Signed   By: Agustin Cree M.D.   On: 04/04/2023 15:25   DG Chest Portable 1 View  Result Date: 04/04/2023 CLINICAL DATA:  Chest pain for 2 weeks EXAM: PORTABLE CHEST 1 VIEW COMPARISON:  05/16/2019 FINDINGS: No pneumothorax or effusion. Borderline cardiopericardial silhouette. Slight prominence of the central vasculature. Overlapping cardiac leads. No edema. Apical pleural thickening IMPRESSION: Borderline size heart.  Mild central vascular congestion. Electronically Signed   By: Karen Kays M.D.   On: 04/04/2023 13:01    Pending Labs Unresulted Labs (From admission, onward)     Start     Ordered   04/11/23 0500  Creatinine, serum  (enoxaparin (LOVENOX)    CrCl >/= 30 ml/min)  Weekly,   R     Comments: while on enoxaparin therapy    04/04/23 1705   04/05/23 0500  Lipoprotein A (LPA)  Tomorrow morning,   R        04/04/23 1705   04/05/23 0500  Basic metabolic panel  Tomorrow morning,   R        04/04/23 1705   04/05/23 0500  Lipid panel  Tomorrow morning,   R        04/04/23 1705   04/05/23 0500  CBC  Tomorrow morning,   R        04/04/23 1705   04/04/23 1704  HIV Antibody (routine testing w rflx)  (HIV Antibody (Routine testing w reflex) panel)  Once,   R        04/04/23 1705   04/04/23 1704  CBC  (enoxaparin (LOVENOX)    CrCl >/= 30 ml/min)  Once,   R       Comments: Baseline for enoxaparin therapy IF NOT ALREADY DRAWN.  Notify MD if PLT < 100 K.    04/04/23 1705   04/04/23 1704  Creatinine, serum  (enoxaparin (LOVENOX)    CrCl >/= 30 ml/min)  Once,   R       Comments: Baseline for enoxaparin therapy IF NOT ALREADY DRAWN.    04/04/23 1705            Vitals/Pain Today's Vitals   04/04/23 1054 04/04/23 1102 04/04/23 1400 04/04/23 1424  BP:  130/77 127/78 (!) 131/97  Pulse:  89 86 87  Resp:  18 20 19   Temp:  98 F (36.7 C)  98 F (36.7 C)  TempSrc:  Oral  Oral  SpO2:  100% 100% 99%  Weight:  87.1 kg     Height: 5\' 3"  (1.6 m)     PainSc:        Isolation Precautions No active isolations  Medications Medications  metoprolol succinate (TOPROL-XL) 24 hr tablet 25 mg (has no administration in time range)  lurasidone (LATUDA) tablet 40 mg (has no administration in time range)  traZODone (DESYREL) tablet 100 mg (has no administration in time range)  escitalopram (LEXAPRO) tablet 10 mg (has no administration in time range)  pantoprazole (PROTONIX) EC tablet 40 mg (has no administration in time range)  ondansetron (ZOFRAN) tablet 4 mg (has no administration in time range)  gabapentin (NEURONTIN) capsule 600 mg (has no administration in time range)  aspirin EC tablet 81 mg (has no administration in time range)  nitroGLYCERIN (NITROSTAT) SL tablet 0.4 mg (has no administration in time range)  acetaminophen (TYLENOL) tablet 650 mg (has no administration in time range)  ondansetron (ZOFRAN) injection 4 mg (has no administration in time range)  enoxaparin (LOVENOX) injection 40  mg (has no administration in time range)  atorvastatin (LIPITOR) tablet 40 mg (has no administration in time range)  albuterol (PROVENTIL) (2.5 MG/3ML) 0.083% nebulizer solution 2.5 mg (has no administration in time range)  iohexol (OMNIPAQUE) 350 MG/ML injection 75 mL (75 mLs Intravenous Contrast Given 04/04/23 1325)    Mobility walks     Focused Assessments Cardiac Assessment Handoff:  Cardiac Rhythm: Normal sinus rhythm Lab Results  Component Value Date   TROPONINI <0.03 11/17/2018   Lab Results  Component Value Date   DDIMER 2.67 (H) 04/04/2023   Does the Patient currently have chest pain? No    R Recommendations: See Admitting Provider Note  Report given to:   Additional Notes:

## 2023-04-04 NOTE — H&P (Cosign Needed)
Cardiology H&P   Patient ID: [name] MRN: [id number]; DOB: 06/27/1970  Admit date: 04/04/2023 Date of Consult: 04/04/2023  PCP:  Estevan Oaks, NP   [facility] Providers Cardiologist:  None      Patient Profile:   [name] is a 52 y.o. female with a hx of major depressive disorder, anxiety disorder, polysubstance abuse/cocaine use, HTN, HLD who is being seen 04/04/2023 for the evaluation of chest pain  History of Present Illness:   [name] is a 52 year old female with above medical history. Patient does not have any past cardiac history and does not follow with a cardiologist.   Patient presented to the ED on 10/9 complaining of chest pain that had been going on for 2 weeks. Reportedly, pain was worse with exertion. Vital signs in the ED showed BP 130/77, HR 89 BPM, oxygen 100% on room air. EKG showed sinus rhythm, HR 74 BPM, nonspecific T wave abnormalities. Labs showed hsTn 7, 7. K 4.1, creatinine 0.98, WBC 7.9, hemoglobin 12.3, platelets 280. CTA chest showed no evidence of PE, left lower lobe nodule measuring 8x7 mm. Did note coronary artery calcifications.   On interview, patient reports that for the past 3 weeks, she has been having episodes of chest pain. Pain initially feels sharp, then feels tight/constricting. Often makes her short of breath. Radiates down her arms. Pain always occurs when she is exerting herself and has always been relieved by rest. She has noticed that symptoms have been happening more frequently. Has a mild cough. Denies significant nausea. No vomiting, fever, chills, ankle edema.   I confirmed in my personal interview that the patient has been having progressively worsening, increasing frequency and more prominent substernal sharp followed by squeezing pain in the chest now with lower levels activity.  Where she used to walk a decent amount, now she will get some discomfort of a flight of stairs or around the block.  She has  not had any resting symptoms. No PND, orthopnea or edema.  She does however have exertional dyspnea associated with the chest discomfort that goes away with rest.  He usually takes 5 to 10 minutes for symptoms to fully abate unless he stops right away.  She feels occasional flip-flopping sensations in the chest but no prolonged rapid irregular heartbeats palpitations.  No syncope or near syncope.  No TIA/amaurosis fugax/CVA symptoms..  No claudication.  Notable cardiac ROS: DM-HLD/HTN, obesity and smoker.  Past Medical History:  Diagnosis Date   Acid reflux    Anxiety    Arthritis    Asthma    Depression    Diabetes (HCC)    Drug abuse (HCC)    ETOH abuse    High cholesterol    Hypertension    Manic-depressive disorder (HCC)    PTSD (post-traumatic stress disorder)    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    Past Surgical History:  Procedure Laterality Date   TUBAL LIGATION       Home Medications:  Prior to Admission medications   Medication Sig Start Date End Date Taking? Authorizing Provider  acetaminophen (TYLENOL) 500 MG tablet Take 1 tablet (500 mg total) by mouth in the morning, at noon, in the evening, and at bedtime. 07/09/22  Yes Derwood Kaplan, MD  ADVAIR [facility] 115-21 MCG/ACT inhaler Inhale 2 puffs into the lungs 2 (two) times daily. 04/03/22  Yes [provider]  albuterol (VENTOLIN HFA) 108 (90 Base) MCG/ACT inhaler Inhale 1-2 puffs into  the lungs every 6 (six) hours as needed for wheezing or shortness of breath. 04/11/21  Yes Lamptey, Britta Mccreedy, MD  escitalopram (LEXAPRO) 10 MG tablet Take 10 mg by mouth daily. 03/28/23  Yes [provider]  fluticasone (FLONASE) 50 MCG/ACT nasal spray Place 1 spray into both nostrils daily. 10/24/22  Yes [provider]  gabapentin (NEURONTIN) 600 MG tablet Take 600 mg by mouth 3 (three) times daily. 04/03/22  Yes [provider]  lidocaine (LIDODERM) 5 % Place 1 patch onto the skin daily. Remove & Discard patch within  12 hours or as directed by MD 02/05/23  Yes Dyanne Iha, MD  lurasidone (LATUDA) 40 MG TABS tablet Take 40 mg by mouth every evening. 03/28/23  Yes [provider]  methocarbamol (ROBAXIN) 500 MG tablet Take 1 tablet (500 mg total) by mouth 2 (two) times daily. 02/05/23  Yes Dyanne Iha, MD  metoprolol succinate (TOPROL-XL) 25 MG 24 hr tablet Take 25 mg by mouth daily. 02/26/22  Yes [provider]  montelukast (SINGULAIR) 10 MG tablet Take 10 mg by mouth daily. 05/25/22  Yes [provider]  omeprazole (PRILOSEC) 20 MG capsule Take 20 mg by mouth daily. 02/07/22  Yes [provider]  ondansetron (ZOFRAN) 4 MG tablet Take 1 tablet (4 mg total) by mouth every 6 (six) hours as needed for nausea or vomiting. 05/05/22  Yes Unk Lightning, PA  OXcarbazepine (TRILEPTAL) 150 MG tablet Take 1 tablet (150 mg total) by mouth 2 (two) times daily. 08/07/21 04/04/23 Yes Pashayan, Mardelle Matte, MD  sertraline (ZOLOFT) 25 MG tablet Take 3 tablets (75 mg total) by mouth daily. 08/07/21 04/04/23 Yes Pashayan, Mardelle Matte, MD  traZODone (DESYREL) 100 MG tablet Take 100 mg by mouth at bedtime. 03/30/22  Yes [provider]  triamcinolone ointment (KENALOG) 0.1 % Apply topically 2 (two) times daily. 03/04/22  Yes [provider]  trospium (SANCTURA) 20 MG tablet TAKE 1 TABLET(20 MG) BY MOUTH TWICE DAILY 03/30/23  Yes Lorriane Shire, MD  Varenicline Tartrate, Starter, 0.5 MG X 11 & 1 MG X 42 TBPK Take by mouth. 06/14/22  Yes [provider]  estradiol-levonorgestrel (CLIMARA PRO) 0.045-0.015 MG/DAY Place 1 patch onto the skin once a week. Patient not taking: Reported on 04/04/2023 12/27/22   Lorriane Shire, MD  nicotine (NICODERM CQ - DOSED IN MG/24 HOURS) 14 mg/24hr patch Place 1 patch (14 mg total) onto the skin daily as needed (nicotine use). Patient not taking: Reported on 12/21/2022 08/06/21   Princess Bruins, DO  clonazePAM (KLONOPIN) 0.5 MG  tablet Take 0.5 mg by mouth 3 (three) times daily as needed for anxiety.  04/06/20  [provider]  pantoprazole (PROTONIX) 40 MG tablet Take 40 mg by mouth daily.  10/23/13 04/06/20  [provider]    Inpatient Medications: Scheduled Meds:  Continuous Infusions:  PRN Meds:   Allergies:    Allergies  Allergen Reactions   Benadryl [Diphenhydramine]     "heart races"   Penicillins Nausea And Vomiting   Latex Rash    Social History:   Social History   Socioeconomic History   Marital status: Divorced    Spouse name: Not on file   Number of children: Not on file   Years of education: Not on file   Highest education level: Not on file  Occupational History   Not on file  Tobacco Use   Smoking status: Every Day    Current packs/day: 0.50    Average  packs/day: 0.5 packs/day for 29.0 years (14.5 ttl pk-yrs)    Types: Cigarettes   Smokeless tobacco: Never   Tobacco comments:    in rehab  Vaping Use   Vaping status: Never Used  Substance and Sexual Activity   Alcohol use: Yes   Drug use: Yes    Types: Cocaine, Marijuana    Comment: crack   Sexual activity: Yes    Birth control/protection: None  Other Topics Concern   Not on file  Social History Narrative   Not on file   Social Determinants of Health   Financial Resource Strain: Not on file  Food Insecurity: Not on file  Transportation Needs: Not on file  Physical Activity: Not on file  Stress: Not on file  Social Connections: Not on file  Intimate Partner Violence: Not on file    Family History:    Family History  Problem Relation Age of Onset   Diabetes Mother    Hypertension Mother    Heart disease Maternal Grandmother      ROS:  Please see the history of present illness.   All other ROS reviewed and negative.     Physical Exam/Data:   Vitals:   04/04/23 1054 04/04/23 1102 04/04/23 1400 04/04/23 1424  BP:  130/77 127/78 (!) 131/97  Pulse:  89 86 87  Resp:  18 20 19   Temp:   98 F (36.7 C)  98 F (36.7 C)  TempSrc:  Oral  Oral  SpO2:  100% 100% 99%  Weight: 87.1 kg     Height: 5\' 3"  (1.6 m)      No intake or output data in the 24 hours ending 04/04/23 1547    04/04/2023   10:54 AM 12/21/2022    9:16 AM 06/14/2022    2:11 PM  Last 3 Weights  Weight (lbs) 192 lb 184 lb 3.2 oz 170 lb  Weight (kg) 87.091 kg 83.553 kg 77.111 kg     Body mass index is 34.01 kg/m.  General:  Well nourished, well developed, in no acute distress. Sitting comfortably in the bed ; somewhat anxious. HEENT: normal Neck: no JVD Vascular: No carotid bruits; Radial pulses 2+ bilaterally Cardiac:  normal S1, S2; RRR; no M/R/G Lungs:  clear to auscultation bilaterally, no wheezing, rhonchi or rales. Normal work of breathing on room air   Abd: soft, nontender Ext: no edema in BLE  Musculoskeletal:  No deformities, BUE and BLE strength normal and equal Skin: warm and dry  Neuro:  CNs 2-12 intact, no focal abnormalities noted Psych:  Normal affect   EKG:  The EKG was personally reviewed and demonstrates:  sinus rhythm, HR 74 BPM, nonspecific T wave abnormalities Telemetry:  Telemetry was personally reviewed and demonstrates:  NSR   Relevant CV Studies:   Laboratory Data:  High Sensitivity Troponin:   Recent Labs  Lab 04/04/23 1104 04/04/23 1258  TROPONINIHS 7 7     Chemistry Recent Labs  Lab 04/04/23 1104  NA 138  K 4.1  CL 103  CO2 26  GLUCOSE 104*  BUN 12  CREATININE 0.98  CALCIUM 9.8  GFRNONAA >60  ANIONGAP 9    Recent Labs  Lab 04/04/23 1104  PROT 7.7  ALBUMIN 3.6  AST 46*  ALT 46*  ALKPHOS 94  BILITOT <0.1*   Lipids No results for input(s): "CHOL", "TRIG", "HDL", "LABVLDL", "LDLCALC", "CHOLHDL" in the last 168 hours.  Hematology Recent Labs  Lab 04/04/23 1104  WBC 7.9  RBC 4.34  HGB 12.3  HCT 38.1  MCV 87.8  MCH 28.3  MCHC 32.3  RDW 16.1*  PLT 280   Thyroid No results for input(s): "TSH", "FREET4" in the last 168 hours.  BNPNo  results for input(s): "BNP", "PROBNP" in the last 168 hours.  DDimer  Recent Labs  Lab 04/04/23 1104  DDIMER 2.67*     Radiology/Studies:  CT Angio Chest PE W and/or Wo Contrast  Result Date: 04/04/2023 CLINICAL DATA:  Two-week history of chest pain, worse with exertion. EXAM: CT ANGIOGRAPHY CHEST WITH CONTRAST TECHNIQUE: Multidetector CT imaging of the chest was performed using the standard protocol during bolus administration of intravenous contrast. Multiplanar CT image reconstructions and MIPs were obtained to evaluate the vascular anatomy. RADIATION DOSE REDUCTION: This exam was performed according to the departmental dose-optimization program which includes automated exposure control, adjustment of the mA and/or kV according to patient size and/or use of iterative reconstruction technique. CONTRAST:  75mL OMNIPAQUE IOHEXOL 350 MG/ML SOLN COMPARISON:  Same day chest radiograph FINDINGS: Cardiovascular: The study is high quality for the evaluation of pulmonary embolism. There are no filling defects in the central, lobar, segmental or subsegmental pulmonary artery branches to suggest acute pulmonary embolism. Anatomic variant common origin of the brachiocephalic and common carotid arteries. normal heart size. No significant pericardial fluid/thickening. Coronary artery calcifications and aortic atherosclerosis. Mediastinum/Nodes: Imaged thyroid gland without nodules meeting criteria for imaging follow-up by size. Normal esophagus. 1.1 cm subcarinal lymph node (5:56). Lungs/Pleura: The central airways are patent. Apical predominant paraseptal emphysema. Mild diffuse bronchial wall thickening. Scattered subsegmental mucous plugging in the right upper and bilateral lower lobes. Superior segment left lower lobe nodule measures 8 x 7 mm (6:41). No pneumothorax. No pleural effusion. Upper abdomen: Cholelithiasis. Musculoskeletal: No acute or abnormal lytic or blastic osseous lesions. Multilevel degenerative  changes of the thoracic spine. Review of the MIP images confirms the above findings. IMPRESSION: 1. No evidence of pulmonary embolism. 2. Superior segment left lower lobe nodule measures 8 x 7 mm. Non-contrast chest CT at 6-12 months is recommended. If the nodule is stable at time of repeat CT, then future CT at 18-24 months (from today's scan) is considered optional for low-risk patients, but is recommended for high-risk patients. This recommendation follows the consensus statement: Guidelines for Management of Incidental Pulmonary Nodules Detected on CT Images: From the Fleischner Society 2017; Radiology 2017; 284:228-243. 3. Mild diffuse bronchial wall thickening with scattered subsegmental mucous plugging in the right upper and bilateral lower lobes, which can be seen in the setting of bronchitis. 4. Subcarinal lymphadenopathy, likely reactive. 5. Cholelithiasis. 6. Aortic Atherosclerosis (ICD10-I70.0) and Emphysema (ICD10-J43.9). Coronary artery calcifications. Assessment for potential risk factor modification, dietary therapy or pharmacologic therapy may be warranted, if clinically indicated. Electronically Signed   By: Agustin Cree M.D.   On: 04/04/2023 15:25   DG Chest Portable 1 View  Result Date: 04/04/2023 CLINICAL DATA:  Chest pain for 2 weeks EXAM: PORTABLE CHEST 1 VIEW COMPARISON:  05/16/2019 FINDINGS: No pneumothorax or effusion. Borderline cardiopericardial silhouette. Slight prominence of the central vasculature. Overlapping cardiac leads. No edema. Apical pleural thickening IMPRESSION: Borderline size heart.  Mild central vascular congestion. Electronically Signed   By: Karen Kays M.D.   On: 04/04/2023 13:01     Assessment and Plan:   Progressive Angina - Patient presents complaining of 2 weeks of chest pain on exertion. Chest pain always occurs on exertion and is improved with rest. Associated with shortness of breath. Has been increasing in  frequency over the past 2 weeks  - Currently  chest pain free in the ED - hsTn neg x2 - CTA chest noted coronary artery calcifications. She does have risk factors including history of tobacco use (quit smoking yesterday), HTN, HLD, family history of CAD  - Plan for [facility] tomorrow. Patient is in agreement  - Ordered Echocardiogram  - Start ASA 81 mg daily  - Start lipitor 40 mg daily -= reasonable given coronary calcification on CT as well as other CRF's. - Ordered lipid panel for AM  - PRN SL nitroglycerin for chest pain   I concur with the major concerns for the symptoms being consistent with progressive angina.  With Coronary calcification noted on CT scan and significant risk factors, I agree that the most prudent course of action would be to proceed with cardiac catheterization and avoid unnecessary noninvasive testing.  Will post cath tomorrow.  Informed Consent   Shared Decision Making/Informed Consent The risks [stroke (1 in 1000), death (1 in 1000), kidney failure [usually temporary] (1 in 500), bleeding (1 in 200), allergic reaction [possibly serious] (1 in 200)], benefits (diagnostic support and management of coronary artery disease) and alternatives of a cardiac catheterization were discussed in detail with Ms. Contorno and she is willing to proceed.      HTN  - Continue metoprolol succinate 25 mg daily   Anxiety/depression  - Continue home regiment   Tobacco use  - Has smoked 1 PPD for several years, has been trying to cut back  - Reportedly quit smoking yesterday  Smoking cessation instruction/counseling given:  counseled patient on the dangers of tobacco use, advised patient to stop smoking, and reviewed strategies to maximize success   Patient wishes to be full code   Risk Assessment/Risk Scores:    For questions or updates, please contact [facility] Please consult [email or web address] for contact info under    Signed, Jonita Albee, PA-C  04/04/2023 3:47 PM   ATTENDING ATTESTATION  I have seen,  examined and evaluated the patient this afternoon emergency room along with Robet Leu, PA.  After reviewing all the available data and chart, we discussed the patients laboratory, study & physical findings as well as symptoms in detail.  I agree with her findings, examination as well as impression recommendations as per our discussion.    Attending adjustments noted in italics.   Patient with multiple cardiac risk factors presenting with symptoms are very concerning for progressive angina.  Thankfully she is ruled out for MI, but I do feel it is reasonable to monitor overnight based on the progression of her symptoms and proceed with cardiac catheterization tomorrow.  Where she to have recurrence of chest pain or elevation of troponin and levels overnight, would initiate IV heparin.  For now we can hold off.  Will plan for cardiac catheterization tomorrow.  CMP performed send noted above.    Marykay Lex, MD, MS 04/04/2023 4:27 PM Bryan Lemma, M.D., M.S. Interventional Cardiologist  [facility]  Pager # [phone] Phone # [phone] [address] [place], [place] [address]

## 2023-04-04 NOTE — ED Triage Notes (Signed)
BIBM from doctor office d/t CP x2 weeks, worse with exertion. Denies cardiac hx. PCP noticed EKG changes. EMS gave ASA 324mg  PO. 20g to L hand.

## 2023-04-04 NOTE — ED Provider Notes (Signed)
   ED Course / MDM   Clinical Course as of 04/04/23 1857  Wed Apr 04, 2023  1247 D-Dimer, Quant(!): 2.67 Dimer elevated, ordered CT PE [HN]  1247 Troponin I (High Sensitivity): 7 neg [HN]  1248 Comprehensive metabolic panel(!) Very mildly elevated AST/ALT, otherwise unremarkable. No abd pain or RUQ TTP. No fever.  [HN]  1248 Preg, Serum: NEGATIVE [HN]  1530 CT Angio Chest PE W and/or Wo Contrast 1. No evidence of pulmonary embolism. 2. Superior segment left lower lobe nodule measures 8 x 7 mm. Non-contrast chest CT at 6-12 months is recommended. If the nodule is stable at time of repeat CT, then future CT at 18-24 months (from today's scan) is considered optional for low-risk patients, but is recommended for high-risk patients. This recommendation follows the consensus statement: Guidelines for Management of Incidental Pulmonary Nodules Detected on CT Images: From the Fleischner Society 2017; Radiology 2017; 284:228-243. 3. Mild diffuse bronchial wall thickening with scattered subsegmental mucous plugging in the right upper and bilateral lower lobes, which can be seen in the setting of bronchitis. 4. Subcarinal lymphadenopathy, likely reactive. 5. Cholelithiasis. 6. Aortic Atherosclerosis (ICD10-I70.0) and Emphysema (ICD10-J43.9). Coronary artery calcifications. Assessment for potential risk factor modification, dietary therapy or pharmacologic therapy may be warranted, if clinically indicated.   [HN]  1530 NO RUQ TTP to indicate cholecystitis or symptomatic cholelithiasis. No cough or fever to indicate viral bronchitis.  [HN]  1532 Heart score is 5. Will consult to cardiology. [HN]  1544 Received sign out from Dr. Jearld Fenton pending cardiology consult for chest pain. Workup reassuring including  [WS]  1856 Patient was admitted by cardiology. [WS]    Clinical Course User Index [HN] Loetta Rough, MD [WS] Lonell Grandchild, MD   Medical Decision Making Amount and/or  Complexity of Data Reviewed Labs: ordered. Decision-making details documented in ED Course. Radiology: ordered. Decision-making details documented in ED Course.  Risk Prescription drug management. Decision regarding hospitalization.          Lonell Grandchild, MD 04/04/23 [id number]

## 2023-04-04 NOTE — ED Provider Notes (Signed)
[place] EMERGENCY DEPARTMENT AT [facility] Provider Note   CSN: [id number] Arrival date & time: 04/04/23  1048     History  Chief Complaint  Patient presents with   Chest Pain    [name] is a 52 y.o. female with PMH as listed below who presents with CP 1 to 2 weeks of exertional chest pain.  It is located in her central chest and radiates outward down bilateral arms.  She has never had this happen before.  She has 3 out of 10 in pain at rest but with exertion it is 10 out of 10.  She denies fever/chills, cough, nausea vomiting but endorses mild shortness of breath and diaphoresis when the pain happens.  She denies abdominal pain, urinary symptoms.  She has no cardiac history that she is aware of.  No recent history of travel, hospitalizations, surgeries, immobilizations.  No prior history of DVT or PE.  She went to see her primary care physician who was concerned for possible EKG changes and sent her to the emergency department via EMS.  EMS gave 324 mg of aspirin..    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 Authorizing Provider  acetaminophen (TYLENOL) 500 MG tablet Take 1 tablet (500 mg total) by mouth in the morning, at noon, in the evening, and at bedtime. 07/09/22  Yes Derwood Kaplan, MD  ADVAIR [facility] 115-21 MCG/ACT inhaler Inhale 2 puffs into the lungs 2 (two) times daily. 04/03/22  Yes [provider]  albuterol (VENTOLIN HFA) 108 (90 Base) MCG/ACT inhaler Inhale 1-2 puffs into the lungs every 6 (six) hours as needed for wheezing or shortness of breath. 04/11/21  Yes Lamptey, Britta Mccreedy, MD   escitalopram (LEXAPRO) 10 MG tablet Take 10 mg by mouth daily. 03/28/23  Yes [provider]  fluticasone (FLONASE) 50 MCG/ACT nasal spray Place 1 spray into both nostrils daily. 10/24/22  Yes [provider]  gabapentin (NEURONTIN) 600 MG tablet Take 600 mg by mouth 3 (three) times daily. 04/03/22  Yes [provider]  lidocaine (LIDODERM) 5 % Place 1 patch onto the skin daily. Remove & Discard patch within 12 hours or as directed by MD 02/05/23  Yes Dyanne Iha, MD  lurasidone (LATUDA) 40 MG TABS tablet Take 40 mg by mouth every evening. 03/28/23  Yes [provider]  methocarbamol (ROBAXIN) 500 MG tablet Take 1 tablet (500 mg total) by mouth 2 (two) times daily. 02/05/23  Yes Dyanne Iha, MD  metoprolol succinate (TOPROL-XL) 25 MG 24 hr tablet Take 25 mg by mouth daily. 02/26/22  Yes [provider]  montelukast (SINGULAIR) 10 MG tablet Take 10 mg by mouth daily. 05/25/22  Yes [provider]  omeprazole (PRILOSEC) 20 MG capsule Take 20 mg by mouth daily. 02/07/22  Yes [provider]  ondansetron (ZOFRAN) 4 MG tablet Take 1 tablet (4 mg total) by mouth every 6 (six) hours as needed for nausea or vomiting. 05/05/22  Yes Unk Lightning, PA  OXcarbazepine (TRILEPTAL) 150 MG tablet Take 1 tablet (150 mg total) by mouth 2 (two) times daily. 08/07/21 04/04/23 Yes Pashayan, Mardelle Matte, MD  sertraline (ZOLOFT) 25 MG tablet Take 3 tablets (75 mg total) by mouth daily. 08/07/21 04/04/23 Yes Pashayan, Mardelle Matte, MD  traZODone (DESYREL) 100 MG tablet Take 100 mg by mouth at bedtime. 03/30/22  Yes [provider]  triamcinolone ointment (KENALOG) 0.1 % Apply topically 2 (two) times daily. 03/04/22  Yes [provider]  trospium (SANCTURA) 20 MG tablet TAKE 1 TABLET(20 MG) BY MOUTH TWICE DAILY 03/30/23  Yes Lorriane Shire, MD  Varenicline Tartrate, Starter, 0.5 MG X 11 & 1 MG X 42 TBPK Take by mouth. 06/14/22  Yes  [provider]  estradiol-levonorgestrel (CLIMARA PRO) 0.045-0.015 MG/DAY Place 1 patch onto the skin once a week. Patient not taking: Reported on 04/04/2023 12/27/22   Lorriane Shire, MD  nicotine (NICODERM CQ - DOSED IN MG/24 HOURS) 14 mg/24hr patch Place 1 patch (14 mg total) onto the skin daily as needed (nicotine use). Patient not taking: Reported on 12/21/2022 08/06/21   Princess Bruins, DO  clonazePAM (KLONOPIN) 0.5 MG tablet Take 0.5 mg by mouth 3 (three) times daily as needed for anxiety.  04/06/20  [provider]  pantoprazole (PROTONIX) 40 MG tablet Take 40 mg by mouth daily.  10/23/13 04/06/20  [provider]      Allergies    Benadryl [diphenhydramine], Penicillins, and Latex    Review of Systems   Review of Systems A 10 point review of systems was performed and is negative unless otherwise reported in HPI.  Physical Exam Updated Vital Signs BP (!) 131/97   Pulse 87   Temp 98 F (36.7 C) (Oral)   Resp 19   Ht 5\' 3"  (1.6 m)   Wt 87.1 kg   SpO2 99%   BMI 34.01 kg/m  Physical Exam General: Normal appearing female, lying in bed.  HEENT: PERRLA, Sclera anicteric, MMM, trachea midline.  Cardiology: RRR, no murmurs/rubs/gallops. BL radial and DP pulses equal bilaterally.  Resp: Normal respiratory rate and effort. CTAB, no wheezes, rhonchi, crackles.  Abd: Soft, non-tender, non-distended. No rebound tenderness or guarding.  GU: Deferred. MSK: No peripheral edema or signs of trauma. Extremities without deformity or TTP. No cyanosis or clubbing. Skin: warm, dry. No rashes or lesions. Back: No CVA tenderness Neuro: A&Ox4, CNs II-XII grossly intact. MAEs. Sensation grossly intact.  Psych: Normal mood and affect.   ED Results / Procedures / Treatments   Labs (all labs ordered are listed, but only abnormal results are displayed) Labs Reviewed  CBC - Abnormal; Notable for the following components:      Result Value   RDW 16.1 (*)    All other  components within normal limits  COMPREHENSIVE METABOLIC PANEL - Abnormal; Notable for the following components:   Glucose, Bld 104 (*)    AST 46 (*)    ALT 46 (*)    Total Bilirubin <0.1 (*)    All other components within normal limits  D-DIMER, QUANTITATIVE - Abnormal; Notable for the following components:   D-Dimer, Quant 2.67 (*)    All other components within normal limits  HCG, SERUM, QUALITATIVE  PROTIME-INR  RAPID URINE DRUG SCREEN, HOSP PERFORMED  TROPONIN I (HIGH SENSITIVITY)  TROPONIN I (HIGH SENSITIVITY)    EKG EKG Interpretation Date/Time:  Wednesday April 04 2023 14:22:23 EDT Ventricular Rate:  84 PR Interval:  155 QRS Duration:  85 QT Interval:  403  QTC Calculation: 477 R Axis:   55  Text Interpretation: Sinus rhythm Probable left atrial enlargement Anterior infarct, old Confirmed by Vivi Barrack [phone]) on 04/04/2023 3:31:57 PM  Radiology CT Angio Chest PE W and/or Wo Contrast  Result Date: 04/04/2023 CLINICAL DATA:  Two-week history of chest pain, worse with exertion. EXAM: CT ANGIOGRAPHY CHEST WITH CONTRAST TECHNIQUE: Multidetector CT imaging of the chest was performed using the standard protocol during bolus administration of intravenous contrast. Multiplanar CT image reconstructions and MIPs were obtained to evaluate the vascular anatomy. RADIATION DOSE REDUCTION: This exam was performed according to the departmental dose-optimization program which includes automated exposure control, adjustment of the mA and/or kV according to patient size and/or use of iterative reconstruction technique. CONTRAST:  75mL OMNIPAQUE IOHEXOL 350 MG/ML SOLN COMPARISON:  Same day chest radiograph FINDINGS: Cardiovascular: The study is high quality for the evaluation of pulmonary embolism. There are no filling defects in the central, lobar, segmental or subsegmental pulmonary artery branches to suggest acute pulmonary embolism. Anatomic variant common origin of the brachiocephalic and  common carotid arteries. normal heart size. No significant pericardial fluid/thickening. Coronary artery calcifications and aortic atherosclerosis. Mediastinum/Nodes: Imaged thyroid gland without nodules meeting criteria for imaging follow-up by size. Normal esophagus. 1.1 cm subcarinal lymph node (5:56). Lungs/Pleura: The central airways are patent. Apical predominant paraseptal emphysema. Mild diffuse bronchial wall thickening. Scattered subsegmental mucous plugging in the right upper and bilateral lower lobes. Superior segment left lower lobe nodule measures 8 x 7 mm (6:41). No pneumothorax. No pleural effusion. Upper abdomen: Cholelithiasis. Musculoskeletal: No acute or abnormal lytic or blastic osseous lesions. Multilevel degenerative changes of the thoracic spine. Review of the MIP images confirms the above findings. IMPRESSION: 1. No evidence of pulmonary embolism. 2. Superior segment left lower lobe nodule measures 8 x 7 mm. Non-contrast chest CT at 6-12 months is recommended. If the nodule is stable at time of repeat CT, then future CT at 18-24 months (from today's scan) is considered optional for low-risk patients, but is recommended for high-risk patients. This recommendation follows the consensus statement: Guidelines for Management of Incidental Pulmonary Nodules Detected on CT Images: From the Fleischner Society 2017; Radiology 2017; 284:228-243. 3. Mild diffuse bronchial wall thickening with scattered subsegmental mucous plugging in the right upper and bilateral lower lobes, which can be seen in the setting of bronchitis. 4. Subcarinal lymphadenopathy, likely reactive. 5. Cholelithiasis. 6. Aortic Atherosclerosis (ICD10-I70.0) and Emphysema (ICD10-J43.9). Coronary artery calcifications. Assessment for potential risk factor modification, dietary therapy or pharmacologic therapy may be warranted, if clinically indicated. Electronically Signed   By: Agustin Cree M.D.   On: 04/04/2023 15:25   DG Chest  Portable 1 View  Result Date: 04/04/2023 CLINICAL DATA:  Chest pain for 2 weeks EXAM: PORTABLE CHEST 1 VIEW COMPARISON:  05/16/2019 FINDINGS: No pneumothorax or effusion. Borderline cardiopericardial silhouette. Slight prominence of the central vasculature. Overlapping cardiac leads. No edema. Apical pleural thickening IMPRESSION: Borderline size heart.  Mild central vascular congestion. Electronically Signed   By: Karen Kays M.D.   On: 04/04/2023 13:01    Procedures Procedures    Medications Ordered in ED Medications  iohexol (OMNIPAQUE) 350 MG/ML injection 75 mL (75 mLs Intravenous Contrast Given 04/04/23 1325)    ED Course/ Medical Decision Making/ A&P                          Medical Decision Making Amount and/or Complexity of Data Reviewed Labs: ordered. Decision-making details documented  in ED Course. Radiology: ordered. Decision-making details documented in ED Course.  Risk Prescription drug management.    This patient presents to the ED for concern of chest pain worse w/ exertion, this involves an extensive number of treatment options, and is a complaint that carries with it a high risk of complications and morbidity.  I considered the following differential and admission for this acute, potentially life threatening condition.   MDM:    DDX for chest pain includes but is not limited to: Greatest concern for ACS or angina in this patient with exertional chest pain. Will obtain troponins and ultimately likely discussed with cardiology.  PCP was concern for EKG changes but I do not notice any significant EKG changes from her prior. Very low suspicion for aortic dissection given presenting sx. Patient cannot PERC out based on age, but will obtain D dimer and reassess, minimal risk factors for PE. No abdominal pain and no c/f biliary disease.   Clinical Course as of 04/04/23 1556  Wed Apr 04, 2023  1247 D-Dimer, Quant(!): 2.67 Dimer elevated, ordered CT PE [HN]  1247  Troponin I (High Sensitivity): 7 neg [HN]  1248 Comprehensive metabolic panel(!) Very mildly elevated AST/ALT, otherwise unremarkable. No abd pain or RUQ TTP. No fever.  [HN]  1248 Preg, Serum: NEGATIVE [HN]  1530 CT Angio Chest PE W and/or Wo Contrast 1. No evidence of pulmonary embolism. 2. Superior segment left lower lobe nodule measures 8 x 7 mm. Non-contrast chest CT at 6-12 months is recommended. If the nodule is stable at time of repeat CT, then future CT at 18-24 months (from today's scan) is considered optional for low-risk patients, but is recommended for high-risk patients. This recommendation follows the consensus statement: Guidelines for Management of Incidental Pulmonary Nodules Detected on CT Images: From the Fleischner Society 2017; Radiology 2017; 284:228-243. 3. Mild diffuse bronchial wall thickening with scattered subsegmental mucous plugging in the right upper and bilateral lower lobes, which can be seen in the setting of bronchitis. 4. Subcarinal lymphadenopathy, likely reactive. 5. Cholelithiasis. 6. Aortic Atherosclerosis (ICD10-I70.0) and Emphysema (ICD10-J43.9). Coronary artery calcifications. Assessment for potential risk factor modification, dietary therapy or pharmacologic therapy may be warranted, if clinically indicated.   [HN]  1530 NO RUQ TTP to indicate cholecystitis or symptomatic cholelithiasis. No cough or fever to indicate viral bronchitis.  [HN]  1532 Heart score is 5. Will consult to cardiology. [HN]  1544 Received sign out from Dr. Jearld Fenton pending cardiology consult for chest pain. Workup reassuring including  [WS]    Clinical Course User Index [HN] Loetta Rough, MD [WS] Lonell Grandchild, MD    Labs: I Ordered, and personally interpreted labs.  The pertinent results include: Those listed above  Imaging Studies ordered: I ordered imaging studies including chest x-ray, CT PE I independently visualized and interpreted imaging. I agree  with the radiologist interpretation  Additional history obtained from chart review.    Cardiac Monitoring: The patient was maintained on a cardiac monitor.  I personally viewed and interpreted the cardiac monitored which showed an underlying rhythm of: Normal sinus rhythm  Reevaluation: After the interventions noted above, I reevaluated the patient and found that they have :improved  Social Determinants of Health: Lives independently  Disposition:  Patient is signed out to the oncoming ED physician Dr. Suezanne Jacquet who is made aware of her history, presentation, exam, workup, and plan. Plan is to discuss with cardiology.    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    Loetta Rough, MD 04/04/23 [phone]

## 2023-04-05 ENCOUNTER — Encounter (HOSPITAL_COMMUNITY)
Admission: EM | Disposition: A | Discharge status: Home / Self Care | Payer: Self-pay | Source: Home / Self Care | Attending: Emergency Medicine

## 2023-04-05 ENCOUNTER — Other Ambulatory Visit (HOSPITAL_COMMUNITY): Payer: Self-pay

## 2023-04-05 ENCOUNTER — Telehealth: Payer: Self-pay | Admitting: Cardiology

## 2023-04-05 ENCOUNTER — Other Ambulatory Visit (HOSPITAL_COMMUNITY): Payer: 59

## 2023-04-05 DIAGNOSIS — J45909 Unspecified asthma, uncomplicated: Secondary | ICD-10-CM | POA: Diagnosis not present

## 2023-04-05 DIAGNOSIS — F17200 Nicotine dependence, unspecified, uncomplicated: Secondary | ICD-10-CM | POA: Diagnosis not present

## 2023-04-05 DIAGNOSIS — I10 Essential (primary) hypertension: Secondary | ICD-10-CM | POA: Diagnosis not present

## 2023-04-05 DIAGNOSIS — I25110 Atherosclerotic heart disease of native coronary artery with unstable angina pectoris: Secondary | ICD-10-CM | POA: Diagnosis not present

## 2023-04-05 DIAGNOSIS — I25750 Atherosclerosis of native coronary artery of transplanted heart with unstable angina: Secondary | ICD-10-CM

## 2023-04-05 DIAGNOSIS — Z955 Presence of coronary angioplasty implant and graft: Secondary | ICD-10-CM

## 2023-04-05 DIAGNOSIS — E119 Type 2 diabetes mellitus without complications: Secondary | ICD-10-CM | POA: Diagnosis not present

## 2023-04-05 DIAGNOSIS — E785 Hyperlipidemia, unspecified: Secondary | ICD-10-CM | POA: Diagnosis not present

## 2023-04-05 DIAGNOSIS — I2089 Other forms of angina pectoris: Secondary | ICD-10-CM | POA: Diagnosis not present

## 2023-04-05 HISTORY — PX: CORONARY STENT INTERVENTION: CATH118234

## 2023-04-05 HISTORY — PX: LEFT HEART CATH AND CORONARY ANGIOGRAPHY: CATH118249

## 2023-04-05 HISTORY — PX: CORONARY PRESSURE/FFR STUDY: CATH118243

## 2023-04-05 HISTORY — PX: CORONARY IMAGING/OCT: CATH118326

## 2023-04-05 LAB — LIPID PANEL
Cholesterol: 108 mg/dL (ref 0–200)
HDL: 55 mg/dL (ref >40)
LDL Cholesterol: 30 mg/dL (ref 0–99)
Total CHOL/HDL Ratio: 2 {ratio}
Triglycerides: 115 mg/dL (ref <150)
VLDL: 23 mg/dL (ref 0–40)

## 2023-04-05 LAB — BASIC METABOLIC PANEL
Anion gap: 10 (ref 5–15)
BUN: 12 mg/dL (ref 6–20)
CO2: 26 mmol/L (ref 22–32)
Calcium: 9.3 mg/dL (ref 8.9–10.3)
Chloride: 100 mmol/L (ref 98–111)
Creatinine, Ser: 0.86 mg/dL (ref 0.44–1.00)
GFR, Estimated: >60 mL/min (ref >60)
Glucose, Bld: 105 mg/dL — ABNORMAL HIGH (ref 70–99)
Potassium: 4.9 mmol/L (ref 3.5–5.1)
Sodium: 136 mmol/L (ref 135–145)

## 2023-04-05 LAB — POCT ACTIVATED CLOTTING TIME
Activated Clotting Time: 403 s
Activated Clotting Time: 415 s
Activated Clotting Time: 311 s

## 2023-04-05 LAB — CBC
HCT: 34.4 % — ABNORMAL LOW (ref 36.0–46.0)
Hemoglobin: 11.3 g/dL — ABNORMAL LOW (ref 12.0–15.0)
MCH: 29.1 pg (ref 26.0–34.0)
MCHC: 32.8 g/dL (ref 30.0–36.0)
MCV: 88.7 fL (ref 80.0–100.0)
Platelets: 235 10*3/uL (ref 150–400)
RBC: 3.88 MIL/uL (ref 3.87–5.11)
RDW: 16.6 % — ABNORMAL HIGH (ref 11.5–15.5)
WBC: 8.5 10*3/uL (ref 4.0–10.5)
nRBC: 0 % (ref 0.0–0.2)

## 2023-04-05 LAB — LIPOPROTEIN A (LPA): Lipoprotein (a): 224.3 nmol/L — ABNORMAL HIGH (ref <75.0)

## 2023-04-05 SURGERY — LEFT HEART CATH AND CORONARY ANGIOGRAPHY
Anesthesia: LOCAL

## 2023-04-05 MED ORDER — FENTANYL CITRATE (PF) 100 MCG/2ML IJ SOLN
INTRAMUSCULAR | Status: DC | PRN
  Administered 2023-04-05 (×3): 25 ug via INTRAVENOUS

## 2023-04-05 MED ORDER — NITROGLYCERIN 0.4 MG SL SUBL
0.4000 mg | SUBLINGUAL_TABLET | SUBLINGUAL | 2 refills | Status: DC | PRN
  Filled 2023-04-05: qty 25, 8d supply, fill #0

## 2023-04-05 MED ORDER — SODIUM CHLORIDE 0.9% FLUSH: 3.0000 mL | Freq: Two times a day (BID) | INTRAVENOUS | Status: DC

## 2023-04-05 MED ORDER — LIDOCAINE HCL (PF) 1 % IJ SOLN
INTRAMUSCULAR | Status: AC
  Filled 2023-04-05: qty 30

## 2023-04-05 MED ORDER — VERAPAMIL HCL 2.5 MG/ML IV SOLN
INTRAVENOUS | Status: AC
  Filled 2023-04-05: qty 2

## 2023-04-05 MED ORDER — HYDRALAZINE HCL 20 MG/ML IJ SOLN: 10.0000 mg | INTRAMUSCULAR | Status: DC | PRN

## 2023-04-05 MED ORDER — SODIUM CHLORIDE 0.9 % IV SOLN: INTRAVENOUS | Status: AC

## 2023-04-05 MED ORDER — METOPROLOL SUCCINATE ER 25 MG PO TB24
25.0000 mg | ORAL_TABLET | Freq: Every day | ORAL | 1 refills | Status: DC
Start: 2023-04-05 — End: 2023-06-13
  Filled 2023-04-05: qty 30, 30d supply, fill #0
  Filled 2023-04-05: qty 90, 90d supply, fill #0

## 2023-04-05 MED ORDER — ASPIRIN 81 MG PO CHEW
81.0000 mg | CHEWABLE_TABLET | Freq: Every day | ORAL | 2 refills | Status: DC
  Filled 2023-04-05: qty 90, 90d supply, fill #0

## 2023-04-05 MED ORDER — MIDAZOLAM HCL 2 MG/2ML IJ SOLN
INTRAMUSCULAR | Status: AC
  Filled 2023-04-05: qty 2

## 2023-04-05 MED ORDER — VERAPAMIL HCL 2.5 MG/ML IV SOLN
INTRAVENOUS | Status: DC | PRN
  Administered 2023-04-05: 10 mL via INTRA_ARTERIAL

## 2023-04-05 MED ORDER — LABETALOL HCL 5 MG/ML IV SOLN: 10.0000 mg | INTRAVENOUS | Status: DC | PRN

## 2023-04-05 MED ORDER — SODIUM CHLORIDE 0.9 % IV SOLN: 250.0000 mL | INTRAVENOUS | Status: DC | PRN

## 2023-04-05 MED ORDER — TICAGRELOR 90 MG PO TABS
ORAL_TABLET | ORAL | Status: AC
  Filled 2023-04-05: qty 2

## 2023-04-05 MED ORDER — TICAGRELOR 90 MG PO TABS
90.0000 mg | ORAL_TABLET | Freq: Two times a day (BID) | ORAL | 2 refills | Status: DC
  Filled 2023-04-05: qty 180, 90d supply, fill #0

## 2023-04-05 MED ORDER — SODIUM CHLORIDE 0.9% FLUSH: 3.0000 mL | INTRAVENOUS | Status: DC | PRN

## 2023-04-05 MED ORDER — MIDAZOLAM HCL 2 MG/2ML IJ SOLN
INTRAMUSCULAR | Status: DC | PRN
  Administered 2023-04-05 (×3): 1 mg via INTRAVENOUS

## 2023-04-05 MED ORDER — ASPIRIN 81 MG PO CHEW: 81.0000 mg | CHEWABLE_TABLET | Freq: Every day | ORAL | Status: DC

## 2023-04-05 MED ORDER — ACETAMINOPHEN 325 MG PO TABS
650.0000 mg | ORAL_TABLET | ORAL | Status: DC | PRN
  Administered 2023-04-05: 650 mg via ORAL
  Filled 2023-04-05: qty 2

## 2023-04-05 MED ORDER — TICAGRELOR 90 MG PO TABS: 90.0000 mg | ORAL_TABLET | Freq: Two times a day (BID) | ORAL | Status: DC

## 2023-04-05 MED ORDER — IOHEXOL 350 MG/ML SOLN
INTRAVENOUS | Status: DC | PRN
  Administered 2023-04-05: 180 mL

## 2023-04-05 MED ORDER — HEPARIN SODIUM (PORCINE) 1000 UNIT/ML IJ SOLN
INTRAMUSCULAR | Status: DC | PRN
  Administered 2023-04-05: 5000 [IU] via INTRAVENOUS
  Administered 2023-04-05: 4000 [IU] via INTRAVENOUS

## 2023-04-05 MED ORDER — LIDOCAINE HCL (PF) 1 % IJ SOLN
INTRAMUSCULAR | Status: DC | PRN
  Administered 2023-04-05: 5 mL

## 2023-04-05 MED ORDER — FENTANYL CITRATE (PF) 100 MCG/2ML IJ SOLN
INTRAMUSCULAR | Status: AC
  Filled 2023-04-05: qty 2

## 2023-04-05 MED ORDER — ONDANSETRON HCL 4 MG/2ML IJ SOLN: 4.0000 mg | Freq: Four times a day (QID) | INTRAMUSCULAR | Status: DC | PRN

## 2023-04-05 MED ORDER — TICAGRELOR 90 MG PO TABS
ORAL_TABLET | ORAL | Status: DC | PRN
  Administered 2023-04-05: 180 mg via ORAL

## 2023-04-05 MED ORDER — HEPARIN (PORCINE) IN NACL 1000-0.9 UT/500ML-% IV SOLN
INTRAVENOUS | Status: DC | PRN
  Administered 2023-04-05 (×2): 500 mL

## 2023-04-05 MED ORDER — HEPARIN SODIUM (PORCINE) 1000 UNIT/ML IJ SOLN
INTRAMUSCULAR | Status: AC
  Filled 2023-04-05: qty 10

## 2023-04-05 SURGICAL SUPPLY — 22 items
BALLN EMERGE MR 2.0X12 (BALLOONS) ×1
BALLN ~~LOC~~ EMERGE MR 2.5X12 (BALLOONS) ×1
BALLOON EMERGE MR 2.0X12 (BALLOONS) IMPLANT
BALLOON ~~LOC~~ EMERGE MR 2.5X12 (BALLOONS) IMPLANT
CATH DRAGONFLY OPSTAR (CATHETERS) IMPLANT
CATH INFINITI 5FR ANG PIGTAIL (CATHETERS) IMPLANT
CATH INFINITI AMBI 6FR TG (CATHETERS) IMPLANT
CATH LAUNCHER 6FR EBU3.5 (CATHETERS) IMPLANT
CATH LAUNCHER 6FR JR4 (CATHETERS) IMPLANT
DEVICE RAD COMP TR BAND LRG (VASCULAR PRODUCTS) IMPLANT
GLIDESHEATH SLEND SS 6F .021 (SHEATH) IMPLANT
GUIDEWIRE PRESSURE X 175 (WIRE) IMPLANT
KIT ENCORE 26 ADVANTAGE (KITS) IMPLANT
KIT HEMO VALVE WATCHDOG (MISCELLANEOUS) IMPLANT
PACK CARDIAC CATHETERIZATION (CUSTOM PROCEDURE TRAY) ×2 IMPLANT
SET ATX-X65L (MISCELLANEOUS) IMPLANT
STENT ONYX FRONTIER 2.5X18 (Permanent Stent) IMPLANT
STENT ONYX FRONTIER 2.5X26 (Permanent Stent) IMPLANT
TUBING CIL FLEX 10 FLL-RA (TUBING) IMPLANT
WIRE ASAHI PROWATER 180CM (WIRE) IMPLANT
WIRE EMERALD 3MM-J .035X260CM (WIRE) IMPLANT
WIRE RUNTHROUGH IZANAI 014 180 (WIRE) IMPLANT

## 2023-04-05 NOTE — Telephone Encounter (Signed)
Currently admitted 04/05/23

## 2023-04-05 NOTE — Interval H&P Note (Signed)
History and Physical Interval Note:  04/05/2023 7:06 AM  [name]  has presented today for surgery, with the diagnosis of progressive angina.  The various methods of treatment have been discussed with the patient and family. After consideration of risks, benefits and other options for treatment, the patient has consented to  Procedure(s): LEFT HEART CATH AND CORONARY ANGIOGRAPHY (N/A) as a surgical intervention.  The patient's history has been reviewed, patient examined, no change in status, stable for surgery.  I have reviewed the patient's chart and labs.  Questions were answered to the patient's satisfaction.     Orbie Pyo

## 2023-04-05 NOTE — Progress Notes (Signed)
Patients right radial TR band removed at 1454. Gauze dressing applied, Right radial clean, dry, and intact. Patient walked to the bathroom without difficulties.

## 2023-04-05 NOTE — Discharge Summary (Addendum)
Discharge Summary    Patient ID: [name] MRN: [id number]; DOB: 1971/01/03  Admit date: 04/04/2023 Discharge date: 04/05/2023  PCP:  Estevan Oaks, NP   [facility] Providers Cardiologist:  Bryan Lemma, MD     Discharge Diagnoses    Principal Problem:   Progressive angina [facility]) Active Problems:   MDD (major depressive disorder), recurrent episode, severe (HCC)   Benign essential HTN   Tobacco use disorder   Depression, unspecified  Diagnostic Studies/Procedures    Cath: 04/05/2023    Mid LAD lesion is 99% stenosed.   Prox RCA lesion is 65% stenosed.   1st Diag lesion is 99% stenosed.   A stent was successfully placed.   Post intervention, there is a 0% residual stenosis.   1.  Subtotally occluded mid LAD treated with 2 overlapping drug-eluting stents with OCT optimization. 2.  RFR negative proximal right coronary artery disease. 3.  LVEDP of 6 mmHg.   Recommendation: The results were reviewed with Dr. Herbie Baltimore as well as the patient's mother and daughter over the phone.  Dual antiplatelet therapy for at least 6 months and preferably 1 year.  Same-day discharge with close hospital follow-up.  Diagnostic Dominance: Right  Intervention   _____________   History of Present Illness     [name] is a 52 y.o. female with past medical history of major depressive disorder, anxiety, polysubstance abuse, hypertension, hyperlipidemia who was seen for the evaluation of chest pain. Patient does not have any past cardiac history and does not follow with a cardiologist.    Patient presented to the ED on 10/9 complaining of chest pain that had been going on for 2 weeks. Reportedly, pain was worse with exertion. Vital signs in the ED showed BP 130/77, HR 89 BPM, oxygen 100% on room air. EKG showed sinus rhythm, HR 74 BPM, nonspecific T wave abnormalities. Labs showed hsTn 7, 7. K 4.1, creatinine 0.98, WBC 7.9, hemoglobin 12.3, platelets 280. CTA chest  showed no evidence of PE, left lower lobe nodule measuring 8x7 mm. Did note coronary artery calcifications.    On interview, patient reported that for the past 3 weeks, she had been having episodes of chest pain. Pain initially feels sharp, then feels tight/constricting. Often made her short of breath. Radiates down her arms. Pain always occurs when she was exerting herself and has always been relieved by rest. She has noticed that symptoms have been happening more frequently. Has a mild cough. Denied significant nausea. No vomiting, fever, chills, ankle edema.     Hospital Course     Progressive Angina -- Patient presented complaining of 2 weeks of chest pain on exertion. hsTn negative x2. Noted to have coronary calcification on CT scan -- underwent cardiac cath noted above with 99% mid LAD treated with PCI/DES x 2 with overlapping stents and OCT optimization.  Recommendations for DAPT with aspirin/Brilinta for at least 1 year. Will plan for outpatient echocardiogram -- Seen by cardiac rehab -- Continue aspirin, Brilinta, atorvastatin 80 mg daily   HTN  -- Continue metoprolol succinate 25 mg daily   HLD -- LDL 30, HDL 55 -- Continue atorvastatin 80 mg daily   Anxiety/depression  -- Continue home regiment    Tobacco use  -- Has smoked 1 PPD for several years, has been trying to cut back  -- cessation advised   General: Well developed, well nourished, female appearing in no acute distress. Head: Normocephalic, atraumatic.  Neck: Supple without bruits, JVD. Lungs:  Resp regular and unlabored, CTA. Heart: RRR, S1, S2, no S3, S4, or murmur; no rub. Abdomen: Soft, non-tender, non-distended with normoactive bowel sounds. No hepatomegaly. No rebound/guarding. No obvious abdominal masses. Extremities: No clubbing, cyanosis, edema. Distal pedal pulses are 2+ bilaterally. Right cath site stable without bruising or hematoma Neuro: Alert and oriented X 3. Moves all extremities  spontaneously. Psych: Normal affect.  Patient was seen by Dr. Herbie Baltimore and deemed stable for discharge home.  Follow-up arranged in the office.  Medication sent to [facility] pharmacy.  Educated by [employer].D. prior to discharge.  Did the patient have an acute coronary syndrome (MI, NSTEMI, STEMI, etc) this admission?:  No                               Did the patient have a percutaneous coronary intervention (stent / angioplasty)?:  Yes.     Cath/PCI Registry Performance & Quality Measures: Aspirin prescribed? - Yes ADP Receptor Inhibitor (Plavix/Clopidogrel, Brilinta/Ticagrelor or Effient/Prasugrel) prescribed (includes medically managed patients)? - Yes High Intensity Statin (Lipitor 40-80mg  or Crestor 20-40mg ) prescribed? - Yes For EF <40%, was ACEI/ARB prescribed? - No - Outpatient Echocardiogram to assess EF will be scheduled. For EF <40%, Aldosterone Antagonist (Spironolactone or Eplerenone) prescribed? - No - Outpatient Echocardiogram to assess EF will be scheduled. Cardiac Rehab Phase II ordered? - Yes     The patient will be scheduled for a TOC follow up appointment in 10-14 days.  A message has been sent to the [facility] and Scheduling Pool at the office where the patient should be seen for follow up.  _____________  Discharge Vitals Blood pressure (!) 130/94, pulse 95, temperature (!) 97.5 F (36.4 C), temperature source Oral, resp. rate 18, height 5\' 3"  (1.6 m), weight 87.1 kg, SpO2 96%.  Filed Weights   04/04/23 1054  Weight: 87.1 kg    Labs & Radiologic Studies    CBC Recent Labs    04/04/23 1718 04/05/23 0341  WBC 7.9 8.5  HGB 12.2 11.3*  HCT 38.2 34.4*  MCV 88.4 88.7  PLT 256 235   Basic Metabolic Panel Recent Labs    40/98/11 1104 04/04/23 1718 04/05/23 0341  NA 138  --  136  K 4.1  --  4.9  CL 103  --  100  CO2 26  --  26  GLUCOSE 104*  --  105*  BUN 12  --  12  CREATININE 0.98 0.82 0.86  CALCIUM 9.8  --  9.3   Liver Function Tests Recent Labs     04/04/23 1104  AST 46*  ALT 46*  ALKPHOS 94  BILITOT <0.1*  PROT 7.7  ALBUMIN 3.6   No results for input(s): "LIPASE", "AMYLASE" in the last 72 hours. High Sensitivity Troponin:   Recent Labs  Lab 04/04/23 1104 04/04/23 1258  TROPONINIHS 7 7    BNP Invalid input(s): "POCBNP" D-Dimer Recent Labs    04/04/23 1104  DDIMER 2.67*   Hemoglobin A1C No results for input(s): "HGBA1C" in the last 72 hours. Fasting Lipid Panel Recent Labs    04/05/23 0341  CHOL 108  HDL 55  LDLCALC 30  TRIG 115  CHOLHDL 2.0   Thyroid Function Tests No results for input(s): "TSH", "T4TOTAL", "T3FREE", "THYROIDAB" in the last 72 hours.  Invalid input(s): "FREET3" _____________  CARDIAC CATHETERIZATION  Result Date: 04/05/2023   Mid LAD lesion is 99% stenosed.   Prox RCA lesion is 65%  stenosed.   1st Diag lesion is 99% stenosed.   A stent was successfully placed.   Post intervention, there is a 0% residual stenosis. 1.  Subtotally occluded mid LAD treated with 2 overlapping drug-eluting stents with OCT optimization. 2.  RFR negative proximal right coronary artery disease. 3.  LVEDP of 6 mmHg. Recommendation: The results were reviewed with Dr. Herbie Baltimore as well as the patient's mother and daughter over the phone.  Dual antiplatelet therapy for at least 6 months and preferably 1 year.  Same-day discharge with close hospital follow-up.   CT Angio Chest PE W and/or Wo Contrast  Result Date: 04/04/2023 CLINICAL DATA:  Two-week history of chest pain, worse with exertion. EXAM: CT ANGIOGRAPHY CHEST WITH CONTRAST TECHNIQUE: Multidetector CT imaging of the chest was performed using the standard protocol during bolus administration of intravenous contrast. Multiplanar CT image reconstructions and MIPs were obtained to evaluate the vascular anatomy. RADIATION DOSE REDUCTION: This exam was performed according to the departmental dose-optimization program which includes automated exposure control, adjustment of  the mA and/or kV according to patient size and/or use of iterative reconstruction technique. CONTRAST:  75mL OMNIPAQUE IOHEXOL 350 MG/ML SOLN COMPARISON:  Same day chest radiograph FINDINGS: Cardiovascular: The study is high quality for the evaluation of pulmonary embolism. There are no filling defects in the central, lobar, segmental or subsegmental pulmonary artery branches to suggest acute pulmonary embolism. Anatomic variant common origin of the brachiocephalic and common carotid arteries. normal heart size. No significant pericardial fluid/thickening. Coronary artery calcifications and aortic atherosclerosis. Mediastinum/Nodes: Imaged thyroid gland without nodules meeting criteria for imaging follow-up by size. Normal esophagus. 1.1 cm subcarinal lymph node (5:56). Lungs/Pleura: The central airways are patent. Apical predominant paraseptal emphysema. Mild diffuse bronchial wall thickening. Scattered subsegmental mucous plugging in the right upper and bilateral lower lobes. Superior segment left lower lobe nodule measures 8 x 7 mm (6:41). No pneumothorax. No pleural effusion. Upper abdomen: Cholelithiasis. Musculoskeletal: No acute or abnormal lytic or blastic osseous lesions. Multilevel degenerative changes of the thoracic spine. Review of the MIP images confirms the above findings. IMPRESSION: 1. No evidence of pulmonary embolism. 2. Superior segment left lower lobe nodule measures 8 x 7 mm. Non-contrast chest CT at 6-12 months is recommended. If the nodule is stable at time of repeat CT, then future CT at 18-24 months (from today's scan) is considered optional for low-risk patients, but is recommended for high-risk patients. This recommendation follows the consensus statement: Guidelines for Management of Incidental Pulmonary Nodules Detected on CT Images: From the Fleischner Society 2017; Radiology 2017; 284:228-243. 3. Mild diffuse bronchial wall thickening with scattered subsegmental mucous plugging in the  right upper and bilateral lower lobes, which can be seen in the setting of bronchitis. 4. Subcarinal lymphadenopathy, likely reactive. 5. Cholelithiasis. 6. Aortic Atherosclerosis (ICD10-I70.0) and Emphysema (ICD10-J43.9). Coronary artery calcifications. Assessment for potential risk factor modification, dietary therapy or pharmacologic therapy may be warranted, if clinically indicated. Electronically Signed   By: Agustin Cree M.D.   On: 04/04/2023 15:25   DG Chest Portable 1 View  Result Date: 04/04/2023 CLINICAL DATA:  Chest pain for 2 weeks EXAM: PORTABLE CHEST 1 VIEW COMPARISON:  05/16/2019 FINDINGS: No pneumothorax or effusion. Borderline cardiopericardial silhouette. Slight prominence of the central vasculature. Overlapping cardiac leads. No edema. Apical pleural thickening IMPRESSION: Borderline size heart.  Mild central vascular congestion. Electronically Signed   By: Karen Kays M.D.   On: 04/04/2023 13:01   Disposition   Pt is being discharged home  today in good condition.  Follow-up Plans & Appointments     Follow-up Information     Jonita Albee, PA-C Follow up on 04/13/2023.   Specialty: Cardiology Why: at 8:50am for your follow up appt with cardiology Contact information: [address] [place] [place] [address] [phone]                Discharge Instructions     AMB Referral to Cardiac Rehabilitation - Phase II   Complete by: As directed    Diagnosis: Coronary Stents   After initial evaluation and assessments completed: Virtual Based Care may be provided alone or in conjunction with Phase 2 Cardiac Rehab based on patient barriers.: Yes   Intensive Cardiac Rehabilitation (ICR) [facility] location only OR Traditional Cardiac Rehabilitation (TCR) *If criteria for ICR are not met will enroll in TCR [facility] only): Yes        Discharge Medications   Allergies as of 04/05/2023       Reactions   Benadryl [diphenhydramine]    "heart races"   Penicillins  Nausea And Vomiting   Latex Rash        Medication List     STOP taking these medications    Climara Pro 0.045-0.015 MG/DAY Generic drug: estradiol-levonorgestrel       TAKE these medications    acetaminophen 500 MG tablet Commonly known as: TYLENOL Take 1 tablet (500 mg total) by mouth in the morning, at noon, in the evening, and at bedtime.   Advair HFA 115-21 MCG/ACT inhaler Generic drug: fluticasone-salmeterol Inhale 2 puffs into the lungs 2 (two) times daily.   albuterol 108 (90 Base) MCG/ACT inhaler Commonly known as: VENTOLIN HFA Inhale 1-2 puffs into the lungs every 6 (six) hours as needed for wheezing or shortness of breath.   Aspirin Low Dose 81 MG chewable tablet Generic drug: aspirin Chew 1 tablet (81 mg total) by mouth daily. Start taking on: April 06, 2023   Brilinta 90 MG Tabs tablet Generic drug: ticagrelor Take 1 tablet (90 mg total) by mouth 2 (two) times daily.   escitalopram 10 MG tablet Commonly known as: LEXAPRO Take 10 mg by mouth daily.   fluticasone 50 MCG/ACT nasal spray Commonly known as: FLONASE Place 1 spray into both nostrils daily.   gabapentin 600 MG tablet Commonly known as: NEURONTIN Take 600 mg by mouth 3 (three) times daily.   lidocaine 5 % Commonly known as: Lidoderm Place 1 patch onto the skin daily. Remove & Discard patch within 12 hours or as directed by MD   lurasidone 40 MG Tabs tablet Commonly known as: LATUDA Take 40 mg by mouth every evening.   methocarbamol 500 MG tablet Commonly known as: ROBAXIN Take 1 tablet (500 mg total) by mouth 2 (two) times daily.   metoprolol succinate 25 MG 24 hr tablet Commonly known as: TOPROL-XL Take 1 tablet (25 mg total) by mouth daily.   montelukast 10 MG tablet Commonly known as: SINGULAIR Take 10 mg by mouth daily.   nicotine 14 mg/24hr patch Commonly known as: NICODERM CQ - dosed in mg/24 hours Place 1 patch (14 mg total) onto the skin daily as needed (nicotine  use).   nitroGLYCERIN 0.4 MG SL tablet Commonly known as: NITROSTAT Place 1 tablet (0.4 mg total) under the tongue every 5 (five) minutes as needed.   omeprazole 20 MG capsule Commonly known as: PRILOSEC Take 20 mg by mouth daily.   ondansetron 4 MG tablet Commonly known as: ZOFRAN  Take 1 tablet (4 mg total) by mouth every 6 (six) hours as needed for nausea or vomiting.   OXcarbazepine 150 MG tablet Commonly known as: TRILEPTAL Take 1 tablet (150 mg total) by mouth 2 (two) times daily.   sertraline 25 MG tablet Commonly known as: ZOLOFT Take 3 tablets (75 mg total) by mouth daily.   traZODone 100 MG tablet Commonly known as: DESYREL Take 100 mg by mouth at bedtime.   triamcinolone ointment 0.1 % Commonly known as: KENALOG Apply topically 2 (two) times daily.   trospium 20 MG tablet Commonly known as: SANCTURA TAKE 1 TABLET(20 MG) BY MOUTH TWICE DAILY   Varenicline Tartrate (Starter) 0.5 MG X 11 & 1 MG X 42 Tbpk Take by mouth.        Outstanding Labs/Studies   FLP/LFTs in 8 weeks  Echocardiogram  Duration of Discharge Encounter   Greater than 30 minutes including physician time.  Signed, Laverda Page, NP 04/05/2023, 1:50 PM

## 2023-04-05 NOTE — Progress Notes (Signed)
CARDIAC REHAB PHASE I     Post stent education including site care, restrictions, risk factors, exercise guidelines, NTG use, antiplatelet therapy importance, heart healthy diet, smoking cessation and CRP2 reviewed. All questions and concerns addressed. Will refer to [facility] for CRP2. Plan for home later today.    [id number] Woodroe Chen, RN BSN 04/05/2023 2:15 PM

## 2023-04-05 NOTE — Discharge Instructions (Signed)

## 2023-04-05 NOTE — Progress Notes (Signed)
Cardiology Office Note:  .   Date:  04/13/2023  ID:  [name], DOB [birth year], MRN [id number] PCP: Estevan Oaks, NP  [facility] Providers Cardiologist:  Bryan Lemma, MD    History of Present Illness: .   [name] is a 52 y.o. 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 Patient is followed by Dr. Herbie Baltimore and presents today a hospital follow up appointment.   Patient presented to the ED on 10/9 complaining of chest pain that had been going on for 2 weeks.  Pain was worse with exertion, relieved with rest.  EKG showed sinus rhythm, heart rate 74 bpm, nonspecific T wave abnormalities.  High-sensitivity troponin negative x 2.  Patient's description of pain was concerning for progressive angina.  She was admitted to the hospital under the cardiology service.  Underwent left heart catheterization on 04/05/2023.  Found to have 99% stenosis in mid LAD, 65% stenosis in proximal RCA, 99% stenosis in first diagonal.  Her subtotally occluded mid LAD was treated with 2 overlapping DES.  Recommended dual antiplatelet therapy for at least 6 months, preferably 1 year.  Patient was a same-day discharge after her PCI.  Today, patient reports that she has been having intermittent episodes of shortness of breath. She reports that these episodes occur randomly, not necessarily related to exertion or body position, and are not associated with coughing. She is wondering if shortness of breath is a side effect from Brilinta vs anxiety. Notes that shortness of breath resolves if she takes a few deep breaths. She has been maintaining an active lifestyle, walking daily, sometimes twice a day. Denies shortness of breath or chest pain on exertion. Right radial cath site has been healing well and is not painful.   The patient also reports experiencing daily headaches, particularly when  studying or focusing on screens. Has not been wearing her glasses when reading or looking at computer screen. She has been off her cholesterol medication for approximately five to six months, as per her primary care physician's instructions. After starting DAPT, she experienced an episode of vaginal bleeding, which lasted for about six hours. This was concerning for the patient as she has been postmenopausal for over a year. Has not had vaginal bleeding since. She has quit smoking and has not resumed.   ROS: Patient denies chest pain, DOE, palpitations. Has headaches, occasional/random episodes of SOB   Studies Reviewed: .   Cardiac Studies & Procedures   CARDIAC CATHETERIZATION  CARDIAC CATHETERIZATION 04/05/2023  Narrative   Mid LAD lesion is 99% stenosed.   Prox RCA lesion is 65% stenosed.   1st Diag lesion is 99% stenosed.   A stent was successfully placed.   Post intervention, there is a 0% residual stenosis.  1.  Subtotally occluded mid LAD treated with 2 overlapping drug-eluting stents with OCT optimization. 2.  RFR negative proximal right coronary artery disease. 3.  LVEDP of 6 mmHg.  Recommendation: The results were reviewed with Dr. Herbie Baltimore as well as the patient's mother and daughter over the phone.  Dual antiplatelet therapy for at least 6 months and preferably 1 year.  Same-day discharge with close hospital follow-up.  Findings Coronary Findings Diagnostic  Dominance: Right  Left Anterior Descending Collaterals Dist LAD filled by collaterals from RPDA.  Mid LAD lesion is 99% stenosed.  First Diagonal Branch 1st Diag lesion is 99% stenosed.  Second Product manager  Collaterals 2nd Diag filled by collaterals from RPDA.  Third Diagonal Branch Collaterals 3rd Diag filled by collaterals from RPDA.  Right Coronary Artery Prox RCA lesion is 65% stenosed.  Intervention  Mid LAD lesion Stent A stent was successfully placed. Post-Intervention Lesion  Assessment The intervention was successful. Pre-interventional TIMI flow is 1. Post-intervention TIMI flow is 3. There is a 0% residual stenosis post intervention.                Risk Assessment/Calculations:             Physical Exam:   VS:  BP 116/78   Pulse 84   Ht 5\' 3"  (1.6 m)   Wt 191 lb 6.4 oz (86.8 kg)   SpO2 95%   BMI 33.90 kg/m    Wt Readings from Last 3 Encounters:  04/13/23 191 lb 6.4 oz (86.8 kg)  04/04/23 192 lb (87.1 kg)  12/21/22 184 lb 3.2 oz (83.6 kg)    GEN: Well nourished, well developed in no acute distress. Sitting comfortably on the exam table  NECK: No JVD CARDIAC: RRR, no murmurs, rubs, gallops. Radial pulses 2+ bilaterally. Right radial cath site soft, nontender  RESPIRATORY:  Clear to auscultation without rales, wheezing or rhonchi. Normal work of breathing on room air ABDOMEN: Soft, non-tender, non-distended EXTREMITIES:  No edema in BLE; No deformity   ASSESSMENT AND PLAN: .    CAD SOB Vaginal bleeding  -Patient underwent cardiac catheterization on 04/05/2023.  Found to have 99% stenosis in mid LAD that was treated with 2 overlapping DES.  There was also 65% stenosis in proximal RCA, 99% stenosis in first diagonal that will be managed medically. - She has been walking 1-2 times per day since being discharged- denies chest pain or shortness of breath on exertion.  - Right radial cath site soft, nontender. Healing normally  -Currently on aspirin, Brilinta.  After starting DAPT, she had one episode of vaginal bleeding which lasted for about 6 hours. Has not had vaginal bleeding since. Occasionally has blood on toilet paper after having a BM. Ordered CBC. As patient is postmenopausal, instructed her to follow up with her gynecologist about vaginal bleeding. Has appointment next week with gynecologist  - Patient notes occasional SOB that happens when she is at rest. Happens randomly, goes away if she takes a few deep breaths. Possible Brilinta side  effect vs anxiety. Instructed her to take Brilinta with caffeine to see if SOB improves.  - Continue DAPT with ASA, Brilinta. If she continues to have shortness of breath on Brilinta at follow up appointment in 2-3 months, may be able to transition to plavix if ok with Dr. Herbie Baltimore.  -Patient has not been taking cholesterol medication for the past 6 months. Was previously on lipitor. Resume lipitor 80 mg daily  -Continue metoprolol succinate 25 mg daily - Discussed low sodium diet, mediterranean diet   HLD  - LDL 30 in 03/2023  - Patient has not been taking lipitor, reports it was stopped by her PCP - Given recent stenting, resume lipitor 80 mg daily  - Plan to recheck lipid panel and LFTs at follow up in 2-3 months   Hypertension - BP well controlled  - Continue metoprolol succinate 25 mg daily   Anxiety, depression -Managed by PCP  Tobacco use - Patient recently quit smoking and has not resumed. Congratulated patient on this progress     Cardiac Rehabilitation Eligibility Assessment  The patient is ready to start cardiac rehabilitation from  a cardiac standpoint.       Dispo: Follow up in 2-3 months with APP   Signed, Jonita Albee, PA-C

## 2023-04-05 NOTE — Telephone Encounter (Signed)
Transition of Care Follow-up Phone Call Request    Patient Name: [name] Date of Birth: 1971-01-02 Date of Encounter: 04/05/2023  Primary Care Provider:  Estevan Oaks, NP Primary Cardiologist:  Bryan Lemma, MD  [name] has been scheduled for a transition of care follow up appointment with a [facility] provider:  Robet Leu 10/18  Please reach out to [name] within 48 hours of discharge to confirm appointment and review transition of care protocol questionnaire. Anticipated discharge date: 10/10  Laverda Page, NP  04/05/2023, 4:38 PM

## 2023-04-05 NOTE — TOC Benefit Eligibility Note (Signed)
Patient Product/process development scientist completed.    The patient is insured through [employer]. Patient has Medicare and is not eligible for a copay card, but may be able to apply for patient assistance, if available.    Ran test claim for Brilinta 90 mg and the current 30 day co-pay is $0.00.   This test claim was processed through [facility]- copay amounts may vary at other pharmacies due to pharmacy/plan contracts, or as the patient moves through the different stages of their insurance plan.     Roland Earl, CPHT Pharmacy Technician III Certified Patient Advocate [facility] Patient Advocate Team Direct Number: [phone]  Fax: [phone]

## 2023-04-06 ENCOUNTER — Encounter (HOSPITAL_COMMUNITY): Payer: Self-pay | Admitting: Internal Medicine

## 2023-04-09 ENCOUNTER — Telehealth (HOSPITAL_COMMUNITY): Payer: Self-pay

## 2023-04-09 NOTE — Telephone Encounter (Signed)
Called patient to see if she is interested in the Cardiac Rehab Program. Patient expressed interest. Explained scheduling process and went over insurance, patient verbalized understanding. Will contact patient for scheduling once f/u has been completed.

## 2023-04-09 NOTE — Telephone Encounter (Signed)
Pt insurance is active and benefits verified through [facility] Medicare Dual Complete. Co-pay $0.00, DED $240.00/$240.00 met, out of pocket $8,850.00/$513.70 met, co-insurance 20%. No pre-authorization required. Passport, 04/09/23 @ 2:25PM, REF#[id number]   How many CR sessions are covered? (36 visits for TCR, 72 visits for ICR)72 Is this a lifetime maximum or an annual maximum? Annual Has the member used any of these services to date? No Is there a time limit (weeks/months) on start of program and/or program completion? No     Will contact patient to see if she is interested in the Cardiac Rehab Program. If interested, patient will need to complete follow up appt. Once completed, patient will be contacted for scheduling upon review by the RN Navigator.

## 2023-04-13 ENCOUNTER — Encounter: Payer: Self-pay | Admitting: Cardiology

## 2023-04-13 ENCOUNTER — Ambulatory Visit: Payer: 59 | Admitting: Cardiology

## 2023-04-13 ENCOUNTER — Ambulatory Visit: Payer: 59 | Attending: Cardiology | Admitting: Cardiology

## 2023-04-13 VITALS — BP 116/78 | HR 84 | Ht 63.0 in | Wt 191.4 lb

## 2023-04-13 DIAGNOSIS — Z87891 Personal history of nicotine dependence: Secondary | ICD-10-CM

## 2023-04-13 DIAGNOSIS — R0600 Dyspnea, unspecified: Secondary | ICD-10-CM | POA: Diagnosis not present

## 2023-04-13 DIAGNOSIS — Z955 Presence of coronary angioplasty implant and graft: Secondary | ICD-10-CM

## 2023-04-13 DIAGNOSIS — I10 Essential (primary) hypertension: Secondary | ICD-10-CM

## 2023-04-13 DIAGNOSIS — N939 Abnormal uterine and vaginal bleeding, unspecified: Secondary | ICD-10-CM

## 2023-04-13 DIAGNOSIS — F32A Depression, unspecified: Secondary | ICD-10-CM

## 2023-04-13 DIAGNOSIS — E782 Mixed hyperlipidemia: Secondary | ICD-10-CM

## 2023-04-13 DIAGNOSIS — F411 Generalized anxiety disorder: Secondary | ICD-10-CM

## 2023-04-13 MED ORDER — ATORVASTATIN CALCIUM 80 MG PO TABS: 80.0000 mg | ORAL_TABLET | Freq: Every day | ORAL | 3 refills | Status: DC

## 2023-04-13 NOTE — Patient Instructions (Signed)
Medication Instructions:  Restart Lipitor 80 mg once a day *If you need a refill on your cardiac medications before your next appointment, please call your pharmacy*   Lab Work: CBC will be drawn today If you have labs (blood work) drawn today and your tests are completely normal, you will receive your results only by: MyChart Message (if you have MyChart) OR A paper copy in the mail If you have any lab test that is abnormal or we need to change your treatment, we will call you to review the results.   Testing/Procedures: No testing   Follow-Up: At [facility], you and your health needs are our priority.  As part of our continuing mission to provide you with exceptional heart care, we have created designated Provider Care Teams.  These Care Teams include your primary Cardiologist (physician) and Advanced Practice Providers (APPs -  Physician Assistants and Nurse Practitioners) who all work together to provide you with the care you need, when you need it.  We recommend signing up for the patient portal called "MyChart".  Sign up information is provided on this After Visit Summary.  MyChart is used to connect with patients for Virtual Visits (Telemedicine).  Patients are able to view lab/test results, encounter notes, upcoming appointments, etc.  Non-urgent messages can be sent to your provider as well.   To learn more about what you can do with MyChart, go to [email or web address].    Your next appointment:   2-3 month(s)  Provider:   Any APP

## 2023-04-14 LAB — CBC
Hematocrit: 36.8 % (ref 34.0–46.6)
Hemoglobin: 11.7 g/dL (ref 11.1–15.9)
MCH: 28.1 pg (ref 26.6–33.0)
MCHC: 31.8 g/dL (ref 31.5–35.7)
MCV: 88 fL (ref 79–97)
Platelets: 279 10*3/uL (ref 150–450)
RBC: 4.17 x10E6/uL (ref 3.77–5.28)
RDW: 15.2 % (ref 11.7–15.4)
WBC: 9.6 10*3/uL (ref 3.4–10.8)

## 2023-04-16 ENCOUNTER — Telehealth: Payer: Self-pay

## 2023-04-16 NOTE — Telephone Encounter (Signed)
Called patient advised of below they verbalized an understanding.

## 2023-04-16 NOTE — Telephone Encounter (Signed)
-----   Message from Jonita Albee sent at 04/14/2023 11:18 AM EDT ----- Patient does not have mychart- please let her know that her CBC was normal. No changes to medications   Thanks KJ

## 2023-04-24 ENCOUNTER — Ambulatory Visit: Payer: 59 | Attending: Obstetrics and Gynecology

## 2023-04-26 ENCOUNTER — Ambulatory Visit: Payer: 59 | Admitting: Obstetrics and Gynecology

## 2023-06-04 ENCOUNTER — Emergency Department (HOSPITAL_COMMUNITY)
Admission: EM | Admit: 2023-06-04 | Discharge: 2023-06-04 | Disposition: A | Discharge status: Home / Self Care | Payer: 59 | Attending: Emergency Medicine | Admitting: Emergency Medicine

## 2023-06-04 ENCOUNTER — Other Ambulatory Visit: Payer: Self-pay

## 2023-06-04 ENCOUNTER — Encounter (HOSPITAL_COMMUNITY): Payer: Self-pay | Admitting: Emergency Medicine

## 2023-06-04 DIAGNOSIS — Z91040 Latex allergy status: Secondary | ICD-10-CM | POA: Diagnosis not present

## 2023-06-04 DIAGNOSIS — Z7982 Long term (current) use of aspirin: Secondary | ICD-10-CM | POA: Diagnosis not present

## 2023-06-04 DIAGNOSIS — R1110 Vomiting, unspecified: Secondary | ICD-10-CM | POA: Insufficient documentation

## 2023-06-04 DIAGNOSIS — R21 Rash and other nonspecific skin eruption: Secondary | ICD-10-CM | POA: Insufficient documentation

## 2023-06-04 LAB — LIPASE, BLOOD: Lipase: 23 U/L (ref 11–51)

## 2023-06-04 LAB — RAPID URINE DRUG SCREEN, HOSP PERFORMED
Amphetamines: NOT DETECTED
Barbiturates: NOT DETECTED
Benzodiazepines: NOT DETECTED
Cocaine: NOT DETECTED
Opiates: NOT DETECTED
Tetrahydrocannabinol: NOT DETECTED

## 2023-06-04 LAB — COMPREHENSIVE METABOLIC PANEL
ALT: 41 U/L (ref 0–44)
AST: 33 U/L (ref 15–41)
Albumin: 4.1 g/dL (ref 3.5–5.0)
Alkaline Phosphatase: 130 U/L — ABNORMAL HIGH (ref 38–126)
Anion gap: 11 (ref 5–15)
BUN: 11 mg/dL (ref 6–20)
CO2: 25 mmol/L (ref 22–32)
Calcium: 9.8 mg/dL (ref 8.9–10.3)
Chloride: 101 mmol/L (ref 98–111)
Creatinine, Ser: 0.56 mg/dL (ref 0.44–1.00)
GFR, Estimated: >60 mL/min (ref >60)
Glucose, Bld: 100 mg/dL — ABNORMAL HIGH (ref 70–99)
Potassium: 3.5 mmol/L (ref 3.5–5.1)
Sodium: 137 mmol/L (ref 135–145)
Total Bilirubin: 0.2 mg/dL (ref <1.2)
Total Protein: 9.6 g/dL — ABNORMAL HIGH (ref 6.5–8.1)

## 2023-06-04 LAB — URINALYSIS, ROUTINE W REFLEX MICROSCOPIC
Bilirubin Urine: NEGATIVE
Glucose, UA: NEGATIVE mg/dL
Hgb urine dipstick: NEGATIVE
Ketones, ur: NEGATIVE mg/dL
Leukocytes,Ua: NEGATIVE
Nitrite: NEGATIVE
Protein, ur: NEGATIVE mg/dL
Specific Gravity, Urine: 1.028 (ref 1.005–1.030)
pH: 5 (ref 5.0–8.0)

## 2023-06-04 LAB — CBC
HCT: 38.8 % (ref 36.0–46.0)
Hemoglobin: 12.5 g/dL (ref 12.0–15.0)
MCH: 27.8 pg (ref 26.0–34.0)
MCHC: 32.2 g/dL (ref 30.0–36.0)
MCV: 86.4 fL (ref 80.0–100.0)
Platelets: 309 10*3/uL (ref 150–400)
RBC: 4.49 MIL/uL (ref 3.87–5.11)
RDW: 16.6 % — ABNORMAL HIGH (ref 11.5–15.5)
WBC: 10.5 10*3/uL (ref 4.0–10.5)
nRBC: 0 % (ref 0.0–0.2)

## 2023-06-04 LAB — HCG, SERUM, QUALITATIVE: Preg, Serum: NEGATIVE

## 2023-06-04 MED ORDER — ONDANSETRON HCL 4 MG/2ML IJ SOLN
4.0000 mg | Freq: Once | INTRAMUSCULAR | Status: AC
  Administered 2023-06-04: 4 mg via INTRAVENOUS
  Filled 2023-06-04: qty 2

## 2023-06-04 MED ORDER — SODIUM CHLORIDE 0.9 % IV BOLUS
1000.0000 mL | Freq: Once | INTRAVENOUS | Status: AC
  Administered 2023-06-04: 1000 mL via INTRAVENOUS

## 2023-06-04 MED ORDER — OXYCODONE-ACETAMINOPHEN 5-325 MG PO TABS
1.0000 | ORAL_TABLET | Freq: Once | ORAL | Status: AC
  Administered 2023-06-04: 1 via ORAL
  Filled 2023-06-04: qty 1

## 2023-06-04 MED ORDER — ONDANSETRON 4 MG PO TBDP
ORAL_TABLET | ORAL | 0 refills | Status: DC
Start: 2023-06-04 — End: 2024-01-30

## 2023-06-04 MED ORDER — PREDNISONE 20 MG PO TABS: ORAL_TABLET | ORAL | 0 refills | Status: DC

## 2023-06-04 MED ORDER — KETOROLAC TROMETHAMINE 30 MG/ML IJ SOLN
30.0000 mg | Freq: Once | INTRAMUSCULAR | Status: AC
  Administered 2023-06-04: 30 mg via INTRAVENOUS
  Filled 2023-06-04: qty 1

## 2023-06-04 NOTE — ED Provider Notes (Signed)
[place] EMERGENCY DEPARTMENT AT [facility] Provider Note   CSN: [id number] Arrival date & time: 06/04/23  1310     History {Add pertinent medical, surgical, social history, OB history to HPI:1} Chief Complaint  Patient presents with   Emesis   Rash    [name] is a 52 y.o. female.  Patient states she has been having some vomiting and a rash throughout her face   Emesis Rash Associated symptoms: vomiting        Home Medications Prior to Admission medications   Medication Sig Start Date End Date Taking? Authorizing Provider  ondansetron (ZOFRAN-ODT) 4 MG disintegrating tablet 4mg  ODT q4 hours prn nausea/vomit 06/04/23  Yes Bethann Berkshire, MD  predniSONE (DELTASONE) 20 MG tablet 2 tabs po daily x 3 days 06/04/23  Yes Bethann Berkshire, MD  acetaminophen (TYLENOL) 500 MG tablet Take 1 tablet (500 mg total) by mouth in the morning, at noon, in the evening, and at bedtime. 07/09/22   Derwood Kaplan, MD  ADVAIR [facility] 115-21 MCG/ACT inhaler Inhale 2 puffs into the lungs 2 (two) times daily. 04/03/22   [provider]  albuterol (VENTOLIN HFA) 108 (90 Base) MCG/ACT inhaler Inhale 1-2 puffs into the lungs every 6 (six) hours as needed for wheezing or shortness of breath. 04/11/21   Merrilee Jansky, MD  aspirin 81 MG chewable tablet Chew 1 tablet (81 mg total) by mouth daily. 04/06/23   Arty Baumgartner, NP  atorvastatin (LIPITOR) 80 MG tablet Take 1 tablet (80 mg total) by mouth daily. 04/13/23 07/12/23  Jonita Albee, PA-C  escitalopram (LEXAPRO) 10 MG tablet Take 10 mg by mouth daily. 03/28/23   [provider]  fluticasone (FLONASE) 50 MCG/ACT nasal spray Place 1 spray into both nostrils daily. 10/24/22   [provider]  gabapentin (NEURONTIN) 600 MG tablet Take 600 mg by mouth 3 (three) times daily. 04/03/22   [provider]  lidocaine (LIDODERM) 5 % Place 1 patch onto the skin daily. Remove & Discard patch within 12 hours  or as directed by MD 02/05/23   Dyanne Iha, MD  lurasidone (LATUDA) 40 MG TABS tablet Take 40 mg by mouth every evening. 03/28/23   [provider]  methocarbamol (ROBAXIN) 500 MG tablet Take 1 tablet (500 mg total) by mouth 2 (two) times daily. 02/05/23   Dyanne Iha, MD  metoprolol succinate (TOPROL-XL) 25 MG 24 hr tablet Take 1 tablet (25 mg total) by mouth daily. 04/05/23   Arty Baumgartner, NP  montelukast (SINGULAIR) 10 MG tablet Take 10 mg by mouth daily. 05/25/22   [provider]  nicotine (NICODERM CQ - DOSED IN MG/24 HOURS) 14 mg/24hr patch Place 1 patch (14 mg total) onto the skin daily as needed (nicotine use). 08/06/21   Princess Bruins, DO  nitroGLYCERIN (NITROSTAT) 0.4 MG SL tablet Place 1 tablet (0.4 mg total) under the tongue every 5 (five) minutes as needed. 04/05/23   Arty Baumgartner, NP  omeprazole (PRILOSEC) 20 MG capsule Take 20 mg by mouth daily. 02/07/22   [provider]  ondansetron (ZOFRAN) 4 MG tablet Take 1 tablet (4 mg total) by mouth every 6 (six) hours as needed for nausea or vomiting. 05/05/22   Unk Lightning, PA  ticagrelor (BRILINTA) 90 MG TABS tablet Take 1 tablet (90 mg total) by mouth 2 (two) times daily. 04/05/23   Arty Baumgartner, NP  traZODone (DESYREL) 100 MG tablet Take 100 mg by mouth at bedtime.  03/30/22   [provider]  triamcinolone ointment (KENALOG) 0.1 % Apply topically 2 (two) times daily. 03/04/22   [provider]  trospium (SANCTURA) 20 MG tablet TAKE 1 TABLET(20 MG) BY MOUTH TWICE DAILY 03/30/23   Lorriane Shire, MD  Varenicline Tartrate, Starter, 0.5 MG X 11 & 1 MG X 42 TBPK Take by mouth. 06/14/22   [provider]  clonazePAM (KLONOPIN) 0.5 MG tablet Take 0.5 mg by mouth 3 (three) times daily as needed for anxiety.  04/06/20  [provider]  pantoprazole (PROTONIX) 40 MG tablet Take 40 mg by mouth daily.  10/23/13 04/06/20  [provider]       Allergies    Benadryl [diphenhydramine], Penicillins, and Latex    Review of Systems   Review of Systems  Gastrointestinal:  Positive for vomiting.  Skin:  Positive for rash.    Physical Exam Updated Vital Signs BP (!) 147/100   Pulse 88   Temp (!) 97.5 F (36.4 C) (Oral)   Resp 18   Ht 5' 3.5" (1.613 m)   Wt 81.6 kg   SpO2 100%   BMI 31.39 kg/m  Physical Exam  ED Results / Procedures / Treatments   Labs (all labs ordered are listed, but only abnormal results are displayed) Labs Reviewed  COMPREHENSIVE METABOLIC PANEL - Abnormal; Notable for the following components:      Result Value   Glucose, Bld 100 (*)    Total Protein 9.6 (*)    Alkaline Phosphatase 130 (*)    All other components within normal limits  CBC - Abnormal; Notable for the following components:   RDW 16.6 (*)    All other components within normal limits  URINALYSIS, ROUTINE W REFLEX MICROSCOPIC - Abnormal; Notable for the following components:   APPearance HAZY (*)    All other components within normal limits  LIPASE, BLOOD  HCG, SERUM, QUALITATIVE  RAPID URINE DRUG SCREEN, HOSP PERFORMED  RPR  HIV ANTIBODY (ROUTINE TESTING W REFLEX)    EKG None  Radiology No results found.  Procedures Procedures  {Document cardiac monitor, telemetry assessment procedure when appropriate:1}  Medications Ordered in ED Medications  oxyCODONE-acetaminophen (PERCOCET/ROXICET) 5-325 MG per tablet 1 tablet (has no administration in time range)  sodium chloride 0.9 % bolus 1,000 mL (0 mLs Intravenous Stopped 06/04/23 1905)  ondansetron (ZOFRAN) injection 4 mg (4 mg Intravenous Given 06/04/23 1717)  ketorolac (TORADOL) 30 MG/ML injection 30 mg (30 mg Intravenous Given 06/04/23 1717)    ED Course/ Medical Decision Making/ A&P   {   Click here for ABCD2, HEART and other calculatorsREFRESH Note before signing :1}                              Medical Decision Making Amount and/or Complexity of Data  Reviewed Labs: ordered.  Risk Prescription drug management.   Patient with vomiting and normal labs.  She is given Zofran and she is given prednisone for her rash to her face  {Document critical care time when appropriate:1} {Document review of labs and clinical decision tools ie heart score, Chads2Vasc2 etc:1}  {Document your independent review of radiology images, and any outside records:1} {Document your discussion with family members, caretakers, and with consultants:1} {Document social determinants of health affecting pt's care:1} {Document your decision making why or why not admission, treatments were needed:1} Final Clinical Impression(s) / ED Diagnoses Final diagnoses:  Rash    Rx /  DC Orders ED Discharge Orders          Ordered    ondansetron (ZOFRAN-ODT) 4 MG disintegrating tablet        06/04/23 2049    predniSONE (DELTASONE) 20 MG tablet        06/04/23 2049

## 2023-06-04 NOTE — ED Provider Triage Note (Signed)
Emergency Medicine Provider Triage Evaluation Note  [name] , a 52 y.o. female  was evaluated in triage.  Pt complains of n/v. Endorsed several days of having nausea and vomiting, occasionally having some loose stool.  Also report noticing a rash to face x 2 days.  No fever, headache, neck stiffness, abd pain or dysuria.  Has remote syphilis.  Is sexually active.  No change in facial cream  Review of Systems  Positive: As above Negative: As above  Physical Exam  BP (!) 151/91 (BP Location: Right Arm)   Pulse 96   Temp 97.9 F (36.6 C) (Oral)   Resp 16   Ht 5' 3.5" (1.613 m)   Wt 81.6 kg   SpO2 97%   BMI 31.39 kg/m  Gen:   Awake, no distress   Resp:  Normal effort  MSK:   Moves extremities without difficulty  Other:    Medical Decision Making  Medically screening exam initiated at 1:57 PM.  Appropriate orders placed.  [name] was informed that the remainder of the evaluation will be completed by another provider, this initial triage assessment does not replace that evaluation, and the importance of remaining in the ED until their evaluation is complete.     Fayrene Helper, PA-C 06/04/23 1359

## 2023-06-04 NOTE — Discharge Instructions (Signed)
Take Benadryl for rash and itching, follow-up with your family doctor this week for recheck.  Take liquids only for the next 24 hours

## 2023-06-04 NOTE — ED Triage Notes (Signed)
Patient presents due to nausea and vomiting since last Tuesday. She has been unable to hold anything down. She also complains of a headache and a rash on her face.

## 2023-06-05 LAB — RPR
RPR Ser Ql: REACTIVE — AB
RPR Titer: 1:2 {titer}

## 2023-06-05 LAB — HIV ANTIBODY (ROUTINE TESTING W REFLEX): HIV Screen 4th Generation wRfx: NONREACTIVE

## 2023-06-06 LAB — T.PALLIDUM AB, TOTAL: T Pallidum Abs: REACTIVE — AB

## 2023-06-07 NOTE — Progress Notes (Unsigned)
Cardiology Office Note:   Date:  06/13/2023  ID:  [name], DOB 1971-05-15, MRN [id number] PCP: Estevan Oaks, NP  [facility] Providers Cardiologist:  Bryan Lemma, MD     History of Present Illness:   [name] is a 52 y.o. female with a past medical history of CAD, major depressive disorder, anxiety disorder, polysubstance abuse with cocaine use currently greater than 18 months sober, hypertension, help edema.  She was hospitalized 04/05/2023 with NSTEMI.  LHC showed 99% stenosis of the mid LAD treated with 2 overlapping DES.  She has 65% stenosis in the proximal LAD and 90% stenosis in the D1.  Significant collateral flow was noted.  Discussed the use of AI scribe software for clinical note transcription with the patient, who gave verbal consent to proceed.  History of Present Illness   The patient, with a history of depression, anxiety, polysubstance abuse, hypertension, and hyperlipidemia, and CAD, presents for follow up post PCI. The patient also reports rectal bleeding, which is bright red and noticeable when wiping. The patient has been taking aspirin and Brilinta since the placement of two overlapping stents in the LAD vessel of the heart. The patient mentions a desire to quit smoking and lose weight. The patient also reports a history of stomach issues and has recently experienced an increase in these symptoms, including vomiting and frequent bowel movements. The patient has not been taking any new medications and these symptoms started about a month ago. She did not take any medication for 2 days due to vomiting, no current chest pain. She has not been walking as she should due to cold weather. She states she never heard from cardiac rehab, but may have issues with transportation.   She used varenicline and stopped smoking but ran out of refills and started smoking again.          Today patient denies chest pain, shortness of breath, lower extremity  edema, fatigue, palpitations, melena, hematuria, hemoptysis, diaphoresis, weakness, presyncope, syncope, orthopnea, and PND.   Studies Reviewed:    EKG:   EKG Interpretation Date/Time:  Wednesday June 13 2023 09:01:34 EST Ventricular Rate:  82 PR Interval:  146 QRS Duration:  82 QT Interval:  388 QTC Calculation: 453 R Axis:   34  Text Interpretation: Normal sinus rhythm Minimal voltage criteria for LVH, may be normal variant ( Cornell product ) Anteroseptal infarct (cited on or before 05-Apr-2023) When compared with ECG of 05-Apr-2023 12:10, No significant change was found Confirmed by Micah Flesher ([id number]) on 06/13/2023 9:03:12 AM    Cardiac Studies & Procedures   CARDIAC CATHETERIZATION  CARDIAC CATHETERIZATION 04/05/2023  Narrative   Mid LAD lesion is 99% stenosed.   Prox RCA lesion is 65% stenosed.   1st Diag lesion is 99% stenosed.   A stent was successfully placed.   Post intervention, there is a 0% residual stenosis.  1.  Subtotally occluded mid LAD treated with 2 overlapping drug-eluting stents with OCT optimization. 2.  RFR negative proximal right coronary artery disease. 3.  LVEDP of 6 mmHg.  Recommendation: The results were reviewed with Dr. Herbie Baltimore as well as the patient's mother and daughter over the phone.  Dual antiplatelet therapy for at least 6 months and preferably 1 year.  Same-day discharge with close hospital follow-up.  Findings Coronary Findings Diagnostic  Dominance: Right  Left Anterior Descending Collaterals Dist LAD filled by collaterals from RPDA.  Mid LAD lesion is 99% stenosed.  First Product manager 1st  Diag lesion is 99% stenosed.  Second Diagonal Branch Collaterals 2nd Diag filled by collaterals from RPDA.  Third Diagonal Branch Collaterals 3rd Diag filled by collaterals from RPDA.  Right Coronary Artery Prox RCA lesion is 65% stenosed.  Intervention  Mid LAD lesion Stent A stent was successfully  placed. Post-Intervention Lesion Assessment The intervention was successful. Pre-interventional TIMI flow is 1. Post-intervention TIMI flow is 3. There is a 0% residual stenosis post intervention.                 Risk Assessment/Calculations:              Physical Exam:   VS:  BP 126/76 (BP Location: Left Arm, Patient Position: Sitting, Cuff Size: Normal)   Pulse 82   Ht 5' 3.5" (1.613 m)   Wt 192 lb 9.6 oz (87.4 kg)   SpO2 100%   BMI 33.58 kg/m    Wt Readings from Last 3 Encounters:  06/13/23 192 lb 9.6 oz (87.4 kg)  06/04/23 180 lb (81.6 kg)  04/13/23 191 lb 6.4 oz (86.8 kg)       Physical Exam   General: obese female in NAD CHEST: Lungs clear to auscultation, no wheezes. CARDIOVASCULAR: Heart sounds normal, no murmurs. EXTREMITIES: No swelling in lower extremities.  Psych: normal affect Neuro: A and O x 3      ASSESSMENT AND PLAN:     Assessment and Plan    Coronary Artery Disease Two overlapping stents in LAD. No current chest pain or shortness of breath. Patient is on Aspirin and Brilinta. -Continue Aspirin and Brilinta. -Refer to cardiac rehab. - switch from chewable ASA to [facility]  Gastrointestinal Bleeding Bright red blood per rectum, likely hemorrhoidal given the description. Recent changes in bowel habits with increased frequency and variable consistency. No current vomiting. -Refer to Gastroenterology for further evaluation. -Switch from chewable to enteric-coated Aspirin to protect gastric lining.  Tobacco Use Patient restarted smoking after running out of varenicline. -Refill varenicline starter pack and advise patient to restart medication.  Hyperlipidemia Elevated Lipoprotein A despite good control of other lipid parameters. -Refer to pharmacist for discussion regarding PCSK9 inhibitor.  Weight Management Patient expressed interest in weight loss assistance. -Refer to pharmacist for discussion regarding GLP-1 agonists.  Follow-up in 3.5  weeks to assess progress and adjust treatment as necessary. Will try to get pharmD appt on the same day.           Cardiac Rehabilitation Eligibility Assessment          Signed, Marcelino Duster, [place]

## 2023-06-13 ENCOUNTER — Ambulatory Visit: Payer: 59 | Attending: Physician Assistant | Admitting: Physician Assistant

## 2023-06-13 ENCOUNTER — Encounter: Payer: Self-pay | Admitting: Physician Assistant

## 2023-06-13 VITALS — BP 126/76 | HR 82 | Ht 63.5 in | Wt 192.6 lb

## 2023-06-13 DIAGNOSIS — R197 Diarrhea, unspecified: Secondary | ICD-10-CM | POA: Diagnosis not present

## 2023-06-13 DIAGNOSIS — R0600 Dyspnea, unspecified: Secondary | ICD-10-CM | POA: Diagnosis not present

## 2023-06-13 DIAGNOSIS — K625 Hemorrhage of anus and rectum: Secondary | ICD-10-CM

## 2023-06-13 DIAGNOSIS — Z9861 Coronary angioplasty status: Secondary | ICD-10-CM

## 2023-06-13 DIAGNOSIS — Z789 Other specified health status: Secondary | ICD-10-CM

## 2023-06-13 DIAGNOSIS — Z955 Presence of coronary angioplasty implant and graft: Secondary | ICD-10-CM

## 2023-06-13 MED ORDER — ATORVASTATIN CALCIUM 80 MG PO TABS: 80.0000 mg | ORAL_TABLET | Freq: Every day | ORAL | 3 refills | Status: AC

## 2023-06-13 MED ORDER — METOPROLOL SUCCINATE ER 25 MG PO TB24: 25.0000 mg | ORAL_TABLET | Freq: Every day | ORAL | 3 refills | Status: AC

## 2023-06-13 MED ORDER — VARENICLINE TARTRATE (STARTER) 0.5 MG X 11 & 1 MG X 42 PO TBPK: ORAL_TABLET | ORAL | 0 refills | Status: AC

## 2023-06-13 MED ORDER — TICAGRELOR 90 MG PO TABS: 90.0000 mg | ORAL_TABLET | Freq: Two times a day (BID) | ORAL | 2 refills | Status: AC

## 2023-06-13 MED ORDER — NITROGLYCERIN 0.4 MG SL SUBL: 0.4000 mg | SUBLINGUAL_TABLET | SUBLINGUAL | 3 refills | Status: AC | PRN

## 2023-06-13 NOTE — Addendum Note (Signed)
Addended by: Lamar Benes on: 06/13/2023 11:07 AM   Modules accepted: Orders

## 2023-06-13 NOTE — Patient Instructions (Signed)
Medication Instructions:  Please Start Asprin 81 mg EC tablet daily Stop Chewable Asprin 81 mg.  *If you need a refill on your cardiac medications before your next appointment, please call your pharmacy*   Lab Work: NONE ordered at this time of appointment     Testing/Procedures: NONE ordered at this time of appointment     Follow-Up: At [facility], you and your health needs are our priority.  As part of our continuing mission to provide you with exceptional heart care, we have created designated Provider Care Teams.  These Care Teams include your primary Cardiologist (physician) and Advanced Practice Providers (APPs -  Physician Assistants and Nurse Practitioners) who all work together to provide you with the care you need, when you need it.  We recommend signing up for the patient portal called "MyChart".  Sign up information is provided on this After Visit Summary.  MyChart is used to connect with patients for Virtual Visits (Telemedicine).  Patients are able to view lab/test results, encounter notes, upcoming appointments, etc.  Non-urgent messages can be sent to your provider as well.   To learn more about what you can do with MyChart, go to [email or web address].    Your next appointment:    3 weeks with Genelle Gather & Pharm-D (same day apt)  Provider:   Micah Flesher, PA-C        Other Instructions

## 2023-06-15 ENCOUNTER — Encounter (HOSPITAL_COMMUNITY): Payer: Self-pay

## 2023-06-15 ENCOUNTER — Telehealth (HOSPITAL_COMMUNITY): Payer: Self-pay

## 2023-06-15 NOTE — Telephone Encounter (Signed)
Attempted to call patient in regards to Cardiac Rehab - LM on VM Mailed letter 

## 2023-06-16 NOTE — Progress Notes (Signed)
Pt presents with nausea and vomiting.  UDS was indicated to assess for potential cannabinoid hyperemesis syndrome contributing to her nausea and vomiting

## 2023-06-29 ENCOUNTER — Ambulatory Visit: Payer: 59 | Attending: Nurse Practitioner | Admitting: Emergency Medicine

## 2023-06-29 ENCOUNTER — Encounter: Payer: Self-pay | Admitting: Nurse Practitioner

## 2023-06-29 VITALS — BP 126/78 | Ht 63.0 in | Wt 198.4 lb

## 2023-06-29 DIAGNOSIS — I10 Essential (primary) hypertension: Secondary | ICD-10-CM

## 2023-06-29 DIAGNOSIS — Z720 Tobacco use: Secondary | ICD-10-CM | POA: Diagnosis not present

## 2023-06-29 DIAGNOSIS — E785 Hyperlipidemia, unspecified: Secondary | ICD-10-CM

## 2023-06-29 DIAGNOSIS — I2510 Atherosclerotic heart disease of native coronary artery without angina pectoris: Secondary | ICD-10-CM

## 2023-06-29 NOTE — Patient Instructions (Addendum)
 Medication Instructions:  Continue current medications *If you need a refill on your cardiac medications before your next appointment, please call your pharmacy*   Lab Work: none If you have labs (blood work) drawn today and your tests are completely normal, you will receive your results only by: MyChart Message (if you have MyChart) OR A paper copy in the mail If you have any lab test that is abnormal or we need to change your treatment, we will call you to review the results.   Testing/Procedures: none   Follow-Up: At [facility], you and your health needs are our priority.  As part of our continuing mission to provide you with exceptional heart care, we have created designated Provider Care Teams.  These Care Teams include your primary Cardiologist (physician) and Advanced Practice Providers (APPs -  Physician Assistants and Nurse Practitioners) who all work together to provide you with the care you need, when you need it.  We recommend signing up for the patient portal called MyChart.  Sign up information is provided on this After Visit Summary.  MyChart is used to connect with patients for Virtual Visits (Telemedicine).  Patients are able to view lab/test results, encounter notes, upcoming appointments, etc.  Non-urgent messages can be sent to your provider as well.   To learn more about what you can do with MyChart, go to [email or web address].    Your next appointment:   3-4  month(s)  Provider:   Jon Hails, PA Other Instructions Please keep your appt with Pharmacy on 07/27/23 @8 :30

## 2023-06-29 NOTE — Progress Notes (Signed)
 Cardiology Office Note:    Date:  06/29/2023  ID:  [name], DOB [birth year], MRN [id number] PCP: Delores Rojelio Caldron, NP  [facility] Providers Cardiologist:  Alm Clay, MD       Patient Profile:      [name] is a 53 year old female with visit pertinent history of CAD, major depressive disorder, anxiety disorder, polysubstance abuse with cocaine use, hypertension.  She was hospitalized in 03/2023 with NSTEMI.  LHC at that time showed 99% stenosis of the mid LAD treated with 2 overlapping DES.  She had 65% stenosis in the proximal LAD and 90% stenosis in the D1.  Last seen in clinic by Jon Hails, PA on 06/13/2023.  She had noted some bright red blood per rectum that was likely hemorrhoidal given the description when she was referred to GI.  It was noted that when she had began smoking cigarettes again and her varenicline  starter pack was refilled.  She was noted to have an elevated lipoprotein a and referred to Pharm.D. for discussion regarding PCSK9 inhibitor.      History of Present Illness:  Discussed the use of AI scribe software for clinical note transcription with the patient, who gave verbal consent to proceed.  [name] is a 53 y.o. female who returns for 3-week follow-up for CAD.  Today patient is doing well from a cardiac standpoint.  She is without any cardiovascular concerns or complaints today.  She notes that her shortness of breath has resolved following taking her Brilinta  with coffee in the morning.  She notes continued adherence to her medication regimen.  She does tell me that she does continue to smoke around 5 cigarettes/day.  She had started varenicline  about 3 weeks ago but she has not been able to decrease the amount that she is smoking.  She has a desire to lose weight and plans to focus on lifestyle changes in 2025.  She notes her gastrointestinal bleeding has improved she just has minimal blood when she wipes, she does plan on seeing  GI for this.  She has hopes to participate in cardiac rehab.  She denies chest pain, lower extremity edema, fatigue, palpitations, melena, hematuria, hemoptysis, diaphoresis, weakness, presyncope, syncope, orthopnea, and PND.          Review of Systems  Constitutional: Negative for weight gain and weight loss.  Cardiovascular:  Negative for chest pain, claudication, cyanosis, dyspnea on exertion, irregular heartbeat, leg swelling, near-syncope, orthopnea, palpitations, paroxysmal nocturnal dyspnea and syncope.  Respiratory:  Negative for cough, hemoptysis and shortness of breath.   Gastrointestinal:  Negative for abdominal pain, hematochezia and melena.  Genitourinary:  Negative for hematuria.  Neurological:  Negative for dizziness and light-headedness.     See HPI    Studies Reviewed:       Cardiac Catheterization 04/05/2023   Mid LAD lesion is 99% stenosed.   Prox RCA lesion is 65% stenosed.   1st Diag lesion is 99% stenosed.   A stent was successfully placed.   Post intervention, there is a 0% residual stenosis.   1.  Subtotally occluded mid LAD treated with 2 overlapping drug-eluting stents with OCT optimization. 2.  RFR negative proximal right coronary artery disease. 3.  LVEDP of 6 mmHg. Intervention   Risk Assessment/Calculations:             Physical Exam:   VS:  BP 126/78 (BP Location: Left Arm, Patient Position: Sitting, Cuff Size: Normal)   Ht 5' 3 (1.6  m)   Wt 198 lb 6.4 oz (90 kg)   SpO2 90%   BMI 35.14 kg/m    Wt Readings from Last 3 Encounters:  06/29/23 198 lb 6.4 oz (90 kg)  06/13/23 192 lb 9.6 oz (87.4 kg)  06/04/23 180 lb (81.6 kg)    Constitutional:      Appearance: Normal and healthy appearance.  HENT:     Head: Normocephalic.  Neck:     Vascular: JVD normal.  Pulmonary:     Effort: Pulmonary effort is normal.     Breath sounds: Normal breath sounds.  Chest:     Chest wall: Not tender to palpatation.  Cardiovascular:     PMI at left  midclavicular line. Normal rate. Regular rhythm. Normal S1. Normal S2.      Murmurs: There is no murmur.     No gallop.  No click. No rub.  Pulses:    Intact distal pulses.  Edema:    Peripheral edema absent.  Musculoskeletal: Normal range of motion.     Cervical back: Normal range of motion and neck supple. Skin:    General: Skin is warm and dry.  Neurological:     General: No focal deficit present.     Mental Status: Alert, oriented to person, place, and time and oriented to person, place and time.  Psychiatric:        Attention and Perception: Attention and perception normal.        Mood and Affect: Mood normal.        Behavior: Behavior is cooperative.        Thought Content: Thought content normal.        Assessment and Plan:  Coronary artery disease -She is s/p 2 overlapping DES to mLAD on 04/05/2023.She is stable with no anginal symptoms, no indication for ischemic evaluation at this time. Plan for Recommendations for DAPT with aspirin /Brilinta  for at least 1 year. Will clear her today to start cardiac rehab.  -Continue GDMT aspirin  81 mg daily, atorvastatin  80 mg daily, metoprolol  XL 25 mg daily, nitroglycerin  as needed, Brilinta  90mg  twice daily   Tobacco use -Stage of change: Action. She has smoked 1 ppd for several years and now smoking 5 cigarettes per day. She started Varenicline  x3 weeks ago and has been attempting to smoke less. Encouraged complete cessation.   Hyperlipidemia, LDL goal <70 -LDL 30 and lipoprotein (a) 224.3 on 03/2023. Her LDL is under excellent control. She was referred to PharmD during her previous visit for consideration of PSCK9i. Continue Atorvastatin  80mg  daily   Hypertension  -BP today 126/78 and under excellent control. Continue Metoprolol -XL 25mg  daily              Cardiac Rehabilitation Eligibility Assessment  The patient is ready to start cardiac rehabilitation from a cardiac standpoint.     Dispo:  Return in about 4 months (around  10/27/2023).  Signed, Lum LITTIE Louis, NP

## 2023-07-04 ENCOUNTER — Other Ambulatory Visit: Payer: Self-pay | Admitting: Nurse Practitioner

## 2023-07-04 DIAGNOSIS — Z1231 Encounter for screening mammogram for malignant neoplasm of breast: Secondary | ICD-10-CM

## 2023-07-05 ENCOUNTER — Telehealth (HOSPITAL_COMMUNITY): Payer: Self-pay

## 2023-07-05 ENCOUNTER — Ambulatory Visit: Payer: 59 | Admitting: Physician Assistant

## 2023-07-05 NOTE — Telephone Encounter (Signed)
 Pt returned CR phone call and stated she was interested in CR. Patient will come in for orientation on 07/10/23 @ 10:30AM and will attend the 10:15AM exercise class. Went over insurance, patient verbalized understanding.    Pt stated transportation is not issue right now, she will be taking the bus.

## 2023-07-05 NOTE — Telephone Encounter (Signed)
 Pt insurance is active and benefits verified through [facility] Dual [employer]. Co-pay $0.00, DED $257.00/$0.00 met, out of pocket $9,350.00/$0.00 met, co-insurance 20%. No pre-authorization required. Passport, 07/05/23 @ 11:21AM, REF#[id number]   How many CR sessions are covered? (36 visits for TCR, 72 visits for ICR)72 Is this a lifetime maximum or an annual maximum? Annual Has the member used any of these services to date? No Is there a time limit (weeks/months) on start of program and/or program completion? No

## 2023-07-05 NOTE — Telephone Encounter (Signed)
  Called pt to confirm appt for 07/10/23 at 1030. Gave pt instructions for appt, what to wear, office address, eating/taking meds before, and if sick to call and reschedule. Pt voiced understanding, all questions answered.   Health history completed? Yes   Con Dawn Pereyra, MS, ACSM-CEP 07/05/2023 3:04 PM

## 2023-07-10 ENCOUNTER — Encounter (HOSPITAL_COMMUNITY)
Admission: RE | Admit: 2023-07-10 | Discharge: 2023-07-10 | Disposition: A | Discharge status: Home / Self Care | Payer: 59 | Source: Ambulatory Visit | Attending: Cardiology | Admitting: Cardiology

## 2023-07-10 VITALS — BP 138/78 | HR 81 | Ht 63.5 in | Wt 195.5 lb

## 2023-07-10 DIAGNOSIS — Z955 Presence of coronary angioplasty implant and graft: Secondary | ICD-10-CM | POA: Insufficient documentation

## 2023-07-10 NOTE — Progress Notes (Signed)
 Cardiac Individual Treatment Plan  Patient Details  Name: [name] MRN: [id number] Date of Birth: Nov 11, 1970 Referring Provider:   Flowsheet Row INTENSIVE CARDIAC REHAB ORIENT from 07/10/2023 in [facility]  Referring Provider Anner Alm ORN, MD       Initial Encounter Date:  Flowsheet Row INTENSIVE CARDIAC REHAB ORIENT from 07/10/2023 in [facility] for Heart, V[facility], [facility] Lung [facility]  Date 07/10/23       Visit Diagnosis: 04/05/23 DES x 2 LAD  Patient's Home Medications on Admission:  Current Outpatient Medications:    acetaminophen  (TYLENOL ) 500 MG tablet, Take 1 tablet (500 mg total) by mouth in the morning, at noon, in the evening, and at bedtime., Disp: 28 tablet, Rfl: 0   ADVAIR HFA 115-21 MCG/ACT inhaler, Inhale 2 puffs into the lungs 2 (two) times daily., Disp: , Rfl:    albuterol  (VENTOLIN  HFA) 108 (90 Base) MCG/ACT inhaler, Inhale 1-2 puffs into the lungs every 6 (six) hours as needed for wheezing or shortness of breath., Disp: 18 g, Rfl: 0   aspirin  EC 81 MG tablet, Take 81 mg by mouth daily. Swallow whole., Disp: , Rfl:    atorvastatin  (LIPITOR) 80 MG tablet, Take 1 tablet (80 mg total) by mouth daily., Disp: 90 tablet, Rfl: 3   escitalopram  (LEXAPRO ) 10 MG tablet, Take 10 mg by mouth daily., Disp: , Rfl:    fluticasone  (FLONASE ) 50 MCG/ACT nasal spray, Place 1 spray into both nostrils daily., Disp: , Rfl:    gabapentin  (NEURONTIN ) 600 MG tablet, Take 600 mg by mouth 3 (three) times daily., Disp: , Rfl:    lidocaine  (LIDODERM ) 5 %, Place 1 patch onto the skin daily. Remove & Discard patch within 12 hours or as directed by MD, Disp: 10 patch, Rfl: 0   lurasidone  (LATUDA ) 40 MG TABS tablet, Take 40 mg by mouth every evening., Disp: , Rfl:    methocarbamol  (ROBAXIN ) 500 MG tablet, Take 1 tablet (500 mg total) by mouth 2 (two) times daily. (Patient taking differently: Take 500 mg by mouth  2 (two) times daily. As needed for sciatic nerve), Disp: 20 tablet, Rfl: 0   metoprolol  succinate (TOPROL -XL) 25 MG 24 hr tablet, Take 1 tablet (25 mg total) by mouth daily., Disp: 90 tablet, Rfl: 3   nitroGLYCERIN  (NITROSTAT ) 0.4 MG SL tablet, Place 1 tablet (0.4 mg total) under the tongue every 5 (five) minutes as needed., Disp: 25 tablet, Rfl: 3   omeprazole (PRILOSEC) 20 MG capsule, Take 20 mg by mouth daily., Disp: , Rfl:    ondansetron  (ZOFRAN ) 4 MG tablet, Take 1 tablet (4 mg total) by mouth every 6 (six) hours as needed for nausea or vomiting., Disp: 30 tablet, Rfl: 1   ticagrelor  (BRILINTA ) 90 MG TABS tablet, Take 1 tablet (90 mg total) by mouth 2 (two) times daily., Disp: 180 tablet, Rfl: 2   traZODone  (DESYREL ) 100 MG tablet, Take 100 mg by mouth at bedtime., Disp: , Rfl:    triamcinolone ointment (KENALOG) 0.1 %, Apply topically 2 (two) times daily., Disp: , Rfl:    trospium  (SANCTURA ) 20 MG tablet, TAKE 1 TABLET(20 MG) BY MOUTH TWICE DAILY, Disp: 60 tablet, Rfl: 2   Varenicline  Tartrate, Starter, 0.5 MG X 11 & 1 MG X 42 TBPK, Take 0.5 mg x 11 & 1x 42 po TBPK, Disp: 42 each, Rfl: 0   montelukast  (SINGULAIR ) 10 MG tablet, Take 10 mg by mouth daily. (  Patient not taking: Reported on 07/10/2023), Disp: , Rfl:    nicotine  (NICODERM CQ  - DOSED IN MG/24 HOURS) 14 mg/24hr patch, Place 1 patch (14 mg total) onto the skin daily as needed (nicotine  use). (Patient not taking: Reported on 07/10/2023), Disp: 28 patch, Rfl: 0   ondansetron  (ZOFRAN -ODT) 4 MG disintegrating tablet, 4mg  ODT q4 hours prn nausea/vomit, Disp: 12 tablet, Rfl: 0  Past Medical History: Past Medical History:  Diagnosis Date   Acid reflux    Anxiety    Arthritis    Asthma    Depression    Diabetes (HCC)    Drug abuse (HCC)    ETOH abuse    High cholesterol    Hypertension    Manic-depressive disorder (HCC)    PTSD (post-traumatic stress disorder)    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    Tobacco Use: Social History   Tobacco  Use  Smoking Status Every Day   Current packs/day: 0.50   Average packs/day: 0.5 packs/day for 29.0 years (14.5 ttl pk-yrs)   Types: Cigarettes  Smokeless Tobacco Never  Tobacco Comments   in rehab    Labs: Review Flowsheet  More data may exist      Latest Ref Rng & Units 09/02/2007 09/03/2007 05/04/2021 08/02/2021 04/05/2023  Labs for ITP Cardiac and Pulmonary Rehab  Cholestrol 0 - 200 mg/dL - 852        ATP III CLASSIFICATION:  <200     mg/dL   Desirable  799-760  mg/dL   Borderline High  >=759    mg/dL   High  810  806  891   LDL (calc) 0 - 99 mg/dL - 77        Total Cholesterol/HDL:CHD Risk Coronary Heart Disease Risk Table                     Men   Women  1/2 Average Risk   3.4   3.3  97  98  30   HDL-C >40 mg/dL - 50  67  69  55   Trlycerides <150 mg/dL - 99  876  871  884   Hemoglobin A1c 4.8 - 5.6 % 5.8 (NOTE)   The ADA recommends the following therapeutic goals for glycemic   control related to Hgb A1C measurement:   Goal of Therapy:   < 7.0% Hgb A1C   Action Suggested:  > 8.0% Hgb A1C   Ref:  Diabetes Care, 22, Suppl. 1, 1999  - 6.2  5.4  -    Capillary Blood Glucose: Lab Results  Component Value Date   GLUCAP 101 (H) 07/26/2011   GLUCAP 143 (H) 07/26/2011   GLUCAP 89 07/25/2011     Exercise Target Goals: Exercise Program Goal: Individual exercise prescription set using results from initial 6 min walk test and THRR while considering  patient's activity barriers and safety.   Exercise Prescription Goal: Initial exercise prescription builds to 30-45 minutes a day of aerobic activity, 2-3 days per week.  Home exercise guidelines will be given to patient during program as part of exercise prescription that the participant will acknowledge.  Activity Barriers & Risk Stratification:  Activity Barriers & Cardiac Risk Stratification - 07/10/23 1022       Activity Barriers & Cardiac Risk Stratification   Activity Barriers None    Cardiac Risk Stratification Moderate              6 Minute Walk:  6 Minute Walk  Row Name 07/10/23 1127         6 Minute Walk   Phase Initial     Distance 1560 feet     Walk Time 6 minutes     # of Rest Breaks 0     MPH 2.95     METS 2.93     RPE 11     Perceived Dyspnea  1     VO2 Peak 15.81     Symptoms Yes (comment)     Comments Mild shortness of breath.     Resting HR 81 bpm     Resting BP 138/78     Resting Oxygen Saturation  99 %     Exercise Oxygen Saturation  during 6 min walk 99 %     Max Ex. HR 138 bpm     Max Ex. BP 158/72     2 Minute Post BP 128/78              Oxygen Initial Assessment:   Oxygen Re-Evaluation:   Oxygen Discharge (Final Oxygen Re-Evaluation):   Initial Exercise Prescription:  Initial Exercise Prescription - 07/10/23 1200       Date of Initial Exercise RX and Referring Provider   Date 07/10/23    Referring Provider Anner Alm ORN, MD    Expected Discharge Date 10/26/23      Treadmill   MPH 1.7    Grade 0    Minutes 15    METs 2.3      Recumbant Elliptical   Level 1    RPM 1    Watts 25    Minutes 15    METs 2.9      Prescription Details   Frequency (times per week) 3    Duration Progress to 30 minutes of continuous aerobic without signs/symptoms of physical distress      Intensity   THRR 40-80% of Max Heartrate 60-120    Ratings of Perceived Exertion 11-13    Perceived Dyspnea 0-4      Progression   Progression Continue to progress workloads to maintain intensity without signs/symptoms of physical distress.      Resistance Training   Training Prescription Yes    Weight 3 lbs    Reps 10-15             Perform Capillary Blood Glucose checks as needed.  Exercise Prescription Changes:   Exercise Comments:   Exercise Goals and Review:   Exercise Goals     Row Name 07/10/23 1022             Exercise Goals   Increase Physical Activity Yes       Intervention Provide advice, education, support and counseling about  physical activity/exercise needs.;Develop an individualized exercise prescription for aerobic and resistive training based on initial evaluation findings, risk stratification, comorbidities and participant's personal goals.       Expected Outcomes Short Term: Attend rehab on a regular basis to increase amount of physical activity.;Long Term: Add in home exercise to make exercise part of routine and to increase amount of physical activity.;Long Term: Exercising regularly at least 3-5 days a week.       Increase Strength and Stamina Yes       Intervention Provide advice, education, support and counseling about physical activity/exercise needs.;Develop an individualized exercise prescription for aerobic and resistive training based on initial evaluation findings, risk stratification, comorbidities and participant's personal goals.       Expected Outcomes Short Term:  Increase workloads from initial exercise prescription for resistance, speed, and METs.;Short Term: Perform resistance training exercises routinely during rehab and add in resistance training at home;Long Term: Improve cardiorespiratory fitness, muscular endurance and strength as measured by increased METs and functional capacity ( )       Able to understand and use rate of perceived exertion (RPE) scale Yes       Intervention Provide education and explanation on how to use RPE scale       Expected Outcomes Short Term: Able to use RPE daily in rehab to express subjective intensity level;Long Term:  Able to use RPE to guide intensity level when exercising independently       Knowledge and understanding of Target Heart Rate Range (THRR) Yes       Intervention Provide education and explanation of THRR including how the numbers were predicted and where they are located for reference       Expected Outcomes Short Term: Able to state/look up THRR;Long Term: Able to use THRR to govern intensity when exercising independently;Short Term: Able to use  daily as guideline for intensity in rehab       Able to check pulse independently Yes       Intervention Provide education and demonstration on how to check pulse in carotid and radial arteries.;Review the importance of being able to check your own pulse for safety during independent exercise       Expected Outcomes Short Term: Able to explain why pulse checking is important during independent exercise;Long Term: Able to check pulse independently and accurately       Understanding of Exercise Prescription Yes       Intervention Provide education, explanation, and written materials on patient's individual exercise prescription       Expected Outcomes Short Term: Able to explain program exercise prescription;Long Term: Able to explain home exercise prescription to exercise independently                Exercise Goals Re-Evaluation :   Discharge Exercise Prescription (Final Exercise Prescription Changes):   Nutrition:  Target Goals: Understanding of nutrition guidelines, daily intake of sodium 1500mg , cholesterol 200mg , calories 30% from fat and 7% or less from saturated fats, daily to have 5 or more servings of fruits and vegetables.  Biometrics:  Pre Biometrics - 07/10/23 1022       Pre Biometrics   Height 5' 3.5 (1.613 m)    Waist Circumference 43.5 inches    Hip Circumference 44.75 inches    Waist to Hip Ratio 0.97 %    BMI (Calculated) 34.09    Triceps Skinfold 28 mm    % Body Fat 44.4 %    Grip Strength 32 kg    Flexibility 17.25 in    Single Leg Stand 5.97 seconds              Nutrition Therapy Plan and Nutrition Goals:   Nutrition Assessments:  MEDIFICTS Score Key: >=70 Need to make dietary changes  40-70 Heart Healthy Diet <= 40 Therapeutic Level Cholesterol Diet    Picture Your Plate Scores: <59 Unhealthy dietary pattern with much room for improvement. 41-50 Dietary pattern unlikely to meet recommendations for good health and room for  improvement. 51-60 More healthful dietary pattern, with some room for improvement.  >60 Healthy dietary pattern, although there may be some specific behaviors that could be improved.    Nutrition Goals Re-Evaluation:   Nutrition Goals Re-Evaluation:   Nutrition Goals Discharge (Final Nutrition Goals  Re-Evaluation):   Psychosocial: Target Goals: Acknowledge presence or absence of significant depression and/or stress, maximize coping skills, provide positive support system. Participant is able to verbalize types and ability to use techniques and skills needed for reducing stress and depression.  Initial Review & Psychosocial Screening:  Initial Psych Review & Screening - 07/10/23 1058       Initial Review   Current issues with History of Depression;Current Stress Concerns    Source of Stress Concerns Financial;Transportation    Comments Fredrika currently lives with her sister but is in search of other living arrangements.      Family Dynamics   Good Support System? Yes    Comments She has good support from her daughter. She also has a sponsor and a friend that she can talk to. She is intersted in talking to a veterinary surgeon.      Barriers   Psychosocial barriers to participate in program Psychosocial barriers identified (see note);The patient should benefit from training in stress management and relaxation.      Screening Interventions   Interventions Encouraged to exercise;To provide support and resources with identified psychosocial needs;Provide feedback about the scores to participant    Expected Outcomes Short Term goal: Utilizing psychosocial counselor, staff and physician to assist with identification of specific Stressors or current issues interfering with healing process. Setting desired goal for each stressor or current issue identified.;Long Term Goal: Stressors or current issues are controlled or eliminated.;Short Term goal: Identification and review with participant of any  Quality of Life or Depression concerns found by scoring the questionnaire.;Long Term goal: The participant improves quality of Life and PHQ9 Scores as seen by post scores and/or verbalization of changes             Quality of Life Scores:  Quality of Life - 07/10/23 1222       Quality of Life   Select Quality of Life      Quality of Life Scores   Health/Function Pre 22.7 %    Socioeconomic Pre 23.79 %    Psych/Spiritual Pre 23.14 %    Family Pre 25.2 %    GLOBAL Pre 23.38 %            Scores of 19 and below usually indicate a poorer quality of life in these areas.  A difference of  2-3 points is a clinically meaningful difference.  A difference of 2-3 points in the total score of the Quality of Life Index has been associated with significant improvement in overall quality of life, self-image, physical symptoms, and general health in studies assessing change in quality of life.  PHQ-9: Review Flowsheet       07/10/2023 12/21/2022  Depression screen PHQ 2/9  Decreased Interest 1 1  Down, Depressed, Hopeless 0 1  PHQ - 2 Score 1 2  Altered sleeping 1 3  Tired, decreased energy 1 1  Change in appetite 2 1  Feeling bad or failure about yourself  1 0  Trouble concentrating 0 1  Moving slowly or fidgety/restless 0 0  Suicidal thoughts 0 0  PHQ-9 Score 6 8  Difficult doing work/chores Not difficult at all -   Interpretation of Total Score  Total Score Depression Severity:  1-4 = Minimal depression, 5-9 = Mild depression, 10-14 = Moderate depression, 15-19 = Moderately severe depression, 20-27 = Severe depression   Psychosocial Evaluation and Intervention:   Psychosocial Re-Evaluation:   Psychosocial Discharge (Final Psychosocial Re-Evaluation):   Vocational Rehabilitation: Provide vocational rehab  assistance to qualifying candidates.   Vocational Rehab Evaluation & Intervention:  Vocational Rehab - 07/10/23 1100       Initial Vocational Rehab Evaluation &  Intervention   Assessment shows need for Vocational Rehabilitation Yes    Vocational Rehab Packet given to patient 07/10/23      Vocational Rehab Re-Evaulation   Comments Chassie is interested in vocational rehab. She is currently taking classes trying to get her Associates Degree.             Education: Education Goals: Education classes will be provided on a weekly basis, covering required topics. Participant will state understanding/return demonstration of topics presented.     Core Videos: Exercise    Move It!  Clinical staff conducted group or individual video education with verbal and written material and guidebook.  Patient learns the recommended Pritikin exercise program. Exercise with the goal of living a long, healthy life. Some of the health benefits of exercise include controlled diabetes, healthier blood pressure levels, improved cholesterol levels, improved heart and lung capacity, improved sleep, and better body composition. Everyone should speak with their doctor before starting or changing an exercise routine.  Biomechanical Limitations Clinical staff conducted group or individual video education with verbal and written material and guidebook.  Patient learns how biomechanical limitations can impact exercise and how we can mitigate and possibly overcome limitations to have an impactful and balanced exercise routine.  Body Composition Clinical staff conducted group or individual video education with verbal and written material and guidebook.  Patient learns that body composition (ratio of muscle mass to fat mass) is a key component to assessing overall fitness, rather than body weight alone. Increased fat mass, especially visceral belly fat, can put us  at increased risk for metabolic syndrome, type 2 diabetes, heart disease, and even death. It is recommended to combine diet and exercise (cardiovascular and resistance training) to improve your body composition. Seek  guidance from your physician and exercise physiologist before implementing an exercise routine.  Exercise Action Plan Clinical staff conducted group or individual video education with verbal and written material and guidebook.  Patient learns the recommended strategies to achieve and enjoy long-term exercise adherence, including variety, self-motivation, self-efficacy, and positive decision making. Benefits of exercise include fitness, good health, weight management, more energy, better sleep, less stress, and overall well-being.  Medical   Heart Disease Risk Reduction Clinical staff conducted group or individual video education with verbal and written material and guidebook.  Patient learns our heart is our most vital organ as it circulates oxygen, nutrients, white blood cells, and hormones throughout the entire body, and carries waste away. Data supports a plant-based eating plan like the Pritikin Program for its effectiveness in slowing progression of and reversing heart disease. The video provides a number of recommendations to address heart disease.   Metabolic Syndrome and Belly Fat  Clinical staff conducted group or individual video education with verbal and written material and guidebook.  Patient learns what metabolic syndrome is, how it leads to heart disease, and how one can reverse it and keep it from coming back. You have metabolic syndrome if you have 3 of the following 5 criteria: abdominal obesity, high blood pressure, high triglycerides, low HDL cholesterol, and high blood sugar.  Hypertension and Heart Disease Clinical staff conducted group or individual video education with verbal and written material and guidebook.  Patient learns that high blood pressure, or hypertension, is very common in the [place] . Hypertension is largely due to excessive  salt intake, but other important risk factors include being overweight, physical inactivity, drinking too much alcohol, smoking, and  not eating enough potassium from fruits and vegetables. High blood pressure is a leading risk factor for heart attack, stroke, congestive heart failure, dementia, kidney failure, and premature death. Long-term effects of excessive salt intake include stiffening of the arteries and thickening of heart muscle and organ damage. Recommendations include ways to reduce hypertension and the risk of heart disease.  Diseases of Our Time - Focusing on Diabetes Clinical staff conducted group or individual video education with verbal and written material and guidebook.  Patient learns why the best way to stop diseases of our time is prevention, through food and other lifestyle changes. Medicine (such as prescription pills and surgeries) is often only a Band-Aid on the problem, not a long-term solution. Most common diseases of our time include obesity, type 2 diabetes, hypertension, heart disease, and cancer. The Pritikin Program is recommended and has been proven to help reduce, reverse, and/or prevent the damaging effects of metabolic syndrome.  Nutrition   Overview of the Pritikin Eating Plan  Clinical staff conducted group or individual video education with verbal and written material and guidebook.  Patient learns about the Pritikin Eating Plan for disease risk reduction. The Pritikin Eating Plan emphasizes a wide variety of unrefined, minimally-processed carbohydrates, like fruits, vegetables, whole grains, and legumes. Go, Caution, and Stop food choices are explained. Plant-based and lean animal proteins are emphasized. Rationale provided for low sodium intake for blood pressure control, low added sugars for blood sugar stabilization, and low added fats and oils for coronary artery disease risk reduction and weight management.  Calorie Density  Clinical staff conducted group or individual video education with verbal and written material and guidebook.  Patient learns about calorie density and how it impacts  the Pritikin Eating Plan. Knowing the characteristics of the food you choose will help you decide whether those foods will lead to weight gain or weight loss, and whether you want to consume more or less of them. Weight loss is usually a side effect of the Pritikin Eating Plan because of its focus on low calorie-dense foods.  Label Reading  Clinical staff conducted group or individual video education with verbal and written material and guidebook.  Patient learns about the Pritikin recommended label reading guidelines and corresponding recommendations regarding calorie density, added sugars, sodium content, and whole grains.  Dining Out - Part 1  Clinical staff conducted group or individual video education with verbal and written material and guidebook.  Patient learns that restaurant meals can be sabotaging because they can be so high in calories, fat, sodium, and/or sugar. Patient learns recommended strategies on how to positively address this and avoid unhealthy pitfalls.  Facts on Fats  Clinical staff conducted group or individual video education with verbal and written material and guidebook.  Patient learns that lifestyle modifications can be just as effective, if not more so, as many medications for lowering your risk of heart disease. A Pritikin lifestyle can help to reduce your risk of inflammation and atherosclerosis (cholesterol build-up, or plaque, in the artery walls). Lifestyle interventions such as dietary choices and physical activity address the cause of atherosclerosis. A review of the types of fats and their impact on blood cholesterol levels, along with dietary recommendations to reduce fat intake is also included.  Nutrition Action Plan  Clinical staff conducted group or individual video education with verbal and written material and guidebook.  Patient learns how  to incorporate Pritikin recommendations into their lifestyle. Recommendations include planning and keeping personal  health goals in mind as an important part of their success.  Healthy Mind-Set    Healthy Minds, Bodies, Hearts  Clinical staff conducted group or individual video education with verbal and written material and guidebook.  Patient learns how to identify when they are stressed. Video will discuss the impact of that stress, as well as the many benefits of stress management. Patient will also be introduced to stress management techniques. The way we think, act, and feel has an impact on our hearts.  How Our Thoughts Can Heal Our Hearts  Clinical staff conducted group or individual video education with verbal and written material and guidebook.  Patient learns that negative thoughts can cause depression and anxiety. This can result in negative lifestyle behavior and serious health problems. Cognitive behavioral therapy is an effective method to help control our thoughts in order to change and improve our emotional outlook.  Additional Videos:  Exercise    Improving Performance  Clinical staff conducted group or individual video education with verbal and written material and guidebook.  Patient learns to use a non-linear approach by alternating intensity levels and lengths of time spent exercising to help burn more calories and lose more body fat. Cardiovascular exercise helps improve heart health, metabolism, hormonal balance, blood sugar control, and recovery from fatigue. Resistance training improves strength, endurance, balance, coordination, reaction time, metabolism, and muscle mass. Flexibility exercise improves circulation, posture, and balance. Seek guidance from your physician and exercise physiologist before implementing an exercise routine and learn your capabilities and proper form for all exercise.  Introduction to Yoga  Clinical staff conducted group or individual video education with verbal and written material and guidebook.  Patient learns about yoga, a discipline of the coming  together of mind, breath, and body. The benefits of yoga include improved flexibility, improved range of motion, better posture and core strength, increased lung function, weight loss, and positive self-image. Yoga's heart health benefits include lowered blood pressure, healthier heart rate, decreased cholesterol and triglyceride levels, improved immune function, and reduced stress. Seek guidance from your physician and exercise physiologist before implementing an exercise routine and learn your capabilities and proper form for all exercise.  Medical   Aging: Enhancing Your Quality of Life  Clinical staff conducted group or individual video education with verbal and written material and guidebook.  Patient learns key strategies and recommendations to stay in good physical health and enhance quality of life, such as prevention strategies, having an advocate, securing a Health Care Proxy and Power of Attorney, and keeping a list of medications and system for tracking them. It also discusses how to avoid risk for bone loss.  Biology of Weight Control  Clinical staff conducted group or individual video education with verbal and written material and guidebook.  Patient learns that weight gain occurs because we consume more calories than we burn (eating more, moving less). Even if your body weight is normal, you may have higher ratios of fat compared to muscle mass. Too much body fat puts you at increased risk for cardiovascular disease, heart attack, stroke, type 2 diabetes, and obesity-related cancers. In addition to exercise, following the Pritikin Eating Plan can help reduce your risk.  Decoding Lab Results  Clinical staff conducted group or individual video education with verbal and written material and guidebook.  Patient learns that lab test reflects one measurement whose values change over time and are influenced by many factors,  including medication, stress, sleep, exercise, food, hydration,  pre-existing medical conditions, and more. It is recommended to use the knowledge from this video to become more involved with your lab results and evaluate your numbers to speak with your doctor.   Diseases of Our Time - Overview  Clinical staff conducted group or individual video education with verbal and written material and guidebook.  Patient learns that according to the CDC, 50% to 70% of chronic diseases (such as obesity, type 2 diabetes, elevated lipids, hypertension, and heart disease) are avoidable through lifestyle improvements including healthier food choices, listening to satiety cues, and increased physical activity.  Sleep Disorders Clinical staff conducted group or individual video education with verbal and written material and guidebook.  Patient learns how good quality and duration of sleep are important to overall health and well-being. Patient also learns about sleep disorders and how they impact health along with recommendations to address them, including discussing with a physician.  Nutrition  Dining Out - Part 2 Clinical staff conducted group or individual video education with verbal and written material and guidebook.  Patient learns how to plan ahead and communicate in order to maximize their dining experience in a healthy and nutritious manner. Included are recommended food choices based on the type of restaurant the patient is visiting.   Fueling a Banker conducted group or individual video education with verbal and written material and guidebook.  There is a strong connection between our food choices and our health. Diseases like obesity and type 2 diabetes are very prevalent and are in large-part due to lifestyle choices. The Pritikin Eating Plan provides plenty of food and hunger-curbing satisfaction. It is easy to follow, affordable, and helps reduce health risks.  Menu Workshop  Clinical staff conducted group or individual video education  with verbal and written material and guidebook.  Patient learns that restaurant meals can sabotage health goals because they are often packed with calories, fat, sodium, and sugar. Recommendations include strategies to plan ahead and to communicate with the manager, chef, or server to help order a healthier meal.  Planning Your Eating Strategy  Clinical staff conducted group or individual video education with verbal and written material and guidebook.  Patient learns about the Pritikin Eating Plan and its benefit of reducing the risk of disease. The Pritikin Eating Plan does not focus on calories. Instead, it emphasizes high-quality, nutrient-rich foods. By knowing the characteristics of the foods, we choose, we can determine their calorie density and make informed decisions.  Targeting Your Nutrition Priorities  Clinical staff conducted group or individual video education with verbal and written material and guidebook.  Patient learns that lifestyle habits have a tremendous impact on disease risk and progression. This video provides eating and physical activity recommendations based on your personal health goals, such as reducing LDL cholesterol, losing weight, preventing or controlling type 2 diabetes, and reducing high blood pressure.  Vitamins and Minerals  Clinical staff conducted group or individual video education with verbal and written material and guidebook.  Patient learns different ways to obtain key vitamins and minerals, including through a recommended healthy diet. It is important to discuss all supplements you take with your doctor.   Healthy Mind-Set    Smoking Cessation  Clinical staff conducted group or individual video education with verbal and written material and guidebook.  Patient learns that cigarette smoking and tobacco addiction pose a serious health risk which affects millions of people. Stopping smoking will significantly reduce the  risk of heart disease, lung disease,  and many forms of cancer. Recommended strategies for quitting are covered, including working with your doctor to develop a successful plan.  Culinary   Becoming a Set Designer conducted group or individual video education with verbal and written material and guidebook.  Patient learns that cooking at home can be healthy, cost-effective, quick, and puts them in control. Keys to cooking healthy recipes will include looking at your recipe, assessing your equipment needs, planning ahead, making it simple, choosing cost-effective seasonal ingredients, and limiting the use of added fats, salts, and sugars.  Cooking - Breakfast and Snacks  Clinical staff conducted group or individual video education with verbal and written material and guidebook.  Patient learns how important breakfast is to satiety and nutrition through the entire day. Recommendations include key foods to eat during breakfast to help stabilize blood sugar levels and to prevent overeating at meals later in the day. Planning ahead is also a key component.  Cooking - Educational Psychologist conducted group or individual video education with verbal and written material and guidebook.  Patient learns eating strategies to improve overall health, including an approach to cook more at home. Recommendations include thinking of animal protein as a side on your plate rather than center stage and focusing instead on lower calorie dense options like vegetables, fruits, whole grains, and plant-based proteins, such as beans. Making sauces in large quantities to freeze for later and leaving the skin on your vegetables are also recommended to maximize your experience.  Cooking - Healthy Salads and Dressing Clinical staff conducted group or individual video education with verbal and written material and guidebook.  Patient learns that vegetables, fruits, whole grains, and legumes are the foundations of the Pritikin Eating Plan.  Recommendations include how to incorporate each of these in flavorful and healthy salads, and how to create homemade salad dressings. Proper handling of ingredients is also covered. Cooking - Soups and [employer] - Soups and Desserts Clinical staff conducted group or individual video education with verbal and written material and guidebook.  Patient learns that Pritikin soups and desserts make for easy, nutritious, and delicious snacks and meal components that are low in sodium, fat, sugar, and calorie density, while high in vitamins, minerals, and filling fiber. Recommendations include simple and healthy ideas for soups and desserts.   Overview     The Pritikin Solution Program Overview Clinical staff conducted group or individual video education with verbal and written material and guidebook.  Patient learns that the results of the Pritikin Program have been documented in more than 100 articles published in peer-reviewed journals, and the benefits include reducing risk factors for (and, in some cases, even reversing) high cholesterol, high blood pressure, type 2 diabetes, obesity, and more! An overview of the three key pillars of the Pritikin Program will be covered: eating well, doing regular exercise, and having a healthy mind-set.  WORKSHOPS  Exercise: Exercise Basics: Building Your Action Plan Clinical staff led group instruction and group discussion with PowerPoint presentation and patient guidebook. To enhance the learning environment the use of posters, models and videos may be added. At the conclusion of this workshop, patients will comprehend the difference between physical activity and exercise, as well as the benefits of incorporating both, into their routine. Patients will understand the FITT (Frequency, Intensity, Time, and Type) principle and how to use it to build an exercise action plan. In addition, safety concerns and other  considerations for exercise and cardiac rehab  will be addressed by the presenter. The purpose of this lesson is to promote a comprehensive and effective weekly exercise routine in order to improve patients' overall level of fitness.   Managing Heart Disease: Your Path to a Healthier Heart Clinical staff led group instruction and group discussion with PowerPoint presentation and patient guidebook. To enhance the learning environment the use of posters, models and videos may be added.At the conclusion of this workshop, patients will understand the anatomy and physiology of the heart. Additionally, they will understand how Pritikin's three pillars impact the risk factors, the progression, and the management of heart disease.  The purpose of this lesson is to provide a high-level overview of the heart, heart disease, and how the Pritikin lifestyle positively impacts risk factors.  Exercise Biomechanics Clinical staff led group instruction and group discussion with PowerPoint presentation and patient guidebook. To enhance the learning environment the use of posters, models and videos may be added. Patients will learn how the structural parts of their bodies function and how these functions impact their daily activities, movement, and exercise. Patients will learn how to promote a neutral spine, learn how to manage pain, and identify ways to improve their physical movement in order to promote healthy living. The purpose of this lesson is to expose patients to common physical limitations that impact physical activity. Participants will learn practical ways to adapt and manage aches and pains, and to minimize their effect on regular exercise. Patients will learn how to maintain good posture while sitting, walking, and lifting.  Balance Training and Fall Prevention  Clinical staff led group instruction and group discussion with PowerPoint presentation and patient guidebook. To enhance the learning environment the use of posters, models and videos  may be added. At the conclusion of this workshop, patients will understand the importance of their sensorimotor skills (vision, proprioception, and the vestibular system) in maintaining their ability to balance as they age. Patients will apply a variety of balancing exercises that are appropriate for their current level of function. Patients will understand the common causes for poor balance, possible solutions to these problems, and ways to modify their physical environment in order to minimize their fall risk. The purpose of this lesson is to teach patients about the importance of maintaining balance as they age and ways to minimize their risk of falling.  WORKSHOPS   Nutrition:  Fueling a Ship Broker led group instruction and group discussion with PowerPoint presentation and patient guidebook. To enhance the learning environment the use of posters, models and videos may be added. Patients will review the foundational principles of the Pritikin Eating Plan and understand what constitutes a serving size in each of the food groups. Patients will also learn Pritikin-friendly foods that are better choices when away from home and review make-ahead meal and snack options. Calorie density will be reviewed and applied to three nutrition priorities: weight maintenance, weight loss, and weight gain. The purpose of this lesson is to reinforce (in a group setting) the key concepts around what patients are recommended to eat and how to apply these guidelines when away from home by planning and selecting Pritikin-friendly options. Patients will understand how calorie density may be adjusted for different weight management goals.  Mindful Eating  Clinical staff led group instruction and group discussion with PowerPoint presentation and patient guidebook. To enhance the learning environment the use of posters, models and videos may be added. Patients will briefly review the  concepts of the Pritikin  Eating Plan and the importance of low-calorie dense foods. The concept of mindful eating will be introduced as well as the importance of paying attention to internal hunger signals. Triggers for non-hunger eating and techniques for dealing with triggers will be explored. The purpose of this lesson is to provide patients with the opportunity to review the basic principles of the Pritikin Eating Plan, discuss the value of eating mindfully and how to measure internal cues of hunger and fullness using the Hunger Scale. Patients will also discuss reasons for non-hunger eating and learn strategies to use for controlling emotional eating.  Targeting Your Nutrition Priorities Clinical staff led group instruction and group discussion with PowerPoint presentation and patient guidebook. To enhance the learning environment the use of posters, models and videos may be added. Patients will learn how to determine their genetic susceptibility to disease by reviewing their family history. Patients will gain insight into the importance of diet as part of an overall healthy lifestyle in mitigating the impact of genetics and other environmental insults. The purpose of this lesson is to provide patients with the opportunity to assess their personal nutrition priorities by looking at their family history, their own health history and current risk factors. Patients will also be able to discuss ways of prioritizing and modifying the Pritikin Eating Plan for their highest risk areas  Menu  Clinical staff led group instruction and group discussion with PowerPoint presentation and patient guidebook. To enhance the learning environment the use of posters, models and videos may be added. Using menus brought in from [employer], or printed from [employer], patients will apply the Pritikin dining out guidelines that were presented in the [employer] video. Patients will also be able to practice these guidelines  in a variety of provided scenarios. The purpose of this lesson is to provide patients with the opportunity to practice hands-on learning of the Pritikin Dining Out guidelines with actual menus and practice scenarios.  Label Reading Clinical staff led group instruction and group discussion with PowerPoint presentation and patient guidebook. To enhance the learning environment the use of posters, models and videos may be added. Patients will review and discuss the Pritikin label reading guidelines presented in Pritikin's Label Reading Educational series video. Using fool labels brought in from local grocery stores and markets, patients will apply the label reading guidelines and determine if the packaged food meet the Pritikin guidelines. The purpose of this lesson is to provide patients with the opportunity to review, discuss, and practice hands-on learning of the Pritikin Label Reading guidelines with actual packaged food labels. Cooking School  Pritikin's [employer] are designed to teach patients ways to prepare quick, simple, and affordable recipes at home. The importance of nutrition's role in chronic disease risk reduction is reflected in its emphasis in the overall Pritikin program. By learning how to prepare essential core Pritikin Eating Plan recipes, patients will increase control over what they eat; be able to customize the flavor of foods without the use of added salt, sugar, or fat; and improve the quality of the food they consume. By learning a set of core recipes which are easily assembled, quickly prepared, and affordable, patients are more likely to prepare more healthy foods at home. These workshops focus on convenient breakfasts, simple entres, side dishes, and desserts which can be prepared with minimal effort and are consistent with nutrition recommendations for cardiovascular risk reduction. Cooking [employer] are taught by a investment banker, operational or  registered dietitian (RD) who has  been trained by the [employer] team. The chef or RD has a clear understanding of the importance of minimizing - if not completely eliminating - added fat, sugar, and sodium in recipes. Throughout the series of Cooking School Workshop sessions, patients will learn about healthy ingredients and efficient methods of cooking to build confidence in their capability to prepare    Cooking School weekly topics:  Adding Flavor- Sodium-Free  Fast and Healthy Breakfasts  Powerhouse Plant-Based Proteins  Satisfying Salads and Dressings  Simple Sides and Sauces  International Cuisine-Spotlight on the [employer] Zones  Delicious Desserts  Savory Soups  [employer] - Meals in a Astronomer Appetizers and Snacks  Comforting Weekend Breakfasts  One-Pot Wonders   Fast Evening Meals  Landscape Architect Your Pritikin Plate  WORKSHOPS   Healthy Mindset (Psychosocial):  Focused Goals, Sustainable Changes Clinical staff led group instruction and group discussion with PowerPoint presentation and patient guidebook. To enhance the learning environment the use of posters, models and videos may be added. Patients will be able to apply effective goal setting strategies to establish at least one personal goal, and then take consistent, meaningful action toward that goal. They will learn to identify common barriers to achieving personal goals and develop strategies to overcome them. Patients will also gain an understanding of how our mind-set can impact our ability to achieve goals and the importance of cultivating a positive and growth-oriented mind-set. The purpose of this lesson is to provide patients with a deeper understanding of how to set and achieve personal goals, as well as the tools and strategies needed to overcome common obstacles which may arise along the way.  From Head to Heart: The Power of a Healthy Outlook  Clinical staff led group instruction and group discussion with  PowerPoint presentation and patient guidebook. To enhance the learning environment the use of posters, models and videos may be added. Patients will be able to recognize and describe the impact of emotions and mood on physical health. They will discover the importance of self-care and explore self-care practices which may work for them. Patients will also learn how to utilize the 4 C's to cultivate a healthier outlook and better manage stress and challenges. The purpose of this lesson is to demonstrate to patients how a healthy outlook is an essential part of maintaining good health, especially as they continue their cardiac rehab journey.  Healthy Sleep for a Healthy Heart Clinical staff led group instruction and group discussion with PowerPoint presentation and patient guidebook. To enhance the learning environment the use of posters, models and videos may be added. At the conclusion of this workshop, patients will be able to demonstrate knowledge of the importance of sleep to overall health, well-being, and quality of life. They will understand the symptoms of, and treatments for, common sleep disorders. Patients will also be able to identify daytime and nighttime behaviors which impact sleep, and they will be able to apply these tools to help manage sleep-related challenges. The purpose of this lesson is to provide patients with a general overview of sleep and outline the importance of quality sleep. Patients will learn about a few of the most common sleep disorders. Patients will also be introduced to the concept of "sleep hygiene," and discover ways to self-manage certain sleeping problems through simple daily behavior changes. Finally, the workshop will motivate patients by clarifying the links between quality sleep and their goals of heart-healthy living.  Recognizing and Reducing Stress Clinical staff led group instruction and group discussion with PowerPoint presentation and patient guidebook. To  enhance the learning environment the use of posters, models and videos may be added. At the conclusion of this workshop, patients will be able to understand the types of stress reactions, differentiate between acute and chronic stress, and recognize the impact that chronic stress has on their health. They will also be able to apply different coping mechanisms, such as reframing negative self-talk. Patients will have the opportunity to practice a variety of stress management techniques, such as deep abdominal breathing, progressive muscle relaxation, and/or guided imagery.  The purpose of this lesson is to educate patients on the role of stress in their lives and to provide healthy techniques for coping with it.  Learning Barriers/Preferences:  Learning Barriers/Preferences - 07/10/23 1059       Learning Barriers/Preferences   Learning Barriers Hearing   Wears hearing aids   Learning Preferences Group Instruction;Individual Instruction;Skilled Demonstration             Education Topics:  Knowledge Questionnaire Score:  Knowledge Questionnaire Score - 07/10/23 1223       Knowledge Questionnaire Score   Pre Score 24/28             Core Components/Risk Factors/Patient Goals at Admission:  Personal Goals and Risk Factors at Admission - 07/10/23 1022       Core Components/Risk Factors/Patient Goals on Admission    Weight Management Yes;Obesity;Weight Loss    Intervention Weight Management/Obesity: Establish reasonable short term and long term weight goals.;Obesity: Provide education and appropriate resources to help participant work on and attain dietary goals.    Admit Weight 195 lb 8.8 oz (88.7 kg)    Expected Outcomes Short Term: Continue to assess and modify interventions until short term weight is achieved;Long Term: Adherence to nutrition and physical activity/exercise program aimed toward attainment of established weight goal;Weight Loss: Understanding of general  recommendations for a balanced deficit meal plan, which promotes 1-2 lb weight loss per week and includes a negative energy balance of [phone] kcal/d    Tobacco Cessation Yes    Number of packs per day 0.25    Intervention Offer self-teaching materials, assist with locating and accessing local/national Quit Smoking programs, and support quit date choice.;Assist the participant in steps to quit. Provide individualized education and counseling about committing to Tobacco Cessation, relapse prevention, and pharmacological support that can be provided by physician.    Expected Outcomes Short Term: Will demonstrate readiness to quit, by selecting a quit date.;Long Term: Complete abstinence from all tobacco products for at least 12 months from quit date.;Short Term: Will quit all tobacco product use, adhering to prevention of relapse plan.    Hypertension Yes    Intervention Provide education on lifestyle modifcations including regular physical activity/exercise, weight management, moderate sodium restriction and increased consumption of fresh fruit, vegetables, and low fat dairy, alcohol moderation, and smoking cessation.;Monitor prescription use compliance.    Expected Outcomes Short Term: Continued assessment and intervention until BP is < 140/27mm HG in hypertensive participants. < 130/62mm HG in hypertensive participants with diabetes, heart failure or chronic kidney disease.;Long Term: Maintenance of blood pressure at goal levels.    Lipids Yes    Intervention Provide education and support for participant on nutrition & aerobic/resistive exercise along with prescribed medications to achieve LDL 70mg , HDL >40mg .    Expected Outcomes Short Term: Participant states understanding of desired cholesterol values and is compliant with medications  prescribed. Participant is following exercise prescription and nutrition guidelines.;Long Term: Cholesterol controlled with medications as prescribed, with  individualized exercise RX and with personalized nutrition plan. Value goals: LDL < 70mg , HDL > 40 mg.    Stress Yes    Intervention Offer individual and/or small group education and counseling on adjustment to heart disease, stress management and health-related lifestyle change. Teach and support self-help strategies.;Refer participants experiencing significant psychosocial distress to appropriate mental health specialists for further evaluation and treatment. When possible, include family members and significant others in education/counseling sessions.    Expected Outcomes Short Term: Participant demonstrates changes in health-related behavior, relaxation and other stress management skills, ability to obtain effective social support, and compliance with psychotropic medications if prescribed.;Long Term: Emotional wellbeing is indicated by absence of clinically significant psychosocial distress or social isolation.             Core Components/Risk Factors/Patient Goals Review:    Core Components/Risk Factors/Patient Goals at Discharge (Final Review):    ITP Comments:  ITP Comments     Row Name 07/10/23 1022           ITP Comments Medical Director- Dr. Wilbert Bihari, MD. Introduction to the [facility]/  Intensive Cardiac Rehab Program. Reviewed initial orientation folder.                Comments: Laketa attended orientation for the cardiac rehabilitation program on  07/10/2023  to perform initial intake and exercise walk test. She was introduced to the [employer] education and orientation packet was reviewed. Completed 6-minute walk test, measurements, initial ITP, and exercise prescription. Vital signs stable. Telemetry-normal sinus rhythm, with rare PVCs asymptomatic.   Service time was from 1007 to 1153.  Arnoldo CHRISTELLA Gal, MS, ACSM CEP

## 2023-07-10 NOTE — Progress Notes (Addendum)
 PSYCHOSOCIAL ASSESSMENT   Patient psychosocial assessment reveals patient shows poor coping skills with  negative outlook.  Financial barriers to rehab participation identified. [name] is interested in talking to a counselor about her anxiety and depression. She has stress related to her current living situation. She lives with her sister, but she's trying to find housing. Offered emotional support and reassurance. Agreeable for ambulatory referral to [facility] with verbalizing understanding this is a separate cost from Cardiac/Pulmonary rehab and there may be a financial obligation depending upon their insurance plan coverage.  PHQ9 -6  Quality of life Score - Overall 23.38 Health and Function 22.7 Socioeconomic 23.79 Psychological and Spiritual 23.14 Family 25.2   Arnoldo CHRISTELLA Gal, MS, ACSM CEP

## 2023-07-10 NOTE — Progress Notes (Signed)
 Cardiac Rehab Medication Review   Does the patient  feel that his/her medications are working for him/her?  Somewhat, she feels that the medications that she takes for mental health (Latuda , Lexapro ) could be working better for her.  Has the patient been experiencing any side effects to the medications prescribed?  Yes, shortness of breath from Brilinta . Instructed by physician to drink caffeine to help with the shortness of breath.  Does the patient measure his/her own blood pressure or blood glucose at home?  She previously checked her blood pressure but her blood pressure cuff is not working at this time.   Does the patient have any problems obtaining medications due to transportation or finances?   Sometimes due to transportaion.  Understanding of regimen: good Understanding of indications: excellent Potential of compliance: excellent    Comments: Tyasia is no longer taking Singulair  but Claritin  instead. She will bring the dosage when she returns.    Arnoldo Gal 07/10/2023 10:36 AM

## 2023-07-16 ENCOUNTER — Encounter (HOSPITAL_COMMUNITY)
Admission: RE | Admit: 2023-07-16 | Discharge: 2023-07-16 | Disposition: A | Discharge status: Home / Self Care | Payer: 59 | Source: Ambulatory Visit | Attending: Cardiology | Admitting: Cardiology

## 2023-07-16 DIAGNOSIS — Z955 Presence of coronary angioplasty implant and graft: Secondary | ICD-10-CM | POA: Diagnosis not present

## 2023-07-16 NOTE — Progress Notes (Signed)
Daily Session Note  Patient Details  Name: [name] MRN: [id number] Date of Birth: 04/05/1971 Referring Provider:   Flowsheet Row INTENSIVE CARDIAC REHAB ORIENT from 07/10/2023 in [facility]  Referring Provider Marykay Lex, MD       Encounter Date: 07/16/2023  Check In:  Session Check In - 07/16/23 1129       Check-In   Supervising physician immediately available to respond to emergencies [facility] MD immediately available    Physician(s) Bernadene Person, NP    Location [facility]    Staff Present Cristy Hilts, MS, ACSM-CEP, Exercise Physiologist;Wildon Cuevas, RN, BSN;Johnny Hale Bogus, MS, Exercise Physiologist;Jetta Dan Humphreys BS, ACSM-CEP, Exercise Physiologist;David Makemson, MS, ACSM-CEP, CCRP, Exercise Physiologist    Virtual Visit No    Medication changes reported     No    Fall or balance concerns reported    No    Tobacco Cessation No Change    Current number of cigarettes/nicotine per day     5    Warm-up and Cool-down Performed as group-led instruction    Resistance Training Performed Yes    VAD Patient? No    PAD/SET Patient? No      Pain Assessment   Currently in Pain? No/denies    Multiple Pain Sites No             Capillary Blood Glucose: No results found for this or any previous visit (from the past 24 hours).   Exercise Prescription Changes - 07/16/23 1032       Response to Exercise   Blood Pressure (Admit) 112/70    Blood Pressure (Exercise) 166/90    Blood Pressure (Exit) 118/78    Heart Rate (Admit) 89 bpm    Heart Rate (Exercise) 143 bpm    Heart Rate (Exit) 97 bpm    Rating of Perceived Exertion (Exercise) 13    Symptoms None    Comments The seat on the upright bike was uncomfortable for [name]. Switched to the recumbent bike, which was much more comfortable. Will use the RB going forward.    Duration Continue with 30 min of aerobic exercise without  signs/symptoms of physical distress.    Intensity THRR unchanged      Progression   Progression Continue to progress workloads to maintain intensity without signs/symptoms of physical distress.    Average METs 2.6      Resistance Training   Training Prescription Yes    Weight 3 lbs    Reps 10-15    Time 10 Minutes      Interval Training   Interval Training No      Bike   Level 1    Watts 15    Minutes 12    METs 2.5      Recumbant Bike   Level 1    RPM 64    Watts 17    Minutes 3    METs 2.1      Recumbant Elliptical   Level 1    RPM 60    Watts 83    Minutes 15    METs 3.2             Social History   Tobacco Use  Smoking Status Every Day   Current packs/day: 0.50   Average packs/day: 0.5 packs/day for 29.0 years (14.5 ttl pk-yrs)   Types: Cigarettes  Smokeless Tobacco Never  Tobacco Comments   in rehab  Goals Met:  Exercise tolerated well No report of concerns or symptoms today Strength training completed today  Goals Unmet:  Not Applicable  Comments: Pt started cardiac rehab today.  Pt tolerated light exercise without difficulty. Resting BP within normal limits, Exertional BP's noted on the bike. Will continue to monitor BP, telemetry-Sinus Rhythm, asymptomatic.  Medication list reconciled. Pt denies barriers to medicaiton compliance.  PSYCHOSOCIAL ASSESSMENT:  PHQ-6. Will review PHq2-9 in the upcoming week. A counseling referral has been placed per patient's request   Pt enjoys watching movies.   Pt oriented to exercise equipment and routine.    Understanding verbalized.Thayer Headings RN BSN     Dr. Armanda Magic is Medical Director for Cardiac Rehab at [facility].

## 2023-07-18 ENCOUNTER — Encounter (HOSPITAL_COMMUNITY): Payer: 59

## 2023-07-20 ENCOUNTER — Encounter (HOSPITAL_COMMUNITY): Payer: 59

## 2023-07-23 ENCOUNTER — Encounter (HOSPITAL_COMMUNITY): Payer: 59

## 2023-07-25 ENCOUNTER — Telehealth (HOSPITAL_COMMUNITY): Payer: Self-pay | Admitting: *Deleted

## 2023-07-25 ENCOUNTER — Encounter (HOSPITAL_COMMUNITY)
Admission: RE | Admit: 2023-07-25 | Discharge: 2023-07-25 | Disposition: A | Discharge status: Home / Self Care | Payer: 59 | Source: Ambulatory Visit | Attending: Cardiology | Admitting: Cardiology

## 2023-07-25 NOTE — Telephone Encounter (Signed)
Eliane says she has been absent as she in the process of moving. Drew will let us know when she gets settled.Thayer Headings RN BSN

## 2023-07-27 ENCOUNTER — Telehealth: Payer: Self-pay | Admitting: Pharmacist

## 2023-07-27 ENCOUNTER — Ambulatory Visit: Payer: 59 | Attending: Cardiovascular Disease | Admitting: Pharmacist

## 2023-07-27 ENCOUNTER — Telehealth: Payer: Self-pay | Admitting: Pharmacy Technician

## 2023-07-27 ENCOUNTER — Encounter (HOSPITAL_COMMUNITY): Payer: 59

## 2023-07-27 ENCOUNTER — Encounter: Payer: Self-pay | Admitting: Pharmacist

## 2023-07-27 ENCOUNTER — Other Ambulatory Visit (HOSPITAL_COMMUNITY): Payer: Self-pay

## 2023-07-27 VITALS — Ht 63.0 in | Wt 191.4 lb

## 2023-07-27 DIAGNOSIS — E782 Mixed hyperlipidemia: Secondary | ICD-10-CM | POA: Diagnosis not present

## 2023-07-27 DIAGNOSIS — Z955 Presence of coronary angioplasty implant and graft: Secondary | ICD-10-CM | POA: Diagnosis not present

## 2023-07-27 DIAGNOSIS — I2510 Atherosclerotic heart disease of native coronary artery without angina pectoris: Secondary | ICD-10-CM

## 2023-07-27 DIAGNOSIS — I25118 Atherosclerotic heart disease of native coronary artery with other forms of angina pectoris: Secondary | ICD-10-CM

## 2023-07-27 DIAGNOSIS — I700 Atherosclerosis of aorta: Secondary | ICD-10-CM | POA: Insufficient documentation

## 2023-07-27 DIAGNOSIS — E669 Obesity, unspecified: Secondary | ICD-10-CM | POA: Insufficient documentation

## 2023-07-27 DIAGNOSIS — I200 Unstable angina: Secondary | ICD-10-CM

## 2023-07-27 NOTE — Telephone Encounter (Signed)
Please complete PA for Wegovy.

## 2023-07-27 NOTE — Telephone Encounter (Signed)
Sent over extra documents for CV risk

## 2023-07-27 NOTE — Telephone Encounter (Signed)
Pharmacy Patient Advocate Encounter   Received notification from Pt Calls Messages that prior authorization for wegovy is required/requested.   Insurance verification completed.   The patient is insured through [facility] .   Per test claim: PA required; PA submitted to above mentioned insurance via CoverMyMeds Key/confirmation #/EOC [id number] Status is pending

## 2023-07-27 NOTE — Progress Notes (Signed)
Patient ID: [name]                 DOB: December 30, 1970                    MRN: [id number]     HPI: [name] is a 53 y.o. female patient referred to lipid clinic by Bettina Gavia PAC. Has not seen cardiologist in outpatient setting yet. PMH is significant for HTN, angina, substance use in remission, MDD, GAD, PTSD, and cigarette smoking.  Patient seen by Dr Herbie Baltimore in hospital for chest pain. LHC performed by Dr Lynnette Caffey on 04/04/23.  Managed on atorvastatin 80mg  for CAD with no patient reported adverse effects. LPA level elevated, LDL levels have dropped.  Also interested in weight loss therapy due to CAD and angina symptoms.   Had quit smoking but ran out of Chantix. Started smoking again. Restarted Chantix and is now back down to 2 cigarettes a day.  Eats all her meals at home. Typically focuses on fruits and vegetables along with chicken. Drinks water or fruit juice. Goes to cardiac rehab 3x weekly and is physically active.  Denies CP, SOB, edema.   Current Medications:  Atorvastatin 80mg  daily  Intolerances: N/A  Risk Factors:  CAD Angina Smoking  LDL goal: <55  Labs:TC 108, Trigs 155, HDL 55, LDL 30, LPA 224.3 (04/05/23)  Imaging: Coronary artery calcifications and aortic atherosclerosis.  Past Medical History:  Diagnosis Date   Acid reflux    Anxiety    Arthritis    Asthma    Depression    Diabetes (HCC)    Drug abuse (HCC)    ETOH abuse    High cholesterol    Hypertension    Manic-depressive disorder (HCC)    PTSD (post-traumatic stress disorder)    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    Current Outpatient Medications on File Prior to Visit  Medication Sig Dispense Refill   amLODipine (NORVASC) 5 MG tablet Take 5 mg by mouth daily.     OXcarbazepine (TRILEPTAL) 150 MG tablet Take 150 mg by mouth 2 (two) times daily.     QUEtiapine (SEROQUEL) 100 MG tablet Take 100 mg by mouth at bedtime.     rosuvastatin (CRESTOR) 40 MG tablet Take 40 mg by mouth daily.      sertraline (ZOLOFT) 25 MG tablet Take 25 mg by mouth 2 (two) times daily.     acetaminophen (TYLENOL) 500 MG tablet Take 1 tablet (500 mg total) by mouth in the morning, at noon, in the evening, and at bedtime. 28 tablet 0   ADVAIR HFA 115-21 MCG/ACT inhaler Inhale 2 puffs into the lungs 2 (two) times daily.     albuterol (VENTOLIN HFA) 108 (90 Base) MCG/ACT inhaler Inhale 1-2 puffs into the lungs every 6 (six) hours as needed for wheezing or shortness of breath. 18 g 0   aspirin EC 81 MG tablet Take 81 mg by mouth daily. Swallow whole.     atorvastatin (LIPITOR) 80 MG tablet Take 1 tablet (80 mg total) by mouth daily. 90 tablet 3   escitalopram (LEXAPRO) 10 MG tablet Take 10 mg by mouth daily.     fluticasone (FLONASE) 50 MCG/ACT nasal spray Place 1 spray into both nostrils daily.     gabapentin (NEURONTIN) 600 MG tablet Take 600 mg by mouth 3 (three) times daily.     lidocaine (LIDODERM) 5 % Place 1 patch onto the skin daily. Remove & Discard patch within 12  hours or as directed by MD 10 patch 0   lurasidone (LATUDA) 40 MG TABS tablet Take 40 mg by mouth every evening.     methocarbamol (ROBAXIN) 500 MG tablet Take 1 tablet (500 mg total) by mouth 2 (two) times daily. (Patient taking differently: Take 500 mg by mouth 2 (two) times daily. As needed for sciatic nerve) 20 tablet 0   metoprolol succinate (TOPROL-XL) 25 MG 24 hr tablet Take 1 tablet (25 mg total) by mouth daily. 90 tablet 3   montelukast (SINGULAIR) 10 MG tablet Take 10 mg by mouth daily. (Patient not taking: Reported on 07/10/2023)     nicotine (NICODERM CQ - DOSED IN MG/24 HOURS) 14 mg/24hr patch Place 1 patch (14 mg total) onto the skin daily as needed (nicotine use). (Patient not taking: Reported on 07/10/2023) 28 patch 0   nitroGLYCERIN (NITROSTAT) 0.4 MG SL tablet Place 1 tablet (0.4 mg total) under the tongue every 5 (five) minutes as needed. 25 tablet 3   omeprazole (PRILOSEC) 20 MG capsule Take 20 mg by mouth daily.      ondansetron (ZOFRAN) 4 MG tablet Take 1 tablet (4 mg total) by mouth every 6 (six) hours as needed for nausea or vomiting. 30 tablet 1   ondansetron (ZOFRAN-ODT) 4 MG disintegrating tablet 4mg  ODT q4 hours prn nausea/vomit 12 tablet 0   ticagrelor (BRILINTA) 90 MG TABS tablet Take 1 tablet (90 mg total) by mouth 2 (two) times daily. 180 tablet 2   traZODone (DESYREL) 100 MG tablet Take 100 mg by mouth at bedtime.     triamcinolone ointment (KENALOG) 0.1 % Apply topically 2 (two) times daily.     trospium (SANCTURA) 20 MG tablet TAKE 1 TABLET(20 MG) BY MOUTH TWICE DAILY 60 tablet 2   Varenicline Tartrate, Starter, 0.5 MG X 11 & 1 MG X 42 TBPK Take 0.5 mg x 11 & 1x 42 po TBPK 42 each 0   [DISCONTINUED] clonazePAM (KLONOPIN) 0.5 MG tablet Take 0.5 mg by mouth 3 (three) times daily as needed for anxiety.     [DISCONTINUED] pantoprazole (PROTONIX) 40 MG tablet Take 40 mg by mouth daily.      No current facility-administered medications on file prior to visit.    Allergies  Allergen Reactions   Benadryl [Diphenhydramine]     "heart races"   Penicillins Nausea And Vomiting   Latex Rash    Assessment/Plan:  1. Hyperlipidemia - Patient last LDL of 30 is at goal of <55. Tolerating atorvastatin 80mg  well. Due to revascularization, CAD, coronary calcium, angina, and elevated LPA, would also be a candidate for Repatha. Educated patient on mechanism of action, storage, site selection and possible adverse effects.  Will submit PA and contact patient with result.  2. Weight loss - Patient BMI today 33.9 kg/m2 placing her in Class 1 obesity category. Patient has been physically active going to cardiac rehab 3x weekly. Due to CAD and BMI, would be candidate for [facility]. Discussed mechanism of action, storage, administration, and possible adverse effects. Confirmed patient not pregnant and no personal or family history of medullary thyroid carcinoma (MTC) or Multiple Endocrine Neoplasia syndrome type 2 (MEN 2).  Will submit PA and contact patient with result.  Laural Golden, PharmD, BCACP, CDCES, CPP [address], [address] [place], [place], [address] Phone: [phone], Fax: [phone]

## 2023-07-27 NOTE — Patient Instructions (Addendum)
It was nice meeting you today  We would like to keep your LDL less than 55. I will submit the prior authorization for Repatha and let you know the response  For weight loss, I will submit the prior authorization for the [facility] and contact you with their response as well    Laural Golden, PharmD, BCACP, CDCES, CPP [address] [place], [place], [address] Phone: [phone], Fax: [phone]

## 2023-07-30 ENCOUNTER — Encounter (HOSPITAL_COMMUNITY): Payer: 59

## 2023-07-30 NOTE — Progress Notes (Signed)
Cardiac Individual Treatment Plan  Patient Details  Name: [name] MRN: [id number] Date of Birth: 02/19/1971 Referring Provider:   Flowsheet Row INTENSIVE CARDIAC REHAB ORIENT from 07/10/2023 in [facility]  Referring Provider Marykay Lex, MD       Initial Encounter Date:  Flowsheet Row INTENSIVE CARDIAC REHAB ORIENT from 07/10/2023 in [facility]  Date 07/10/23       Visit Diagnosis: 04/05/23 DES x 2 LAD  Patient's Home Medications on Admission:  Current Outpatient Medications:    acetaminophen (TYLENOL) 500 MG tablet, Take 1 tablet (500 mg total) by mouth in the morning, at noon, in the evening, and at bedtime., Disp: 28 tablet, Rfl: 0   ADVAIR HFA 115-21 MCG/ACT inhaler, Inhale 2 puffs into the lungs 2 (two) times daily., Disp: , Rfl:    albuterol (VENTOLIN HFA) 108 (90 Base) MCG/ACT inhaler, Inhale 1-2 puffs into the lungs every 6 (six) hours as needed for wheezing or shortness of breath., Disp: 18 g, Rfl: 0   amLODipine (NORVASC) 5 MG tablet, Take 5 mg by mouth daily., Disp: , Rfl:    aspirin EC 81 MG tablet, Take 81 mg by mouth daily. Swallow whole., Disp: , Rfl:    atorvastatin (LIPITOR) 80 MG tablet, Take 1 tablet (80 mg total) by mouth daily., Disp: 90 tablet, Rfl: 3   escitalopram (LEXAPRO) 10 MG tablet, Take 10 mg by mouth daily., Disp: , Rfl:    fluticasone (FLONASE) 50 MCG/ACT nasal spray, Place 1 spray into both nostrils daily., Disp: , Rfl:    gabapentin (NEURONTIN) 600 MG tablet, Take 600 mg by mouth 3 (three) times daily., Disp: , Rfl:    lidocaine (LIDODERM) 5 %, Place 1 patch onto the skin daily. Remove & Discard patch within 12 hours or as directed by MD, Disp: 10 patch, Rfl: 0   lurasidone (LATUDA) 40 MG TABS tablet, Take 40 mg by mouth every evening., Disp: , Rfl:    methocarbamol (ROBAXIN) 500 MG tablet, Take 1 tablet (500 mg total)  by mouth 2 (two) times daily. (Patient taking differently: Take 500 mg by mouth 2 (two) times daily. As needed for sciatic nerve), Disp: 20 tablet, Rfl: 0   metoprolol succinate (TOPROL-XL) 25 MG 24 hr tablet, Take 1 tablet (25 mg total) by mouth daily., Disp: 90 tablet, Rfl: 3   montelukast (SINGULAIR) 10 MG tablet, Take 10 mg by mouth daily. (Patient not taking: Reported on 07/10/2023), Disp: , Rfl:    nitroGLYCERIN (NITROSTAT) 0.4 MG SL tablet, Place 1 tablet (0.4 mg total) under the tongue every 5 (five) minutes as needed., Disp: 25 tablet, Rfl: 3   omeprazole (PRILOSEC) 20 MG capsule, Take 20 mg by mouth daily., Disp: , Rfl:    ondansetron (ZOFRAN) 4 MG tablet, Take 1 tablet (4 mg total) by mouth every 6 (six) hours as needed for nausea or vomiting., Disp: 30 tablet, Rfl: 1   ondansetron (ZOFRAN-ODT) 4 MG disintegrating tablet, 4mg  ODT q4 hours prn nausea/vomit, Disp: 12 tablet, Rfl: 0   OXcarbazepine (TRILEPTAL) 150 MG tablet, Take 150 mg by mouth 2 (two) times daily., Disp: , Rfl:    QUEtiapine (SEROQUEL) 100 MG tablet, Take 100 mg by mouth at bedtime., Disp: , Rfl:    rosuvastatin (CRESTOR) 40 MG tablet, Take 40 mg by mouth daily. (Patient not taking: Reported on 07/27/2023), Disp: , Rfl:    sertraline (  ZOLOFT) 25 MG tablet, Take 25 mg by mouth 2 (two) times daily., Disp: , Rfl:    ticagrelor (BRILINTA) 90 MG TABS tablet, Take 1 tablet (90 mg total) by mouth 2 (two) times daily., Disp: 180 tablet, Rfl: 2   traZODone (DESYREL) 100 MG tablet, Take 100 mg by mouth at bedtime., Disp: , Rfl:    triamcinolone ointment (KENALOG) 0.1 %, Apply topically 2 (two) times daily., Disp: , Rfl:    trospium (SANCTURA) 20 MG tablet, TAKE 1 TABLET(20 MG) BY MOUTH TWICE DAILY, Disp: 60 tablet, Rfl: 2   Varenicline Tartrate, Starter, 0.5 MG X 11 & 1 MG X 42 TBPK, Take 0.5 mg x 11 & 1x 42 po TBPK, Disp: 42 each, Rfl: 0  Past Medical History: Past Medical History:  Diagnosis Date   Acid reflux    Anxiety     Arthritis    Asthma    Depression    Diabetes (HCC)    Drug abuse (HCC)    ETOH abuse    High cholesterol    Hypertension    Manic-depressive disorder (HCC)    PTSD (post-traumatic stress disorder)    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    Tobacco Use: Social History   Tobacco Use  Smoking Status Every Day   Current packs/day: 0.50   Average packs/day: 0.5 packs/day for 29.0 years (14.5 ttl pk-yrs)   Types: Cigarettes  Smokeless Tobacco Never  Tobacco Comments   in rehab    Labs: Review Flowsheet  More data may exist      Latest Ref Rng & Units 09/02/2007 09/03/2007 05/04/2021 08/02/2021 04/05/2023  Labs for ITP Cardiac and Pulmonary Rehab  Cholestrol 0 - 200 mg/dL - 578        ATP III CLASSIFICATION:  <200     mg/dL   Desirable  469-629  mg/dL   Borderline High  >=528    mg/dL   High  413  244  010   LDL (calc) 0 - 99 mg/dL - 77        Total Cholesterol/HDL:CHD Risk Coronary Heart Disease Risk Table                     Men   Women  1/2 Average Risk   3.4   3.3  97  98  30   HDL-C >40 mg/dL - 50  67  69  55   Trlycerides <150 mg/dL - 99  272  536  644   Hemoglobin A1c 4.8 - 5.6 % 5.8 (NOTE)   The ADA recommends the following therapeutic goals for glycemic   control related to Hgb A1C measurement:   Goal of Therapy:   < 7.0% Hgb A1C   Action Suggested:  > 8.0% Hgb A1C   Ref:  Diabetes Care, 22, Suppl. 1, 1999  - 6.2  5.4  -    Capillary Blood Glucose: Lab Results  Component Value Date   GLUCAP 101 (H) 07/26/2011   GLUCAP 143 (H) 07/26/2011   GLUCAP 89 07/25/2011     Exercise Target Goals: Exercise Program Goal: Individual exercise prescription set using results from initial 6 min walk test and THRR while considering  patient's activity barriers and safety.   Exercise Prescription Goal: Initial exercise prescription builds to 30-45 minutes a day of aerobic activity, 2-3 days per week.  Home exercise guidelines will be given to patient during program as part of exercise  prescription that the participant will acknowledge.  Activity  Barriers & Risk Stratification:  Activity Barriers & Cardiac Risk Stratification - 07/10/23 1022       Activity Barriers & Cardiac Risk Stratification   Activity Barriers None    Cardiac Risk Stratification Moderate             6 Minute Walk:  6 Minute Walk     Row Name 07/10/23 1127         6 Minute Walk   Phase Initial     Distance 1560 feet     Walk Time 6 minutes     # of Rest Breaks 0     MPH 2.95     METS 2.93     RPE 11     Perceived Dyspnea  1     VO2 Peak 15.81     Symptoms Yes (comment)     Comments Mild shortness of breath.     Resting HR 81 bpm     Resting BP 138/78     Resting Oxygen Saturation  99 %     Exercise Oxygen Saturation  during 6 min walk 99 %     Max Ex. HR 138 bpm     Max Ex. BP 158/72     2 Minute Post BP 128/78              Oxygen Initial Assessment:   Oxygen Re-Evaluation:   Oxygen Discharge (Final Oxygen Re-Evaluation):   Initial Exercise Prescription:  Initial Exercise Prescription - 07/10/23 1200       Date of Initial Exercise RX and Referring Provider   Date 07/10/23    Referring Provider Marykay Lex, MD    Expected Discharge Date 10/26/23      Treadmill   MPH 1.7    Grade 0    Minutes 15    METs 2.3      Recumbant Elliptical   Level 1    RPM 1    Watts 25    Minutes 15    METs 2.9      Prescription Details   Frequency (times per week) 3    Duration Progress to 30 minutes of continuous aerobic without signs/symptoms of physical distress      Intensity   THRR 40-80% of Max Heartrate 60-120    Ratings of Perceived Exertion 11-13    Perceived Dyspnea 0-4      Progression   Progression Continue to progress workloads to maintain intensity without signs/symptoms of physical distress.      Resistance Training   Training Prescription Yes    Weight 3 lbs    Reps 10-15             Perform Capillary Blood Glucose checks as  needed.  Exercise Prescription Changes:   Exercise Prescription Changes     Row Name 07/16/23 1032             Response to Exercise   Blood Pressure (Admit) 112/70       Blood Pressure (Exercise) 166/90       Blood Pressure (Exit) 118/78       Heart Rate (Admit) 89 bpm       Heart Rate (Exercise) 143 bpm       Heart Rate (Exit) 97 bpm       Rating of Perceived Exertion (Exercise) 13       Symptoms None       Comments The seat on the upright bike was uncomfortable for [name]. Switched to the  recumbent bike, which was much more comfortable. Will use the RB going forward.       Duration Continue with 30 min of aerobic exercise without signs/symptoms of physical distress.       Intensity THRR unchanged         Progression   Progression Continue to progress workloads to maintain intensity without signs/symptoms of physical distress.       Average METs 2.6         Resistance Training   Training Prescription Yes       Weight 3 lbs       Reps 10-15       Time 10 Minutes         Interval Training   Interval Training No         Bike   Level 1       Watts 15       Minutes 12       METs 2.5         Recumbant Bike   Level 1       RPM 64       Watts 17       Minutes 3       METs 2.1         Recumbant Elliptical   Level 1       RPM 60       Watts 83       Minutes 15       METs 3.2                Exercise Comments:   Exercise Comments     Row Name 07/16/23 1137           Exercise Comments Caia tolerated first exercise session well without symptoms. Switched exercise prescription from the treadmill due to patient preference. The upright bike seat was uncomfortable for her, switched to the recumbent bike.                Exercise Goals and Review:   Exercise Goals     Row Name 07/10/23 1022             Exercise Goals   Increase Physical Activity Yes       Intervention Provide advice, education, support and counseling about physical  activity/exercise needs.;Develop an individualized exercise prescription for aerobic and resistive training based on initial evaluation findings, risk stratification, comorbidities and participant's personal goals.       Expected Outcomes Short Term: Attend rehab on a regular basis to increase amount of physical activity.;Long Term: Add in home exercise to make exercise part of routine and to increase amount of physical activity.;Long Term: Exercising regularly at least 3-5 days a week.       Increase Strength and Stamina Yes       Intervention Provide advice, education, support and counseling about physical activity/exercise needs.;Develop an individualized exercise prescription for aerobic and resistive training based on initial evaluation findings, risk stratification, comorbidities and participant's personal goals.       Expected Outcomes Short Term: Increase workloads from initial exercise prescription for resistance, speed, and METs.;Short Term: Perform resistance training exercises routinely during rehab and add in resistance training at home;Long Term: Improve cardiorespiratory fitness, muscular endurance and strength as measured by increased METs and functional capacity ( )       Able to understand and use rate of perceived exertion (RPE) scale Yes       Intervention Provide education and  explanation on how to use RPE scale       Expected Outcomes Short Term: Able to use RPE daily in rehab to express subjective intensity level;Long Term:  Able to use RPE to guide intensity level when exercising independently       Knowledge and understanding of Target Heart Rate Range (THRR) Yes       Intervention Provide education and explanation of THRR including how the numbers were predicted and where they are located for reference       Expected Outcomes Short Term: Able to state/look up THRR;Long Term: Able to use THRR to govern intensity when exercising independently;Short Term: Able to use daily as  guideline for intensity in rehab       Able to check pulse independently Yes       Intervention Provide education and demonstration on how to check pulse in carotid and radial arteries.;Review the importance of being able to check your own pulse for safety during independent exercise       Expected Outcomes Short Term: Able to explain why pulse checking is important during independent exercise;Long Term: Able to check pulse independently and accurately       Understanding of Exercise Prescription Yes       Intervention Provide education, explanation, and written materials on patient's individual exercise prescription       Expected Outcomes Short Term: Able to explain program exercise prescription;Long Term: Able to explain home exercise prescription to exercise independently                Exercise Goals Re-Evaluation :  Exercise Goals Re-Evaluation     Row Name 07/16/23 1137             Exercise Goal Re-Evaluation   Exercise Goals Review Increase Physical Activity;Increase Strength and Stamina;Able to understand and use rate of perceived exertion (RPE) scale       Comments Kayia was able to understand and use the RPE scale appropriately. The upright bike seat was uncomfortable for her, switched to the recumbent bike, which she tolerated well.       Expected Outcomes Progress workloads as tolerated to help improve cardiorespiratory fitness.                Discharge Exercise Prescription (Final Exercise Prescription Changes):  Exercise Prescription Changes - 07/16/23 1032       Response to Exercise   Blood Pressure (Admit) 112/70    Blood Pressure (Exercise) 166/90    Blood Pressure (Exit) 118/78    Heart Rate (Admit) 89 bpm    Heart Rate (Exercise) 143 bpm    Heart Rate (Exit) 97 bpm    Rating of Perceived Exertion (Exercise) 13    Symptoms None    Comments The seat on the upright bike was uncomfortable for [name]. Switched to the recumbent bike, which was much more  comfortable. Will use the RB going forward.    Duration Continue with 30 min of aerobic exercise without signs/symptoms of physical distress.    Intensity THRR unchanged      Progression   Progression Continue to progress workloads to maintain intensity without signs/symptoms of physical distress.    Average METs 2.6      Resistance Training   Training Prescription Yes    Weight 3 lbs    Reps 10-15    Time 10 Minutes      Interval Training   Interval Training No      Bike   Level 1  Watts 15    Minutes 12    METs 2.5      Recumbant Bike   Level 1    RPM 64    Watts 17    Minutes 3    METs 2.1      Recumbant Elliptical   Level 1    RPM 60    Watts 83    Minutes 15    METs 3.2             Nutrition:  Target Goals: Understanding of nutrition guidelines, daily intake of sodium 1500mg , cholesterol 200mg , calories 30% from fat and 7% or less from saturated fats, daily to have 5 or more servings of fruits and vegetables.  Biometrics:  Pre Biometrics - 07/10/23 1022       Pre Biometrics   Height 5' 3.5" (1.613 m)    Waist Circumference 43.5 inches    Hip Circumference 44.75 inches    Waist to Hip Ratio 0.97 %    BMI (Calculated) 34.09    Triceps Skinfold 28 mm    % Body Fat 44.4 %    Grip Strength 32 kg    Flexibility 17.25 in    Single Leg Stand 5.97 seconds              Nutrition Therapy Plan and Nutrition Goals:   Nutrition Assessments:  MEDIFICTS Score Key: >=70 Need to make dietary changes  40-70 Heart Healthy Diet <= 40 Therapeutic Level Cholesterol Diet    Picture Your Plate Scores: <16 Unhealthy dietary pattern with much room for improvement. 41-50 Dietary pattern unlikely to meet recommendations for good health and room for improvement. 51-60 More healthful dietary pattern, with some room for improvement.  >60 Healthy dietary pattern, although there may be some specific behaviors that could be improved.    Nutrition Goals  Re-Evaluation:   Nutrition Goals Re-Evaluation:   Nutrition Goals Discharge (Final Nutrition Goals Re-Evaluation):   Psychosocial: Target Goals: Acknowledge presence or absence of significant depression and/or stress, maximize coping skills, provide positive support system. Participant is able to verbalize types and ability to use techniques and skills needed for reducing stress and depression.  Initial Review & Psychosocial Screening:  Initial Psych Review & Screening - 07/10/23 1058       Initial Review   Current issues with History of Depression;Current Stress Concerns    Source of Stress Concerns Financial;Transportation    Comments Ghina currently lives with her sister but is in search of other living arrangements.      Family Dynamics   Good Support System? Yes    Comments She has good support from her daughter. She also has a sponsor and a friend that she can talk to. She is intersted in talking to a Veterinary surgeon.      Barriers   Psychosocial barriers to participate in program Psychosocial barriers identified (see note);The patient should benefit from training in stress management and relaxation.      Screening Interventions   Interventions Encouraged to exercise;To provide support and resources with identified psychosocial needs;Provide feedback about the scores to participant    Expected Outcomes Short Term goal: Utilizing psychosocial counselor, staff and physician to assist with identification of specific Stressors or current issues interfering with healing process. Setting desired goal for each stressor or current issue identified.;Long Term Goal: Stressors or current issues are controlled or eliminated.;Short Term goal: Identification and review with participant of any Quality of Life or Depression concerns found by scoring the questionnaire.;Long  Term goal: The participant improves quality of Life and PHQ9 Scores as seen by post scores and/or verbalization of changes              Quality of Life Scores:  Quality of Life - 07/10/23 1222       Quality of Life   Select Quality of Life      Quality of Life Scores   Health/Function Pre 22.7 %    Socioeconomic Pre 23.79 %    Psych/Spiritual Pre 23.14 %    Family Pre 25.2 %    GLOBAL Pre 23.38 %            Scores of 19 and below usually indicate a poorer quality of life in these areas.  A difference of  2-3 points is a clinically meaningful difference.  A difference of 2-3 points in the total score of the Quality of Life Index has been associated with significant improvement in overall quality of life, self-image, physical symptoms, and general health in studies assessing change in quality of life.  PHQ-9: Review Flowsheet       07/10/2023 12/21/2022  Depression screen PHQ 2/9  Decreased Interest 1 1  Down, Depressed, Hopeless 0 1  PHQ - 2 Score 1 2  Altered sleeping 1 3  Tired, decreased energy 1 1  Change in appetite 2 1  Feeling bad or failure about yourself  1 0  Trouble concentrating 0 1  Moving slowly or fidgety/restless 0 0  Suicidal thoughts 0 0  PHQ-9 Score 6 8  Difficult doing work/chores Not difficult at all -   Interpretation of Total Score  Total Score Depression Severity:  1-4 = Minimal depression, 5-9 = Mild depression, 10-14 = Moderate depression, 15-19 = Moderately severe depression, 20-27 = Severe depression   Psychosocial Evaluation and Intervention:   Psychosocial Re-Evaluation:  Psychosocial Re-Evaluation     Row Name 07/17/23 1308 07/26/23 1404           Psychosocial Re-Evaluation   Current issues with Current Stress Concerns;History of Depression Current Stress Concerns;History of Depression      Comments Keaunna did not voice any increased concerns or stressors on her first day of exercise.Will review PHq2-9 in the upcoming week. A counseling referral has been placed per patient's request Oreta did not voice any increased concerns or stressors on her  first day of exercise.Will review PHq2-9 when [name] returns to exercise. A counseling referral has been placed per patient's request.      Expected Outcomes Concepcion will have decreased or controlled depression/ stressors upon completion of cardiac rehab. Breon will have decreased or controlled depression/ stressors upon completion of cardiac rehab.      Interventions Stress management education;Relaxation education;Encouraged to attend Cardiac Rehabilitation for the exercise Stress management education;Relaxation education;Encouraged to attend Cardiac Rehabilitation for the exercise      Continue Psychosocial Services  Follow up required by staff Follow up required by staff        Initial Review   Source of Stress Concerns Financial Financial      Comments Will continue to monitor and offer support as needed. Will continue to monitor and offer support as needed.               Psychosocial Discharge (Final Psychosocial Re-Evaluation):  Psychosocial Re-Evaluation - 07/26/23 1404       Psychosocial Re-Evaluation   Current issues with Current Stress Concerns;History of Depression    Comments Verbie did not voice any increased concerns  or stressors on her first day of exercise.Will review PHq2-9 when [name] returns to exercise. A counseling referral has been placed per patient's request.    Expected Outcomes Noah will have decreased or controlled depression/ stressors upon completion of cardiac rehab.    Interventions Stress management education;Relaxation education;Encouraged to attend Cardiac Rehabilitation for the exercise    Continue Psychosocial Services  Follow up required by staff      Initial Review   Source of Stress Concerns Financial    Comments Will continue to monitor and offer support as needed.             Vocational Rehabilitation: Provide vocational rehab assistance to qualifying candidates.   Vocational Rehab Evaluation & Intervention:  Vocational Rehab -  07/10/23 1100       Initial Vocational Rehab Evaluation & Intervention   Assessment shows need for Vocational Rehabilitation Yes    Vocational Rehab Packet given to patient 07/10/23      Vocational Rehab Re-Evaulation   Comments Cachet is interested in vocational rehab. She is currently taking classes trying to get her Associates Degree.             Education: Education Goals: Education classes will be provided on a weekly basis, covering required topics. Participant will state understanding/return demonstration of topics presented.     Core Videos: Exercise    Move It!  Clinical staff conducted group or individual video education with verbal and written material and guidebook.  Patient learns the recommended Pritikin exercise program. Exercise with the goal of living a long, healthy life. Some of the health benefits of exercise include controlled diabetes, healthier blood pressure levels, improved cholesterol levels, improved heart and lung capacity, improved sleep, and better body composition. Everyone should speak with their doctor before starting or changing an exercise routine.  Biomechanical Limitations Clinical staff conducted group or individual video education with verbal and written material and guidebook.  Patient learns how biomechanical limitations can impact exercise and how we can mitigate and possibly overcome limitations to have an impactful and balanced exercise routine.  Body Composition Clinical staff conducted group or individual video education with verbal and written material and guidebook.  Patient learns that body composition (ratio of muscle mass to fat mass) is a key component to assessing overall fitness, rather than body weight alone. Increased fat mass, especially visceral belly fat, can put [place] at increased risk for metabolic syndrome, type 2 diabetes, heart disease, and even death. It is recommended to combine diet and exercise (cardiovascular and  resistance training) to improve your body composition. Seek guidance from your physician and exercise physiologist before implementing an exercise routine.  Exercise Action Plan Clinical staff conducted group or individual video education with verbal and written material and guidebook.  Patient learns the recommended strategies to achieve and enjoy long-term exercise adherence, including variety, self-motivation, self-efficacy, and positive decision making. Benefits of exercise include fitness, good health, weight management, more energy, better sleep, less stress, and overall well-being.  Medical   Heart Disease Risk Reduction Clinical staff conducted group or individual video education with verbal and written material and guidebook.  Patient learns our heart is our most vital organ as it circulates oxygen, nutrients, white blood cells, and hormones throughout the entire body, and carries waste away. Data supports a plant-based eating plan like the Pritikin Program for its effectiveness in slowing progression of and reversing heart disease. The video provides a number of recommendations to address heart disease.   Metabolic Syndrome  and Belly Fat  Clinical staff conducted group or individual video education with verbal and written material and guidebook.  Patient learns what metabolic syndrome is, how it leads to heart disease, and how one can reverse it and keep it from coming back. You have metabolic syndrome if you have 3 of the following 5 criteria: abdominal obesity, high blood pressure, high triglycerides, low HDL cholesterol, and high blood sugar.  Hypertension and Heart Disease Clinical staff conducted group or individual video education with verbal and written material and guidebook.  Patient learns that high blood pressure, or hypertension, is very common in the [name]. Hypertension is largely due to excessive salt intake, but other important risk factors include being overweight,  physical inactivity, drinking too much alcohol, smoking, and not eating enough potassium from fruits and vegetables. High blood pressure is a leading risk factor for heart attack, stroke, congestive heart failure, dementia, kidney failure, and premature death. Long-term effects of excessive salt intake include stiffening of the arteries and thickening of heart muscle and organ damage. Recommendations include ways to reduce hypertension and the risk of heart disease.  Diseases of Our Time - Focusing on Diabetes Clinical staff conducted group or individual video education with verbal and written material and guidebook.  Patient learns why the best way to stop diseases of our time is prevention, through food and other lifestyle changes. Medicine (such as prescription pills and surgeries) is often only a Band-Aid on the problem, not a long-term solution. Most common diseases of our time include obesity, type 2 diabetes, hypertension, heart disease, and cancer. The Pritikin Program is recommended and has been proven to help reduce, reverse, and/or prevent the damaging effects of metabolic syndrome.  Nutrition   Overview of the Pritikin Eating Plan  Clinical staff conducted group or individual video education with verbal and written material and guidebook.  Patient learns about the Pritikin Eating Plan for disease risk reduction. The Pritikin Eating Plan emphasizes a wide variety of unrefined, minimally-processed carbohydrates, like fruits, vegetables, whole grains, and legumes. Go, Caution, and Stop food choices are explained. Plant-based and lean animal proteins are emphasized. Rationale provided for low sodium intake for blood pressure control, low added sugars for blood sugar stabilization, and low added fats and oils for coronary artery disease risk reduction and weight management.  Calorie Density  Clinical staff conducted group or individual video education with verbal and written material and  guidebook.  Patient learns about calorie density and how it impacts the Pritikin Eating Plan. Knowing the characteristics of the food you choose will help you decide whether those foods will lead to weight gain or weight loss, and whether you want to consume more or less of them. Weight loss is usually a side effect of the Pritikin Eating Plan because of its focus on low calorie-dense foods.  Label Reading  Clinical staff conducted group or individual video education with verbal and written material and guidebook.  Patient learns about the Pritikin recommended label reading guidelines and corresponding recommendations regarding calorie density, added sugars, sodium content, and whole grains.  Dining Out - Part 1  Clinical staff conducted group or individual video education with verbal and written material and guidebook.  Patient learns that restaurant meals can be sabotaging because they can be so high in calories, fat, sodium, and/or sugar. Patient learns recommended strategies on how to positively address this and avoid unhealthy pitfalls.  Facts on Fats  Clinical staff conducted group or individual video education with verbal and  written material and guidebook.  Patient learns that lifestyle modifications can be just as effective, if not more so, as many medications for lowering your risk of heart disease. A Pritikin lifestyle can help to reduce your risk of inflammation and atherosclerosis (cholesterol build-up, or plaque, in the artery walls). Lifestyle interventions such as dietary choices and physical activity address the cause of atherosclerosis. A review of the types of fats and their impact on blood cholesterol levels, along with dietary recommendations to reduce fat intake is also included.  Nutrition Action Plan  Clinical staff conducted group or individual video education with verbal and written material and guidebook.  Patient learns how to incorporate Pritikin recommendations into  their lifestyle. Recommendations include planning and keeping personal health goals in mind as an important part of their success.  Healthy Mind-Set    Healthy Minds, Bodies, Hearts  Clinical staff conducted group or individual video education with verbal and written material and guidebook.  Patient learns how to identify when they are stressed. Video will discuss the impact of that stress, as well as the many benefits of stress management. Patient will also be introduced to stress management techniques. The way we think, act, and feel has an impact on our hearts.  How Our Thoughts Can Heal Our Hearts  Clinical staff conducted group or individual video education with verbal and written material and guidebook.  Patient learns that negative thoughts can cause depression and anxiety. This can result in negative lifestyle behavior and serious health problems. Cognitive behavioral therapy is an effective method to help control our thoughts in order to change and improve our emotional outlook.  Additional Videos:  Exercise    Improving Performance  Clinical staff conducted group or individual video education with verbal and written material and guidebook.  Patient learns to use a non-linear approach by alternating intensity levels and lengths of time spent exercising to help burn more calories and lose more body fat. Cardiovascular exercise helps improve heart health, metabolism, hormonal balance, blood sugar control, and recovery from fatigue. Resistance training improves strength, endurance, balance, coordination, reaction time, metabolism, and muscle mass. Flexibility exercise improves circulation, posture, and balance. Seek guidance from your physician and exercise physiologist before implementing an exercise routine and learn your capabilities and proper form for all exercise.  Introduction to Yoga  Clinical staff conducted group or individual video education with verbal and written material and  guidebook.  Patient learns about yoga, a discipline of the coming together of mind, breath, and body. The benefits of yoga include improved flexibility, improved range of motion, better posture and core strength, increased lung function, weight loss, and positive self-image. Yoga's heart health benefits include lowered blood pressure, healthier heart rate, decreased cholesterol and triglyceride levels, improved immune function, and reduced stress. Seek guidance from your physician and exercise physiologist before implementing an exercise routine and learn your capabilities and proper form for all exercise.  Medical   Aging: Enhancing Your Quality of Life  Clinical staff conducted group or individual video education with verbal and written material and guidebook.  Patient learns key strategies and recommendations to stay in good physical health and enhance quality of life, such as prevention strategies, having an advocate, securing a Health Care Proxy and Power of Attorney, and keeping a list of medications and system for tracking them. It also discusses how to avoid risk for bone loss.  Biology of Weight Control  Clinical staff conducted group or individual video education with verbal and written material and  guidebook.  Patient learns that weight gain occurs because we consume more calories than we burn (eating more, moving less). Even if your body weight is normal, you may have higher ratios of fat compared to muscle mass. Too much body fat puts you at increased risk for cardiovascular disease, heart attack, stroke, type 2 diabetes, and obesity-related cancers. In addition to exercise, following the Pritikin Eating Plan can help reduce your risk.  Decoding Lab Results  Clinical staff conducted group or individual video education with verbal and written material and guidebook.  Patient learns that lab test reflects one measurement whose values change over time and are influenced by many factors,  including medication, stress, sleep, exercise, food, hydration, pre-existing medical conditions, and more. It is recommended to use the knowledge from this video to become more involved with your lab results and evaluate your numbers to speak with your doctor.   Diseases of Our Time - Overview  Clinical staff conducted group or individual video education with verbal and written material and guidebook.  Patient learns that according to the CDC, 50% to 70% of chronic diseases (such as obesity, type 2 diabetes, elevated lipids, hypertension, and heart disease) are avoidable through lifestyle improvements including healthier food choices, listening to satiety cues, and increased physical activity.  Sleep Disorders Clinical staff conducted group or individual video education with verbal and written material and guidebook.  Patient learns how good quality and duration of sleep are important to overall health and well-being. Patient also learns about sleep disorders and how they impact health along with recommendations to address them, including discussing with a physician.  Nutrition  Dining Out - Part 2 Clinical staff conducted group or individual video education with verbal and written material and guidebook.  Patient learns how to plan ahead and communicate in order to maximize their dining experience in a healthy and nutritious manner. Included are recommended food choices based on the type of restaurant the patient is visiting.   Fueling a Banker conducted group or individual video education with verbal and written material and guidebook.  There is a strong connection between our food choices and our health. Diseases like obesity and type 2 diabetes are very prevalent and are in large-part due to lifestyle choices. The Pritikin Eating Plan provides plenty of food and hunger-curbing satisfaction. It is easy to follow, affordable, and helps reduce health risks.  Menu Workshop   Clinical staff conducted group or individual video education with verbal and written material and guidebook.  Patient learns that restaurant meals can sabotage health goals because they are often packed with calories, fat, sodium, and sugar. Recommendations include strategies to plan ahead and to communicate with the manager, chef, or server to help order a healthier meal.  Planning Your Eating Strategy  Clinical staff conducted group or individual video education with verbal and written material and guidebook.  Patient learns about the Pritikin Eating Plan and its benefit of reducing the risk of disease. The Pritikin Eating Plan does not focus on calories. Instead, it emphasizes high-quality, nutrient-rich foods. By knowing the characteristics of the foods, we choose, we can determine their calorie density and make informed decisions.  Targeting Your Nutrition Priorities  Clinical staff conducted group or individual video education with verbal and written material and guidebook.  Patient learns that lifestyle habits have a tremendous impact on disease risk and progression. This video provides eating and physical activity recommendations based on your personal health goals, such as reducing LDL  cholesterol, losing weight, preventing or controlling type 2 diabetes, and reducing high blood pressure.  Vitamins and Minerals  Clinical staff conducted group or individual video education with verbal and written material and guidebook.  Patient learns different ways to obtain key vitamins and minerals, including through a recommended healthy diet. It is important to discuss all supplements you take with your doctor.   Healthy Mind-Set    Smoking Cessation  Clinical staff conducted group or individual video education with verbal and written material and guidebook.  Patient learns that cigarette smoking and tobacco addiction pose a serious health risk which affects millions of people. Stopping smoking  will significantly reduce the risk of heart disease, lung disease, and many forms of cancer. Recommended strategies for quitting are covered, including working with your doctor to develop a successful plan.  Culinary   Becoming a Set designer conducted group or individual video education with verbal and written material and guidebook.  Patient learns that cooking at home can be healthy, cost-effective, quick, and puts them in control. Keys to cooking healthy recipes will include looking at your recipe, assessing your equipment needs, planning ahead, making it simple, choosing cost-effective seasonal ingredients, and limiting the use of added fats, salts, and sugars.  Cooking - Breakfast and Snacks  Clinical staff conducted group or individual video education with verbal and written material and guidebook.  Patient learns how important breakfast is to satiety and nutrition through the entire day. Recommendations include key foods to eat during breakfast to help stabilize blood sugar levels and to prevent overeating at meals later in the day. Planning ahead is also a key component.  Cooking - Educational psychologist conducted group or individual video education with verbal and written material and guidebook.  Patient learns eating strategies to improve overall health, including an approach to cook more at home. Recommendations include thinking of animal protein as a side on your plate rather than center stage and focusing instead on lower calorie dense options like vegetables, fruits, whole grains, and plant-based proteins, such as beans. Making sauces in large quantities to freeze for later and leaving the skin on your vegetables are also recommended to maximize your experience.  Cooking - Healthy Salads and Dressing Clinical staff conducted group or individual video education with verbal and written material and guidebook.  Patient learns that vegetables, fruits, whole  grains, and legumes are the foundations of the Pritikin Eating Plan. Recommendations include how to incorporate each of these in flavorful and healthy salads, and how to create homemade salad dressings. Proper handling of ingredients is also covered. Cooking - Soups and [employer] - Soups and Desserts Clinical staff conducted group or individual video education with verbal and written material and guidebook.  Patient learns that Pritikin soups and desserts make for easy, nutritious, and delicious snacks and meal components that are low in sodium, fat, sugar, and calorie density, while high in vitamins, minerals, and filling fiber. Recommendations include simple and healthy ideas for soups and desserts.   Overview     The Pritikin Solution Program Overview Clinical staff conducted group or individual video education with verbal and written material and guidebook.  Patient learns that the results of the Pritikin Program have been documented in more than 100 articles published in peer-reviewed journals, and the benefits include reducing risk factors for (and, in some cases, even reversing) high cholesterol, high blood pressure, type 2 diabetes, obesity, and more! An overview of the three  key pillars of the Pritikin Program will be covered: eating well, doing regular exercise, and having a healthy mind-set.  WORKSHOPS  Exercise: Exercise Basics: Building Your Action Plan Clinical staff led group instruction and group discussion with PowerPoint presentation and patient guidebook. To enhance the learning environment the use of posters, models and videos may be added. At the conclusion of this workshop, patients will comprehend the difference between physical activity and exercise, as well as the benefits of incorporating both, into their routine. Patients will understand the FITT (Frequency, Intensity, Time, and Type) principle and how to use it to build an exercise action plan. In addition,  safety concerns and other considerations for exercise and cardiac rehab will be addressed by the presenter. The purpose of this lesson is to promote a comprehensive and effective weekly exercise routine in order to improve patients' overall level of fitness.   Managing Heart Disease: Your Path to a Healthier Heart Clinical staff led group instruction and group discussion with PowerPoint presentation and patient guidebook. To enhance the learning environment the use of posters, models and videos may be added.At the conclusion of this workshop, patients will understand the anatomy and physiology of the heart. Additionally, they will understand how Pritikin's three pillars impact the risk factors, the progression, and the management of heart disease.  The purpose of this lesson is to provide a high-level overview of the heart, heart disease, and how the Pritikin lifestyle positively impacts risk factors.  Exercise Biomechanics Clinical staff led group instruction and group discussion with PowerPoint presentation and patient guidebook. To enhance the learning environment the use of posters, models and videos may be added. Patients will learn how the structural parts of their bodies function and how these functions impact their daily activities, movement, and exercise. Patients will learn how to promote a neutral spine, learn how to manage pain, and identify ways to improve their physical movement in order to promote healthy living. The purpose of this lesson is to expose patients to common physical limitations that impact physical activity. Participants will learn practical ways to adapt and manage aches and pains, and to minimize their effect on regular exercise. Patients will learn how to maintain good posture while sitting, walking, and lifting.  Balance Training and Fall Prevention  Clinical staff led group instruction and group discussion with PowerPoint presentation and patient guidebook. To  enhance the learning environment the use of posters, models and videos may be added. At the conclusion of this workshop, patients will understand the importance of their sensorimotor skills (vision, proprioception, and the vestibular system) in maintaining their ability to balance as they age. Patients will apply a variety of balancing exercises that are appropriate for their current level of function. Patients will understand the common causes for poor balance, possible solutions to these problems, and ways to modify their physical environment in order to minimize their fall risk. The purpose of this lesson is to teach patients about the importance of maintaining balance as they age and ways to minimize their risk of falling.  WORKSHOPS   Nutrition:  Fueling a Ship broker led group instruction and group discussion with PowerPoint presentation and patient guidebook. To enhance the learning environment the use of posters, models and videos may be added. Patients will review the foundational principles of the Pritikin Eating Plan and understand what constitutes a serving size in each of the food groups. Patients will also learn Pritikin-friendly foods that are better choices when away from home and review  make-ahead meal and snack options. Calorie density will be reviewed and applied to three nutrition priorities: weight maintenance, weight loss, and weight gain. The purpose of this lesson is to reinforce (in a group setting) the key concepts around what patients are recommended to eat and how to apply these guidelines when away from home by planning and selecting Pritikin-friendly options. Patients will understand how calorie density may be adjusted for different weight management goals.  Mindful Eating  Clinical staff led group instruction and group discussion with PowerPoint presentation and patient guidebook. To enhance the learning environment the use of posters, models and videos may  be added. Patients will briefly review the concepts of the Pritikin Eating Plan and the importance of low-calorie dense foods. The concept of mindful eating will be introduced as well as the importance of paying attention to internal hunger signals. Triggers for non-hunger eating and techniques for dealing with triggers will be explored. The purpose of this lesson is to provide patients with the opportunity to review the basic principles of the Pritikin Eating Plan, discuss the value of eating mindfully and how to measure internal cues of hunger and fullness using the Hunger Scale. Patients will also discuss reasons for non-hunger eating and learn strategies to use for controlling emotional eating.  Targeting Your Nutrition Priorities Clinical staff led group instruction and group discussion with PowerPoint presentation and patient guidebook. To enhance the learning environment the use of posters, models and videos may be added. Patients will learn how to determine their genetic susceptibility to disease by reviewing their family history. Patients will gain insight into the importance of diet as part of an overall healthy lifestyle in mitigating the impact of genetics and other environmental insults. The purpose of this lesson is to provide patients with the opportunity to assess their personal nutrition priorities by looking at their family history, their own health history and current risk factors. Patients will also be able to discuss ways of prioritizing and modifying the Pritikin Eating Plan for their highest risk areas  Menu  Clinical staff led group instruction and group discussion with PowerPoint presentation and patient guidebook. To enhance the learning environment the use of posters, models and videos may be added. Using menus brought in from [employer], or printed from [employer], patients will apply the Pritikin dining out guidelines that were presented in the [employer] video. Patients will also be able to practice these guidelines in a variety of provided scenarios. The purpose of this lesson is to provide patients with the opportunity to practice hands-on learning of the Pritikin Dining Out guidelines with actual menus and practice scenarios.  Label Reading Clinical staff led group instruction and group discussion with PowerPoint presentation and patient guidebook. To enhance the learning environment the use of posters, models and videos may be added. Patients will review and discuss the Pritikin label reading guidelines presented in Pritikin's Label Reading Educational series video. Using fool labels brought in from local grocery stores and markets, patients will apply the label reading guidelines and determine if the packaged food meet the Pritikin guidelines. The purpose of this lesson is to provide patients with the opportunity to review, discuss, and practice hands-on learning of the Pritikin Label Reading guidelines with actual packaged food labels. Cooking School  Pritikin's [employer] are designed to teach patients ways to prepare quick, simple, and affordable recipes at home. The importance of nutrition's role in chronic disease risk reduction is reflected in its emphasis in  the overall Pritikin program. By learning how to prepare essential core Pritikin Eating Plan recipes, patients will increase control over what they eat; be able to customize the flavor of foods without the use of added salt, sugar, or fat; and improve the quality of the food they consume. By learning a set of core recipes which are easily assembled, quickly prepared, and affordable, patients are more likely to prepare more healthy foods at home. These workshops focus on convenient breakfasts, simple entres, side dishes, and desserts which can be prepared with minimal effort and are consistent with nutrition recommendations for cardiovascular risk reduction. Cooking  [employer] are taught by a Armed forces logistics/support/administrative officer (RD) who has been trained by the [employer]. The chef or RD has a clear understanding of the importance of minimizing - if not completely eliminating - added fat, sugar, and sodium in recipes. Throughout the series of Cooking School Workshop sessions, patients will learn about healthy ingredients and efficient methods of cooking to build confidence in their capability to prepare    Cooking School weekly topics:  Adding Flavor- Sodium-Free  Fast and Healthy Breakfasts  Powerhouse Plant-Based Proteins  Satisfying Salads and Dressings  Simple Sides and Sauces  International Cuisine-Spotlight on the [employer] Zones  Delicious Desserts  Savory Soups  [employer] - Meals in a Astronomer Appetizers and Snacks  Comforting Weekend Breakfasts  One-Pot Wonders   Fast Evening Meals  Landscape architect Your [facility] Plate  WORKSHOPS   Healthy Mindset (Psychosocial):  Focused Goals, Sustainable Changes Clinical staff led group instruction and group discussion with PowerPoint presentation and patient guidebook. To enhance the learning environment the use of posters, models and videos may be added. Patients will be able to apply effective goal setting strategies to establish at least one personal goal, and then take consistent, meaningful action toward that goal. They will learn to identify common barriers to achieving personal goals and develop strategies to overcome them. Patients will also gain an understanding of how our mind-set can impact our ability to achieve goals and the importance of cultivating a positive and growth-oriented mind-set. The purpose of this lesson is to provide patients with a deeper understanding of how to set and achieve personal goals, as well as the tools and strategies needed to overcome common obstacles which may arise along the way.  From Head to Heart: The Power of a Healthy  Outlook  Clinical staff led group instruction and group discussion with PowerPoint presentation and patient guidebook. To enhance the learning environment the use of posters, models and videos may be added. Patients will be able to recognize and describe the impact of emotions and mood on physical health. They will discover the importance of self-care and explore self-care practices which may work for them. Patients will also learn how to utilize the 4 C's to cultivate a healthier outlook and better manage stress and challenges. The purpose of this lesson is to demonstrate to patients how a healthy outlook is an essential part of maintaining good health, especially as they continue their cardiac rehab journey.  Healthy Sleep for a Healthy Heart Clinical staff led group instruction and group discussion with PowerPoint presentation and patient guidebook. To enhance the learning environment the use of posters, models and videos may be added. At the conclusion of this workshop, patients will be able to demonstrate knowledge of the importance of sleep to overall health, well-being, and quality of life. They will understand the symptoms of,  and treatments for, common sleep disorders. Patients will also be able to identify daytime and nighttime behaviors which impact sleep, and they will be able to apply these tools to help manage sleep-related challenges. The purpose of this lesson is to provide patients with a general overview of sleep and outline the importance of quality sleep. Patients will learn about a few of the most common sleep disorders. Patients will also be introduced to the concept of "sleep hygiene," and discover ways to self-manage certain sleeping problems through simple daily behavior changes. Finally, the workshop will motivate patients by clarifying the links between quality sleep and their goals of heart-healthy living.   Recognizing and Reducing Stress Clinical staff led group instruction and  group discussion with PowerPoint presentation and patient guidebook. To enhance the learning environment the use of posters, models and videos may be added. At the conclusion of this workshop, patients will be able to understand the types of stress reactions, differentiate between acute and chronic stress, and recognize the impact that chronic stress has on their health. They will also be able to apply different coping mechanisms, such as reframing negative self-talk. Patients will have the opportunity to practice a variety of stress management techniques, such as deep abdominal breathing, progressive muscle relaxation, and/or guided imagery.  The purpose of this lesson is to educate patients on the role of stress in their lives and to provide healthy techniques for coping with it.  Learning Barriers/Preferences:  Learning Barriers/Preferences - 07/10/23 1059       Learning Barriers/Preferences   Learning Barriers Hearing   Wears hearing aids   Learning Preferences Group Instruction;Individual Instruction;Skilled Demonstration             Education Topics:  Knowledge Questionnaire Score:  Knowledge Questionnaire Score - 07/10/23 1223       Knowledge Questionnaire Score   Pre Score 24/28             Core Components/Risk Factors/Patient Goals at Admission:  Personal Goals and Risk Factors at Admission - 07/10/23 1022       Core Components/Risk Factors/Patient Goals on Admission    Weight Management Yes;Obesity;Weight Loss    Intervention Weight Management/Obesity: Establish reasonable short term and long term weight goals.;Obesity: Provide education and appropriate resources to help participant work on and attain dietary goals.    Admit Weight 195 lb 8.8 oz (88.7 kg)    Expected Outcomes Short Term: Continue to assess and modify interventions until short term weight is achieved;Long Term: Adherence to nutrition and physical activity/exercise program aimed toward attainment of  established weight goal;Weight Loss: Understanding of general recommendations for a balanced deficit meal plan, which promotes 1-2 lb weight loss per week and includes a negative energy balance of [phone] kcal/d    Tobacco Cessation Yes    Number of packs per day 0.25    Intervention Offer self-teaching materials, assist with locating and accessing local/national Quit Smoking programs, and support quit date choice.;Assist the participant in steps to quit. Provide individualized education and counseling about committing to Tobacco Cessation, relapse prevention, and pharmacological support that can be provided by physician.    Expected Outcomes Short Term: Will demonstrate readiness to quit, by selecting a quit date.;Long Term: Complete abstinence from all tobacco products for at least 12 months from quit date.;Short Term: Will quit all tobacco product use, adhering to prevention of relapse plan.    Hypertension Yes    Intervention Provide education on lifestyle modifcations including regular physical activity/exercise, weight management,  moderate sodium restriction and increased consumption of fresh fruit, vegetables, and low fat dairy, alcohol moderation, and smoking cessation.;Monitor prescription use compliance.    Expected Outcomes Short Term: Continued assessment and intervention until BP is < 140/73mm HG in hypertensive participants. < 130/67mm HG in hypertensive participants with diabetes, heart failure or chronic kidney disease.;Long Term: Maintenance of blood pressure at goal levels.    Lipids Yes    Intervention Provide education and support for participant on nutrition & aerobic/resistive exercise along with prescribed medications to achieve LDL 70mg , HDL >40mg .    Expected Outcomes Short Term: Participant states understanding of desired cholesterol values and is compliant with medications prescribed. Participant is following exercise prescription and nutrition guidelines.;Long Term:  Cholesterol controlled with medications as prescribed, with individualized exercise RX and with personalized nutrition plan. Value goals: LDL < 70mg , HDL > 40 mg.    Stress Yes    Intervention Offer individual and/or small group education and counseling on adjustment to heart disease, stress management and health-related lifestyle change. Teach and support self-help strategies.;Refer participants experiencing significant psychosocial distress to appropriate mental health specialists for further evaluation and treatment. When possible, include family members and significant others in education/counseling sessions.    Expected Outcomes Short Term: Participant demonstrates changes in health-related behavior, relaxation and other stress management skills, ability to obtain effective social support, and compliance with psychotropic medications if prescribed.;Long Term: Emotional wellbeing is indicated by absence of clinically significant psychosocial distress or social isolation.             Core Components/Risk Factors/Patient Goals Review:   Goals and Risk Factor Review     Row Name 07/17/23 1410 07/26/23 1405           Core Components/Risk Factors/Patient Goals Review   Personal Goals Review Weight Management/Obesity;Lipids;Hypertension;Stress;Tobacco Cessation Weight Management/Obesity;Lipids;Hypertension;Stress;Tobacco Cessation      Review Dawson started cardiac rehab on 07/16/23. Talea did well with exercise. Resting BP within normal limits, Exertional BP's noted on the bike. Will continue to monitor BP, Will continue to encourage smoking cessation. Sachiko started cardiac rehab on 07/16/23. Uriel did well with exercise. Resting BP within normal limits, Exertional BP's noted on the bike. Will continue to monitor BP, Will continue to encourage smoking cessation. Avey has not returned to exercise since her first day, in the process of moving.      Expected Outcomes Egan will continue  to participate in cardiac rehab for exercise, nutrition and lifestyle modifications Trecia will continue to participate in cardiac rehab for exercise, nutrition and lifestyle modifications               Core Components/Risk Factors/Patient Goals at Discharge (Final Review):   Goals and Risk Factor Review - 07/26/23 1405       Core Components/Risk Factors/Patient Goals Review   Personal Goals Review Weight Management/Obesity;Lipids;Hypertension;Stress;Tobacco Cessation    Review Jannifer started cardiac rehab on 07/16/23. Miguelina did well with exercise. Resting BP within normal limits, Exertional BP's noted on the bike. Will continue to monitor BP, Will continue to encourage smoking cessation. Cathlene has not returned to exercise since her first day, in the process of moving.    Expected Outcomes Gailyn will continue to participate in cardiac rehab for exercise, nutrition and lifestyle modifications             ITP Comments:  ITP Comments     Row Name 07/10/23 1022 07/17/23 1305 07/26/23 1403       ITP Comments Medical Director- Dr. Gloris Manchester  Mayford Knife, MD. Introduction to the [facility] Education/  Intensive Cardiac Rehab Program. Reviewed initial orientation folder. 30 Day ITP Review. Makeshia started cardiac rehab on 07/16/23. Eiko did well with exercise. 30 Day ITP Review. Almetta started cardiac rehab on 07/16/23. Judith did well with exercise. Lonya has not returned to exercise since her first day. Evette says she is in the process of moving and plans to return.              Comments: See ITP Comments

## 2023-08-01 ENCOUNTER — Encounter (HOSPITAL_COMMUNITY): Admission: RE | Admit: 2023-08-01 | Payer: 59 | Source: Ambulatory Visit

## 2023-08-03 ENCOUNTER — Encounter (HOSPITAL_COMMUNITY): Payer: 59

## 2023-08-06 ENCOUNTER — Encounter (HOSPITAL_COMMUNITY): Payer: 59

## 2023-08-06 ENCOUNTER — Other Ambulatory Visit (HOSPITAL_COMMUNITY): Payer: Self-pay

## 2023-08-07 ENCOUNTER — Other Ambulatory Visit (HOSPITAL_COMMUNITY): Payer: Self-pay

## 2023-08-07 NOTE — Telephone Encounter (Signed)
Called pt to let her know as of right now there is not an update. Will get message to appropriate staff to receive the update. She verbalized understanding.

## 2023-08-07 NOTE — Telephone Encounter (Signed)
Refaxed information for verification to insurance- pending

## 2023-08-07 NOTE — Telephone Encounter (Signed)
Patient is following up requesting updates on PA. Please advise.

## 2023-08-08 ENCOUNTER — Encounter (HOSPITAL_COMMUNITY): Payer: 59

## 2023-08-10 ENCOUNTER — Other Ambulatory Visit (HOSPITAL_COMMUNITY): Payer: Self-pay

## 2023-08-10 ENCOUNTER — Encounter (HOSPITAL_COMMUNITY): Payer: 59

## 2023-08-13 ENCOUNTER — Telehealth (HOSPITAL_COMMUNITY): Payer: Self-pay | Admitting: *Deleted

## 2023-08-13 ENCOUNTER — Other Ambulatory Visit (HOSPITAL_COMMUNITY): Payer: Self-pay

## 2023-08-13 ENCOUNTER — Encounter (HOSPITAL_COMMUNITY): Payer: 59

## 2023-08-13 NOTE — Telephone Encounter (Signed)
Spoke with [name]. Will discharge due to nonattendance. Aljean attended 3 exercise and education classes including orientation.Thayer Headings RN BSN

## 2023-08-14 ENCOUNTER — Other Ambulatory Visit (HOSPITAL_COMMUNITY): Payer: Self-pay

## 2023-08-15 ENCOUNTER — Encounter (HOSPITAL_COMMUNITY): Payer: 59

## 2023-08-16 ENCOUNTER — Other Ambulatory Visit (HOSPITAL_COMMUNITY): Payer: Self-pay

## 2023-08-16 NOTE — Telephone Encounter (Signed)
Pharmacy Patient Advocate Encounter  Received notification from [facility] that Prior Authorization for Reginal Lutes has been APPROVED from 07/27/23 to 06/25/24. Ran test claim, Copay is $ . This test claim was processed through [facility]- copay amounts may vary at other pharmacies due to pharmacy/plan contracts, or as the patient moves through the different stages of their insurance plan.   PA #/Case ID/Reference #: [id number]

## 2023-08-17 ENCOUNTER — Encounter (HOSPITAL_COMMUNITY): Payer: 59

## 2023-08-17 ENCOUNTER — Other Ambulatory Visit (HOSPITAL_COMMUNITY): Payer: Self-pay

## 2023-08-17 MED ORDER — WEGOVY 0.25 MG/0.5ML ~~LOC~~ SOAJ: 0.2500 mg | SUBCUTANEOUS | 0 refills | Status: DC

## 2023-08-20 ENCOUNTER — Encounter (HOSPITAL_COMMUNITY): Payer: 59

## 2023-08-22 ENCOUNTER — Encounter (HOSPITAL_COMMUNITY): Payer: 59

## 2023-08-24 ENCOUNTER — Encounter (HOSPITAL_COMMUNITY): Payer: 59

## 2023-08-27 ENCOUNTER — Encounter (HOSPITAL_COMMUNITY): Payer: 59

## 2023-08-29 ENCOUNTER — Encounter (HOSPITAL_COMMUNITY): Payer: 59

## 2023-08-31 ENCOUNTER — Encounter (HOSPITAL_COMMUNITY): Payer: 59

## 2023-09-03 ENCOUNTER — Encounter (HOSPITAL_COMMUNITY): Payer: 59

## 2023-09-05 ENCOUNTER — Encounter (HOSPITAL_COMMUNITY): Payer: 59

## 2023-09-07 ENCOUNTER — Encounter (HOSPITAL_COMMUNITY): Payer: 59

## 2023-09-10 ENCOUNTER — Encounter (HOSPITAL_COMMUNITY): Payer: 59

## 2023-09-12 ENCOUNTER — Encounter (HOSPITAL_COMMUNITY): Payer: 59

## 2023-09-14 ENCOUNTER — Encounter (HOSPITAL_COMMUNITY): Payer: 59

## 2023-09-17 ENCOUNTER — Encounter (HOSPITAL_COMMUNITY): Payer: 59

## 2023-09-19 ENCOUNTER — Encounter (HOSPITAL_COMMUNITY): Payer: 59

## 2023-09-21 ENCOUNTER — Encounter (HOSPITAL_COMMUNITY): Payer: 59

## 2023-09-24 ENCOUNTER — Encounter (HOSPITAL_COMMUNITY): Payer: 59

## 2023-09-25 NOTE — Progress Notes (Deleted)
 Cardiology Office Note:    Date:  09/25/2023   ID:  [name], DOB July 16, 1970, MRN [id number]  PCP:  Dawn Oaks, NP    [facility]Care Providers Cardiologist:  Dawn Lemma, MD { Click to update primary MD,subspecialty MD or APP then REFRESH:1}    Referring MD: Dawn Oaks, NP   No chief complaint on file. ***  History of Present Illness:    [name] is a 53 y.o. female with a hx of CAD, major depressive disorder, anxiety disorder, polysubstance abuse with cocaine use currently greater than 18 months sober, hypertension, help edema.  She was hospitalized 04/05/2023 with NSTEMI.  LHC showed 99% stenosis of the mid LAD treated with 2 overlapping DES.  She has 65% stenosis in the proximal LAD and 90% stenosis in the D1.  Significant collateral flow was noted.        Past Medical History:  Diagnosis Date   Acid reflux    Anxiety    Arthritis    Asthma    Depression    Diabetes (HCC)    Drug abuse (HCC)    ETOH abuse    High cholesterol    Hypertension    Manic-depressive disorder (HCC)    PTSD (post-traumatic stress disorder)    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    Past Surgical History:  Procedure Laterality Date   CORONARY IMAGING/OCT N/A 04/05/2023   Procedure: CORONARY IMAGING/OCT;  Surgeon: Dawn Pyo, MD;  Location: [facility];  Service: Cardiovascular;  Laterality: N/A;   CORONARY PRESSURE/FFR STUDY N/A 04/05/2023   Procedure: CORONARY PRESSURE/FFR STUDY;  Surgeon: Dawn Pyo, MD;  Location: [facility];  Service: Cardiovascular;  Laterality: N/A;   CORONARY STENT INTERVENTION N/A 04/05/2023   Procedure: CORONARY STENT INTERVENTION;  Surgeon: Dawn Pyo, MD;  Location: [facility];  Service: Cardiovascular;  Laterality: N/A;   LEFT HEART CATH AND CORONARY ANGIOGRAPHY N/A 04/05/2023   Procedure: LEFT HEART CATH AND CORONARY ANGIOGRAPHY;  Surgeon: Dawn Pyo, MD;  Location: [facility];   Service: Cardiovascular;  Laterality: N/A;   TUBAL LIGATION      Current Medications: No outpatient medications have been marked as taking for the 10/08/23 encounter (Appointment) with Dawn Duster, PA.     Allergies:   Benadryl [diphenhydramine], Penicillins, and Latex   Social History   Socioeconomic History   Marital status: Divorced    Spouse name: Not on file   Number of children: Not on file   Years of education: Not on file   Highest education level: Not on file  Occupational History   Not on file  Tobacco Use   Smoking status: Every Day    Current packs/day: 0.50    Average packs/day: 0.5 packs/day for 29.0 years (14.5 ttl pk-yrs)    Types: Cigarettes   Smokeless tobacco: Never   Tobacco comments:    in rehab  Vaping Use   Vaping status: Never Used  Substance and Sexual Activity   Alcohol use: Yes   Drug use: Yes    Types: Cocaine, Marijuana    Comment: crack   Sexual activity: Yes    Birth control/protection: None  Other Topics Concern   Not on file  Social History Narrative   Not on file   Social Drivers of Health   Financial Resource Strain: Not on file  Food Insecurity: Not on file  Transportation Needs: Not on file  Physical Activity:  Not on file  Stress: Not on file  Social Connections: Not on file     Family History: The patient's ***family history includes Diabetes in her mother; Heart disease in her maternal grandmother; Hypertension in her mother.  ROS:   Please see the history of present illness.    *** All other systems reviewed and are negative.  EKGs/Labs/Other Studies Reviewed:    The following studies were reviewed today: ***      Recent Labs: 06/04/2023: ALT 41; BUN 11; Creatinine, Ser 0.56; Hemoglobin 12.5; Platelets 309; Potassium 3.5; Sodium 137  Recent Lipid Panel    Component Value Date/Time   CHOL 108 04/05/2023 0341   TRIG 115 04/05/2023 0341   HDL 55 04/05/2023 0341   CHOLHDL 2.0 04/05/2023 0341   VLDL 23  04/05/2023 0341   LDLCALC 30 04/05/2023 0341     Risk Assessment/Calculations:   {Does this patient have ATRIAL FIBRILLATION?:[id number]}  No BP recorded.  {Refresh Note OR Click here to enter BP  :1}***         Physical Exam:    VS:  There were no vitals taken for this visit.    Wt Readings from Last 3 Encounters:  07/27/23 191 lb 6.4 oz (86.8 kg)  07/10/23 195 lb 8.8 oz (88.7 kg)  06/29/23 198 lb 6.4 oz (90 kg)     GEN: *** Well nourished, well developed in no acute distress HEENT: Normal NECK: No JVD; No carotid bruits LYMPHATICS: No lymphadenopathy CARDIAC: ***RRR, no murmurs, rubs, gallops RESPIRATORY:  Clear to auscultation without rales, wheezing or rhonchi  ABDOMEN: Soft, non-tender, non-distended MUSCULOSKELETAL:  No edema; No deformity  SKIN: Warm and dry NEUROLOGIC:  Alert and oriented x 3 PSYCHIATRIC:  Normal affect   ASSESSMENT:    No diagnosis found. PLAN:    In order of problems listed above:  ***      {Are you ordering a CV Procedure (e.g. stress test, cath, DCCV, TEE, etc)?   Press F2        :[id number]}    Medication Adjustments/Labs and Tests Ordered: Current medicines are reviewed at length with the patient today.  Concerns regarding medicines are outlined above.  No orders of the defined types were placed in this encounter.  No orders of the defined types were placed in this encounter.   There are no Patient Instructions on file for this visit.   Signed, Dawn Duster, PA  09/25/2023 4:08 PM    [facility]

## 2023-09-26 ENCOUNTER — Encounter (HOSPITAL_COMMUNITY): Payer: 59

## 2023-09-28 ENCOUNTER — Encounter (HOSPITAL_COMMUNITY): Payer: 59

## 2023-10-01 ENCOUNTER — Encounter (HOSPITAL_COMMUNITY): Payer: 59

## 2023-10-03 ENCOUNTER — Encounter (HOSPITAL_COMMUNITY): Payer: 59

## 2023-10-05 ENCOUNTER — Encounter (HOSPITAL_BASED_OUTPATIENT_CLINIC_OR_DEPARTMENT_OTHER): Payer: Self-pay

## 2023-10-08 ENCOUNTER — Ambulatory Visit: Payer: 59 | Attending: Physician Assistant | Admitting: Physician Assistant

## 2024-01-27 NOTE — Progress Notes (Unsigned)
 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    History of Present Illness  01/31/2024  Pulmonary/ 1st office eval/[name]  advair  Chief Complaint  Patient presents with   Consult    Recent dx of emphysema, informed 77mo ago.   Dyspnea:  bending over/ steps fast / still walks fast  Cough: worse at hs / min mucoid  15 min each am  Sleep: flat bed  with 2 pillows  SABA use: hfa/ neb  02 ldz:wnwz  LDSCT:ordered CT today   No obvious day to day or daytime pattern/variability or assoc excess/ purulent sputum or mucus plugs or hemoptysis or cp or chest tightness, subjective wheeze or overt sinus or hb symptoms.    Also denies any obvious fluctuation of symptoms with weather or environmental changes or other aggravating or alleviating factors except as outlined above   No unusual exposure hx or h/o childhood pna or knowledge of premature birth.  Current Allergies, Complete Past Medical History, Past Surgical History, Family History, and Social History were reviewed in [employer] record.  ROS  The following are not active complaints unless bolded Hoarseness, sore throat, dysphagia, dental problems, itching, sneezing,  nasal congestion or discharge of excess mucus or purulent secretions, ear ache,   fever, chills, sweats, unintended wt loss or wt gain, classically pleuritic or exertional cp,  orthopnea pnd or arm/hand swelling  or leg swelling, presyncope, palpitations, abdominal pain, anorexia, nausea, vomiting, diarrhea  or change in bowel habits or change in bladder habits, change in stools or change in urine, dysuria, hematuria,  rash, arthralgias, visual complaints, headache, numbness, weakness or ataxia or problems with walking or coordination,  change  in mood or  memory.             Outpatient Medications Prior to Visit  Medication Sig Dispense Refill   acetaminophen  (TYLENOL ) 500 MG tablet Take 1 tablet (500 mg total) by mouth in the morning, at noon, in the evening, and at bedtime. 28 tablet 0   ADVAIR HFA 115-21 MCG/ACT inhaler Inhale 2 puffs into the lungs 2 (two) times daily.     albuterol  (ACCUNEB ) 1.25 MG/3ML nebulizer solution Take 1 ampule by nebulization every 6 (six) hours as needed for wheezing.     albuterol  (VENTOLIN  HFA) 108 (90 Base) MCG/ACT inhaler Inhale 1-2 puffs into the lungs every 6 (six) hours as needed for wheezing or shortness of breath. 18 g 0   amLODipine  (NORVASC ) 5 MG tablet Take 5 mg by mouth daily.     aspirin  EC 81 MG tablet Take 81 mg by mouth daily. Swallow whole.     atorvastatin  (LIPITOR) 80 MG tablet Take 1 tablet (80 mg total) by mouth daily. 90 tablet 3   escitalopram  (LEXAPRO ) 10 MG tablet Take 10 mg by mouth daily.     gabapentin  (NEURONTIN ) 600 MG tablet Take 600 mg by mouth 3 (three) times daily.     lurasidone  (LATUDA ) 40 MG TABS tablet Take 40 mg by mouth every evening.     metoprolol  succinate (TOPROL -XL) 25 MG 24 hr tablet Take 1 tablet (25 mg total) by mouth daily. 90 tablet 3   montelukast  (SINGULAIR ) 10 MG tablet  Take 10 mg by mouth daily.     nitroGLYCERIN  (NITROSTAT ) 0.4 MG SL tablet Place 1 tablet (0.4 mg total) under the tongue every 5 (five) minutes as needed. 25 tablet 3   omeprazole (PRILOSEC) 20 MG capsule Take 20 mg by mouth daily.     ondansetron  (ZOFRAN ) 4 MG tablet Take 1 tablet (4 mg total) by mouth every 6 (six) hours as needed for nausea or vomiting. 30 tablet 1   ondansetron  (ZOFRAN -ODT) 4 MG disintegrating tablet Take 1 tablet (4 mg total) by mouth every 8 (eight) hours as needed for nausea or vomiting. 20 tablet 0   OXcarbazepine  (TRILEPTAL ) 150 MG tablet Take 150 mg by mouth 2 (two) times daily.     rosuvastatin (CRESTOR) 40 MG tablet Take 40 mg by mouth daily.      Semaglutide -Weight Management (WEGOVY ) 0.25 MG/0.5ML SOAJ Inject 0.25 mg into the skin once a week. (Patient not taking: Reported on 01/31/2024) 2 mL 0   sertraline  (ZOLOFT ) 25 MG tablet Take 25 mg by mouth 2 (two) times daily.     ticagrelor  (BRILINTA ) 90 MG TABS tablet Take 1 tablet (90 mg total) by mouth 2 (two) times daily. 180 tablet 2   traZODone  (DESYREL ) 100 MG tablet Take 100 mg by mouth at bedtime.     triamcinolone ointment (KENALOG) 0.1 % Apply topically 2 (two) times daily.     Varenicline  Tartrate, Starter, 0.5 MG X 11 & 1 MG X 42 TBPK Take 0.5 mg x 11 & 1x 42 po TBPK 42 each 0   fluticasone  (FLONASE ) 50 MCG/ACT nasal spray Place 1 spray into both nostrils daily. (Patient not taking: Reported on 01/31/2024)     lidocaine  (LIDODERM ) 5 % Place 1 patch onto the skin daily. Remove & Discard patch within 12 hours or as directed by MD (Patient not taking: Reported on 01/31/2024) 10 patch 0   methocarbamol  (ROBAXIN ) 500 MG tablet Take 1 tablet (500 mg total) by mouth 2 (two) times daily. (Patient not taking: Reported on 01/31/2024) 20 tablet 0   QUEtiapine (SEROQUEL) 100 MG tablet Take 100 mg by mouth at bedtime. (Patient not taking: Reported on 01/31/2024)     trospium  (SANCTURA ) 20 MG tablet TAKE 1 TABLET(20 MG) BY MOUTH TWICE DAILY (Patient not taking: Reported on 01/31/2024) 60 tablet 2   No facility-administered medications prior to visit.    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      Objective:     BP 122/78   Pulse 89   Temp 97.9 F (36.6 C)   Ht 5' 3 (1.6 m)   Wt 182 lb 12.8 oz (82.9 kg)   SpO2 97% Comment: RA  BMI 32.38 kg/m   SpO2: 97 % (RA)  Pleasant somewhat  hyperkinetic amb bf nad    HEENT : Oropharynx  clear/ top dentures/ lower plate      Nasal turbinates  nl    NECK :  without  apparent JVD/ palpable Nodes/TM    LUNGS: no acc muscle use,  Nl contour chest which is clear  to A and P bilaterally without cough on insp or exp maneuvers   CV:  RRR  no s3 or murmur or increase in P2, and no edema   ABD:  soft and nontender   MS:  Gait nl   ext warm without deformities Or obvious joint restrictions  calf tenderness, cyanosis or clubbing    SKIN: warm and dry without lesions    NEURO:  alert, approp, nl sensorium with  no motor or cerebellar deficits apparent.       Assessment     Assessment & Plan DOE (dyspnea on exertion) Active smoker with h/o childhood asthma and onset doe 2024  - CTa 04/04/23 done for doe with mild apical emphysema - 01/31/2024   Walked on RA  x  3  lap(s) =  approx 750  ft  @ fast pace, stopped due to end of study  with lowest 02 sats 97% and min sob last lap  - PFTs 01/31/2024 >>>  I don't think she has much copd but more of an AB pattern with inconsistent use of maint rx and over reliance on sabal  .Re SABA :  I spent extra time with pt today reviewing appropriate use of albuterol  for prn use on exertion with the following points: 1) saba is for relief of sob that does not improve by walking a slower pace or resting but rather if the pt does not improve after trying this first. 2) If the pt is convinced, as many are, that saba helps recover from activity faster then it's easy to tell if this is the case by re-challenging : ie stop, take the inhaler, then p 5 minutes try the exact same activity (intensity of workload) that just caused the symptoms and see if they are substantially diminished or not after saba 3) if there is an activity that reproducibly causes the symptoms, try the saba 15 min before the activity on alternate days   If in fact the saba really does help, then fine to continue to use it prn but advised  may need to look closer at the maintenance regimen (ok to try off advair for a week vs full dosing if worse)  being used to achieve better control of airways disease with exertion.    >>> f/u with pfts in 8 weeks - call sooner if needed   >>>   The proper method of use, as well as anticipated side effects, of a metered-dose (HFA) inhaler were discussed and demonstrated to the patient using teach back method.     Cigarette smoker Counseled re importance of smoking cessation but did not meet time criteria for separate billing     Solitary pulmonary nodule on lung CT See CT chest 03/2023 rec 6-12 m f/u - CT chest ordered 01/31/2024   CT results reviewed with pt >>> Too small for PET or bx, not suspicious enough for excisional bx > really only option for now is follow the Fleischner society guidelines as rec by radiology.   >>>  CT chest s contrast then needs to be lined up for LDSCT x  15 years min/ advised   Discussed in detail all the  indications, usual  risks and alternatives  relative to the benefits with patient who agrees to proceed with w/u as outlined.      Each maintenance medication was reviewed in detail including emphasizing most importantly the difference between maintenance and prns and under what circumstances the prns are to be triggered using an  action plan format where appropriate.  Total time for H and P, chart review, counseling, reviewing hfa device(s) , directly observing portions of ambulatory 02 saturation study/ and generating customized AVS unique to this office visit / same day charting = 46 min             Patient Instructions  Plan A = Automatic = Always=    Breathe clean air  or take advair regularly   Plan B = Backup (to supplement plan A, not to replace it) Use your albuterol  inhaler as a rescue medication to be used if you can't catch your breath by resting or slowing your pace  or doing a relaxed purse lip breathing pattern.  - The less you use it,  the better it will work when you need it. - Ok to use the inhaler up to 2 puffs  every 4 hours if you must but call for appointment if use goes up over your usual need - Don't leave home without it !!  (think of it like the spare tire or starter fluid for your car)   Plan C = Crisis (instead of Plan B but only if Plan B stops working) - only use your albuterol  nebulizer if you first try Plan B and it fails to help > ok to use the nebulizer up to every 4 hours but if start needing it regularly call for immediate appointment    Also  Ok to try albuterol  15 min before an activity (on alternating days)  that you know would usually make you short of breath and see if it makes any difference and if makes none then don't take albuterol  after activity unless you can't catch your breath as this means it's the resting that helps, not the albuterol .      The key is to stop smoking completely before smoking completely stops you!   Nicotine  patches fine but stop all smoking  My office will be contacting you by phone for referral CT chest and PFTs - if you don't hear back from my office within one week please call us  back or notify us  thru MyChart and we'll address it right away.   Please schedule a follow up office visit in 8 weeks, call sooner if needed with all medications /inhalers/ solutions in hand so we can verify exactly what you are taking. This includes all medications from all doctors and over the counters       Ozell America, MD 01/31/2024

## 2024-01-30 ENCOUNTER — Ambulatory Visit
Admission: EM | Admit: 2024-01-30 | Discharge: 2024-01-30 | Disposition: A | Discharge status: Home / Self Care | Attending: Family Medicine | Admitting: Family Medicine

## 2024-01-30 DIAGNOSIS — R112 Nausea with vomiting, unspecified: Secondary | ICD-10-CM

## 2024-01-30 DIAGNOSIS — K529 Noninfective gastroenteritis and colitis, unspecified: Secondary | ICD-10-CM

## 2024-01-30 DIAGNOSIS — R140 Abdominal distension (gaseous): Secondary | ICD-10-CM | POA: Diagnosis not present

## 2024-01-30 HISTORY — DX: Chronic obstructive pulmonary disease, unspecified: J44.9

## 2024-01-30 MED ORDER — ONDANSETRON 4 MG PO TBDP
4.0000 mg | ORAL_TABLET | Freq: Three times a day (TID) | ORAL | 0 refills | Status: AC | PRN
Start: 2024-01-30 — End: ?

## 2024-01-30 NOTE — ED Provider Notes (Signed)
 [facility]  Note:  This document was prepared using Conservation officer, historic buildings and may include unintentional dictation errors.  MRN: [id number] DOB: [birth year]  Subjective:   [name] is a 53 y.o. female presenting for 3 day history of nausea and vomiting, abdominal bloating, lightheadedness. This was the day after eating takeout. Took a laxative 2 days ago because she was constipated. Had multiple bowel movements that day but continues to have nausea and vomiting. Has an appetite but cannot hold down solid foods. Has concerns about her heart because she has been lightheaded these past few days. Has a history of 2 stents. No alcohol use, no drug use.   No current facility-administered medications for this encounter.  Current Outpatient Medications:    Semaglutide -Weight Management (WEGOVY ) 0.25 MG/0.5ML SOAJ, Inject 0.25 mg into the skin once a week., Disp: 2 mL, Rfl: 0   acetaminophen  (TYLENOL ) 500 MG tablet, Take 1 tablet (500 mg total) by mouth in the morning, at noon, in the evening, and at bedtime., Disp: 28 tablet, Rfl: 0   ADVAIR HFA 115-21 MCG/ACT inhaler, Inhale 2 puffs into the lungs 2 (two) times daily., Disp: , Rfl:    albuterol  (VENTOLIN  HFA) 108 (90 Base) MCG/ACT inhaler, Inhale 1-2 puffs into the lungs every 6 (six) hours as needed for wheezing or shortness of breath., Disp: 18 g, Rfl: 0   amLODipine  (NORVASC ) 5 MG tablet, Take 5 mg by mouth daily., Disp: , Rfl:    aspirin  EC 81 MG tablet, Take 81 mg by mouth daily. Swallow whole., Disp: , Rfl:    atorvastatin  (LIPITOR) 80 MG tablet, Take 1 tablet (80 mg total) by mouth daily., Disp: 90 tablet, Rfl: 3   escitalopram  (LEXAPRO ) 10 MG tablet, Take 10 mg by mouth daily., Disp: , Rfl:    fluticasone  (FLONASE ) 50 MCG/ACT nasal spray, Place 1 spray into both nostrils daily., Disp: , Rfl:    gabapentin  (NEURONTIN ) 600 MG tablet, Take 600 mg by mouth 3 (three) times daily., Disp: , Rfl:     lidocaine  (LIDODERM ) 5 %, Place 1 patch onto the skin daily. Remove & Discard patch within 12 hours or as directed by MD, Disp: 10 patch, Rfl: 0   lurasidone  (LATUDA ) 40 MG TABS tablet, Take 40 mg by mouth every evening., Disp: , Rfl:    methocarbamol  (ROBAXIN ) 500 MG tablet, Take 1 tablet (500 mg total) by mouth 2 (two) times daily. (Patient taking differently: Take 500 mg by mouth 2 (two) times daily. As needed for sciatic nerve), Disp: 20 tablet, Rfl: 0   metoprolol  succinate (TOPROL -XL) 25 MG 24 hr tablet, Take 1 tablet (25 mg total) by mouth daily., Disp: 90 tablet, Rfl: 3   montelukast  (SINGULAIR ) 10 MG tablet, Take 10 mg by mouth daily. (Patient not taking: Reported on 07/10/2023), Disp: , Rfl:    nitroGLYCERIN  (NITROSTAT ) 0.4 MG SL tablet, Place 1 tablet (0.4 mg total) under the tongue every 5 (five) minutes as needed., Disp: 25 tablet, Rfl: 3   omeprazole (PRILOSEC) 20 MG capsule, Take 20 mg by mouth daily., Disp: , Rfl:    ondansetron  (ZOFRAN ) 4 MG tablet, Take 1 tablet (4 mg total) by mouth every 6 (six) hours as needed for nausea or vomiting., Disp: 30 tablet, Rfl: 1   ondansetron  (ZOFRAN -ODT) 4 MG disintegrating tablet, 4mg  ODT q4 hours prn nausea/vomit, Disp: 12 tablet, Rfl: 0   OXcarbazepine  (TRILEPTAL ) 150 MG tablet, Take 150 mg by mouth 2 (two) times daily.,  Disp: , Rfl:    QUEtiapine (SEROQUEL) 100 MG tablet, Take 100 mg by mouth at bedtime., Disp: , Rfl:    rosuvastatin (CRESTOR) 40 MG tablet, Take 40 mg by mouth daily. (Patient not taking: Reported on 07/27/2023), Disp: , Rfl:    sertraline  (ZOLOFT ) 25 MG tablet, Take 25 mg by mouth 2 (two) times daily., Disp: , Rfl:    ticagrelor  (BRILINTA ) 90 MG TABS tablet, Take 1 tablet (90 mg total) by mouth 2 (two) times daily., Disp: 180 tablet, Rfl: 2   traZODone  (DESYREL ) 100 MG tablet, Take 100 mg by mouth at bedtime., Disp: , Rfl:    triamcinolone ointment (KENALOG) 0.1 %, Apply topically 2 (two) times daily., Disp: , Rfl:    trospium   (SANCTURA ) 20 MG tablet, TAKE 1 TABLET(20 MG) BY MOUTH TWICE DAILY, Disp: 60 tablet, Rfl: 2   Varenicline  Tartrate, Starter, 0.5 MG X 11 & 1 MG X 42 TBPK, Take 0.5 mg x 11 & 1x 42 po TBPK, Disp: 42 each, Rfl: 0   Allergies  Allergen Reactions   Benadryl [Diphenhydramine]     heart races   Penicillins Nausea And Vomiting   Latex Rash    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     Past Surgical History:  Procedure Laterality Date   CORONARY IMAGING/OCT N/A 04/05/2023   Procedure: CORONARY IMAGING/OCT;  Surgeon: Thukkani, Arun K, MD;  Location: [facility];  Service: Cardiovascular;  Laterality: N/A;   CORONARY PRESSURE/FFR STUDY N/A 04/05/2023   Procedure: CORONARY PRESSURE/FFR STUDY;  Surgeon: Wendel Lurena POUR, MD;  Location: [facility];  Service: Cardiovascular;  Laterality: N/A;   CORONARY STENT INTERVENTION N/A 04/05/2023   Procedure: CORONARY STENT INTERVENTION;  Surgeon: Wendel Lurena POUR, MD;  Location: [facility];  Service: Cardiovascular;  Laterality: N/A;   LEFT HEART CATH AND CORONARY ANGIOGRAPHY N/A 04/05/2023   Procedure: LEFT HEART CATH AND CORONARY ANGIOGRAPHY;  Surgeon: Wendel Lurena POUR, MD;  Location: [facility];  Service: Cardiovascular;  Laterality: N/A;   TUBAL LIGATION      Family History  Problem Relation Age of Onset   Diabetes Mother    Hypertension Mother    Heart disease Maternal Grandmother     Social History   Tobacco Use   Smoking status: Every Day    Current packs/day: 0.50    Average packs/day: 0.5 packs/day for 29.0 years (14.5 ttl pk-yrs)    Types: Cigarettes   Smokeless tobacco: Never   Tobacco  comments:    in rehab  Vaping Use   Vaping status: Never Used  Substance Use Topics   Alcohol use: Not Currently   Drug use: Not Currently    Types: Cocaine, Marijuana    Comment: crack    ROS   Objective:   Vitals: BP (!) 144/89 (BP Location: Right Arm)   Pulse 84   Temp 98.8 F (37.1 C) (Oral)   Resp 20   SpO2 97%   Physical Exam Constitutional:      General: She is not  in acute distress.    Appearance: Normal appearance. She is well-developed. She is not ill-appearing, toxic-appearing or diaphoretic.  HENT:     Head: Normocephalic and atraumatic.     Nose: Nose normal.     Mouth/Throat:     Mouth: Mucous membranes are moist.     Pharynx: Oropharynx is clear.  Eyes:     General: No scleral icterus.       Right eye: No discharge.        Left eye: No discharge.     Extraocular Movements: Extraocular movements intact.     Conjunctiva/sclera: Conjunctivae normal.  Cardiovascular:     Rate and Rhythm: Normal rate.  Pulmonary:     Effort: Pulmonary effort is normal.  Abdominal:     General: Bowel sounds are normal. There is no distension.     Palpations: Abdomen is soft. There is no mass.     Tenderness: There is no abdominal tenderness. There is no right CVA tenderness, left CVA tenderness, guarding or rebound.  Skin:    General: Skin is warm and dry.  Neurological:     General: No focal deficit present.     Mental Status: She is alert and oriented to person, place, and time.  Psychiatric:        Mood and Affect: Mood normal.        Behavior: Behavior normal.        Thought Content: Thought content normal.        Judgment: Judgment normal.     No results found for this or any previous visit (from the past 24 hours).  Assessment and Plan :   PDMP not reviewed this encounter.  No diagnosis found.

## 2024-01-30 NOTE — ED Triage Notes (Signed)
 Pt c/o abd bloating, gas, n/v, lightheaded started 8/3 pm after eating chinese food-denies diarrhea-states she took a laxative with +BM-pt NAD-steady gait

## 2024-01-30 NOTE — Discharge Instructions (Signed)
Make sure you push fluids drinking mostly water but mix it with Gatorade.  Try to eat light meals including soups, broths and soft foods, fruits.  You may use Zofran for your nausea and vomiting once every 8 hours.   Please return to the clinic if symptoms worsen or you start having severe abdominal pain not helped by taking Tylenol or start having bloody stools or blood in the vomit.  

## 2024-01-31 ENCOUNTER — Ambulatory Visit: Admitting: Internal Medicine

## 2024-01-31 ENCOUNTER — Encounter: Payer: Self-pay | Admitting: Internal Medicine

## 2024-01-31 VITALS — BP 122/78 | HR 89 | Temp 97.9°F | Ht 63.0 in | Wt 182.8 lb

## 2024-01-31 DIAGNOSIS — R911 Solitary pulmonary nodule: Secondary | ICD-10-CM

## 2024-01-31 DIAGNOSIS — F17210 Nicotine dependence, cigarettes, uncomplicated: Secondary | ICD-10-CM

## 2024-01-31 DIAGNOSIS — R0609 Other forms of dyspnea: Secondary | ICD-10-CM

## 2024-01-31 MED ORDER — ALBUTEROL SULFATE HFA 108 (90 BASE) MCG/ACT IN AERS: 2.0000 | INHALATION_SPRAY | Freq: Four times a day (QID) | RESPIRATORY_TRACT | 6 refills | Status: AC | PRN

## 2024-01-31 MED ORDER — FLUTICASONE-SALMETEROL 115-21 MCG/ACT IN AERO: 2.0000 | INHALATION_SPRAY | Freq: Two times a day (BID) | RESPIRATORY_TRACT | 6 refills | Status: DC

## 2024-01-31 MED ORDER — ALBUTEROL SULFATE (2.5 MG/3ML) 0.083% IN NEBU: 2.5000 mg | INHALATION_SOLUTION | Freq: Four times a day (QID) | RESPIRATORY_TRACT | 3 refills | Status: AC | PRN

## 2024-01-31 NOTE — Assessment & Plan Note (Addendum)
 Active smoker with h/o childhood asthma and onset doe 2024  - CTa 04/04/23 done for doe with mild apical emphysema - 01/31/2024   Walked on RA  x  3  lap(s) =  approx 750  ft  @ fast pace, stopped due to end of study  with lowest 02 sats 97% and min sob last lap  - PFTs 01/31/2024 >>>  I don't think she has much copd but more of an AB pattern with inconsistent use of maint rx and over reliance on sabal  .Re SABA :  I spent extra time with pt today reviewing appropriate use of albuterol  for prn use on exertion with the following points: 1) saba is for relief of sob that does not improve by walking a slower pace or resting but rather if the pt does not improve after trying this first. 2) If the pt is convinced, as many are, that saba helps recover from activity faster then it's easy to tell if this is the case by re-challenging : ie stop, take the inhaler, then p 5 minutes try the exact same activity (intensity of workload) that just caused the symptoms and see if they are substantially diminished or not after saba 3) if there is an activity that reproducibly causes the symptoms, try the saba 15 min before the activity on alternate days   If in fact the saba really does help, then fine to continue to use it prn but advised may need to look closer at the maintenance regimen (ok to try off advair for a week vs full dosing if worse)  being used to achieve better control of airways disease with exertion.    >>> f/u with pfts in 8 weeks - call sooner if needed   >>>   The proper method of use, as well as anticipated side effects, of a metered-dose (HFA) inhaler were discussed and demonstrated to the patient using teach back method.

## 2024-01-31 NOTE — Addendum Note (Signed)
 Addended by: ROLANDA POWELL SAILOR on: 01/31/2024 09:48 AM   Modules accepted: Orders

## 2024-01-31 NOTE — Assessment & Plan Note (Addendum)
 Counseled re importance of smoking cessation but did not meet time criteria for separate billing

## 2024-01-31 NOTE — Assessment & Plan Note (Addendum)
 See CT chest 03/2023 rec 6-12 m f/u - CT chest ordered 01/31/2024   CT results reviewed with pt >>> Too small for PET or bx, not suspicious enough for excisional bx > really only option for now is follow the Fleischner society guidelines as rec by radiology.   >>>  CT chest s contrast then needs to be lined up for LDSCT x  15 years min/ advised   Discussed in detail all the  indications, usual  risks and alternatives  relative to the benefits with patient who agrees to proceed with w/u as outlined.      Each maintenance medication was reviewed in detail including emphasizing most importantly the difference between maintenance and prns and under what circumstances the prns are to be triggered using an action plan format where appropriate.  Total time for H and P, chart review, counseling, reviewing hfa device(s) , directly observing portions of ambulatory 02 saturation study/ and generating customized AVS unique to this office visit / same day charting = 46 min

## 2024-01-31 NOTE — Progress Notes (Signed)
 Spoke with patient, she states she does not have any Advair, albuterol  inhaler or albuterol  nebulizer solution.  She verified that she wanted the prescription sent to local [facility] pharmacy.  All of prescriptions were expired.  Prescriptions sent.  Nothing further needed.

## 2024-01-31 NOTE — Patient Instructions (Signed)
 Plan A = Automatic = Always=    Breathe clean air  or take advair regularly   Plan B = Backup (to supplement plan A, not to replace it) Use your albuterol  inhaler as a rescue medication to be used if you can't catch your breath by resting or slowing your pace  or doing a relaxed purse lip breathing pattern.  - The less you use it, the better it will work when you need it. - Ok to use the inhaler up to 2 puffs  every 4 hours if you must but call for appointment if use goes up over your usual need - Don't leave home without it !!  (think of it like the spare tire or starter fluid for your car)   Plan C = Crisis (instead of Plan B but only if Plan B stops working) - only use your albuterol  nebulizer if you first try Plan B and it fails to help > ok to use the nebulizer up to every 4 hours but if start needing it regularly call for immediate appointment    Also  Ok to try albuterol  15 min before an activity (on alternating days)  that you know would usually make you short of breath and see if it makes any difference and if makes none then don't take albuterol  after activity unless you can't catch your breath as this means it's the resting that helps, not the albuterol .      The key is to stop smoking completely before smoking completely stops you!   Nicotine  patches fine but stop all smoking  My office will be contacting you by phone for referral CT chest and PFTs - if you don't hear back from my office within one week please call us  back or notify us  thru MyChart and we'll address it right away.   Please schedule a follow up office visit in 8 weeks, call sooner if needed with all medications /inhalers/ solutions in hand so we can verify exactly what you are taking. This includes all medications from all doctors and over the counters

## 2024-02-01 ENCOUNTER — Other Ambulatory Visit: Payer: Self-pay

## 2024-02-01 MED ORDER — FLUTICASONE-SALMETEROL 115-21 MCG/ACT IN AERO: 2.0000 | INHALATION_SPRAY | Freq: Two times a day (BID) | RESPIRATORY_TRACT | 6 refills | Status: AC

## 2024-03-14 ENCOUNTER — Ambulatory Visit (HOSPITAL_COMMUNITY): Attending: Internal Medicine

## 2024-03-27 ENCOUNTER — Ambulatory Visit: Payer: 59

## 2024-03-28 ENCOUNTER — Ambulatory Visit: Admitting: Internal Medicine

## 2024-03-28 ENCOUNTER — Encounter: Payer: Self-pay | Admitting: Internal Medicine

## 2024-03-28 ENCOUNTER — Encounter

## 2024-03-28 NOTE — Progress Notes (Deleted)
 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    History of Present Illness  01/31/2024  Pulmonary/ 1st office eval/Lennis Rader  advair  Chief Complaint  Patient presents with   Consult    Recent dx of emphysema, informed 9mo ago.   Dyspnea:  bending over/ steps fast / still walks fast  Cough: worse at hs / min mucoid  15 min each am  Sleep: flat bed  with 2 pillows  SABA use: hfa/ neb  02 ldz:wnwz  LDSCT:ordered CT today  Rec Plan A = Automatic = Always=    Breathe clean air  or take advair regularly  Plan B = Backup (to supplement plan A, not to replace it) Use your albuterol  inhaler as a rescue medication Plan C = Crisis (instead of Plan B but only if Plan B stops working) - only use your albuterol  nebulizer if you first try Plan B  Also  Ok to try albuterol  15 min before an activity (on alternating days)  that you know would usually make you short of breath    The key is to stop smoking completely before smoking completely stops you!  Nicotine  patches fine but stop all smoking My office will be contacting you by phone for referral CT chest and PFTs   Please schedule a follow up office visit in 8 weeks, call sooner if needed with all medications /inhalers/ solutions in hand     03/28/2024  f/u ov/[name] re: ***   maint on ***  No chief complaint on file.   Dyspnea:  *** Cough: *** Sleeping: *** resp cc  SABA use: *** 02: ***  Lung cancer screening :  ***    No obvious day to day or daytime variability or assoc excess/ purulent sputum or mucus plugs or hemoptysis or cp or chest tightness, subjective wheeze or overt sinus or hb symptoms.    Also denies any obvious fluctuation of symptoms with weather or environmental changes or other aggravating or alleviating  factors except as outlined above   No unusual exposure hx or h/o childhood pna/ asthma or knowledge of premature birth.  Current Allergies, Complete Past Medical History, Past Surgical History, Family History, and Social History were reviewed in [employer] record.  ROS  The following are not active complaints unless bolded Hoarseness, sore throat, dysphagia, dental problems, itching, sneezing,  nasal congestion or discharge of excess mucus or purulent secretions, ear ache,   fever, chills, sweats, unintended wt loss or wt gain, classically pleuritic or exertional cp,  orthopnea pnd or arm/hand swelling  or leg swelling, presyncope, palpitations, abdominal pain, anorexia, nausea, vomiting, diarrhea  or change in bowel habits or change in bladder habits, change in stools or change in urine, dysuria, hematuria,  rash, arthralgias, visual complaints, headache, numbness, weakness or ataxia or problems with walking or coordination,  change in mood or  memory.        No outpatient medications have been marked as taking for the 03/28/24 encounter (Appointment) with Dawn Ozell KATHEE, MD.           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

## 2024-04-16 ENCOUNTER — Ambulatory Visit
Admission: RE | Admit: 2024-04-16 | Discharge: 2024-04-16 | Disposition: A | Discharge status: Home / Self Care | Source: Ambulatory Visit | Attending: Nurse Practitioner | Admitting: Nurse Practitioner

## 2024-04-16 DIAGNOSIS — Z1231 Encounter for screening mammogram for malignant neoplasm of breast: Secondary | ICD-10-CM

## 2024-06-12 ENCOUNTER — Ambulatory Visit (HOSPITAL_COMMUNITY)

## 2024-06-12 ENCOUNTER — Encounter (HOSPITAL_COMMUNITY): Payer: Self-pay | Admitting: Emergency Medicine

## 2024-06-12 ENCOUNTER — Ambulatory Visit (HOSPITAL_COMMUNITY)
Admission: EM | Admit: 2024-06-12 | Discharge: 2024-06-12 | Disposition: A | Discharge status: Home / Self Care | Attending: Family Medicine | Admitting: Family Medicine

## 2024-06-12 DIAGNOSIS — R140 Abdominal distension (gaseous): Secondary | ICD-10-CM | POA: Insufficient documentation

## 2024-06-12 DIAGNOSIS — R197 Diarrhea, unspecified: Secondary | ICD-10-CM | POA: Insufficient documentation

## 2024-06-12 DIAGNOSIS — R109 Unspecified abdominal pain: Secondary | ICD-10-CM | POA: Diagnosis present

## 2024-06-12 LAB — CBC WITH DIFFERENTIAL/PLATELET
Abs Immature Granulocytes: 0.04 K/uL (ref 0.00–0.07)
Basophils Absolute: 0 K/uL (ref 0.0–0.1)
Basophils Relative: 0 %
Eosinophils Absolute: 0.1 K/uL (ref 0.0–0.5)
Eosinophils Relative: 1 %
HCT: 37.3 % (ref 36.0–46.0)
Hemoglobin: 12.3 g/dL (ref 12.0–15.0)
Immature Granulocytes: 0 %
Lymphocytes Relative: 24 %
Lymphs Abs: 2.2 K/uL (ref 0.7–4.0)
MCH: 28.4 pg (ref 26.0–34.0)
MCHC: 33 g/dL (ref 30.0–36.0)
MCV: 86.1 fL (ref 80.0–100.0)
Monocytes Absolute: 0.6 K/uL (ref 0.1–1.0)
Monocytes Relative: 6 %
Neutro Abs: 6.3 K/uL (ref 1.7–7.7)
Neutrophils Relative %: 69 %
Platelets: 264 K/uL (ref 150–400)
RBC: 4.33 MIL/uL (ref 3.87–5.11)
RDW: 15.9 % — ABNORMAL HIGH (ref 11.5–15.5)
WBC: 9.2 K/uL (ref 4.0–10.5)
nRBC: 0 % (ref 0.0–0.2)

## 2024-06-12 LAB — COMPREHENSIVE METABOLIC PANEL WITH GFR
ALT: 31 U/L (ref 0–44)
AST: 33 U/L (ref 15–41)
Albumin: 4.2 g/dL (ref 3.5–5.0)
Alkaline Phosphatase: 131 U/L — ABNORMAL HIGH (ref 38–126)
Anion gap: 13 (ref 5–15)
BUN: 12 mg/dL (ref 6–20)
CO2: 25 mmol/L (ref 22–32)
Calcium: 9.8 mg/dL (ref 8.9–10.3)
Chloride: 104 mmol/L (ref 98–111)
Creatinine, Ser: 0.66 mg/dL (ref 0.44–1.00)
GFR, Estimated: >60 mL/min (ref >60)
Glucose, Bld: 94 mg/dL (ref 70–99)
Potassium: 3.1 mmol/L — ABNORMAL LOW (ref 3.5–5.1)
Sodium: 142 mmol/L (ref 135–145)
Total Bilirubin: <0.2 mg/dL (ref 0.0–1.2)
Total Protein: 7.5 g/dL (ref 6.5–8.1)

## 2024-06-12 LAB — LIPASE, BLOOD: Lipase: 14 U/L (ref 11–51)

## 2024-06-12 MED ORDER — ALUM & MAG HYDROXIDE-SIMETH 200-200-20 MG/5ML PO SUSP
30.0000 mL | Freq: Once | ORAL | Status: AC
  Administered 2024-06-12: 16:00:00 30 mL via ORAL

## 2024-06-12 MED ORDER — ALUM & MAG HYDROXIDE-SIMETH 200-200-20 MG/5ML PO SUSP
ORAL | Status: AC
  Filled 2024-06-12: qty 30

## 2024-06-12 NOTE — Discharge Instructions (Addendum)
 You were seen today for abdominal pain, bloating, and diarrhea.  Your xray appears normal.  If the radiologist reads this differently we will notify you.  Your blood work will be resulted tomorrow, and if anything is abnormal you will be notified.  Your stool studies should be resulted several days after you have completed them.  You will be notified if anything is abnormal on these as well.  You can see these results in mychart.  In the mean time I have sent several medications to the pharmacy to see if helpful.  You may also try gas-x or beano for the bloating.  You may need to follow up with gastroenterology for further care, regardless of these results.   You may call [facility] at [phone], or another of your choosing.  If you have worsening pain then please to the ER for further evaluation.

## 2024-06-12 NOTE — ED Triage Notes (Signed)
 Pt reports has bad gut health. For 2 months everything eats will have loose stools after eating. Reports dull ache in med stomach and nausea. Took zofran  for nausea but doesn't have any more of it.

## 2024-06-12 NOTE — ED Provider Notes (Signed)
 [facility]    CSN: [id number] Arrival date & time: 06/12/24  1510      History   Chief Complaint Chief Complaint  Patient presents with   Diarrhea    HPI [name] is a 53 y.o. female.    Diarrhea Associated symptoms: abdominal pain    For about a month, everything she ate would go right through her with diarrhea.  For the last several weeks it was anything she ate.  Last night she ate yogurt, had to have 4bms after that.   Today she had some pain to the upper abdomen, which she does not normally.  She also has had bloating, just within the hour.  She states this happens with her acid acting up.  She thinks she has this issue, along with the frequent diarrhea.  She does have gerd, but this diarrhea is new for her.  She does not take any mediations for her stomach at the moment.  No abx use prior to this.  In 2023 she had a colonoscopy and EGD, normal.   She has not had any solid, formed Bms in the last month or so.        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    Patient Active Problem List   Diagnosis Date Noted   DOE (dyspnea on exertion) 01/31/2024   Solitary pulmonary nodule on lung CT 01/31/2024   Cigarette smoker 01/31/2024   Aortic atherosclerosis 07/27/2023   Coronary artery disease involving native coronary artery of native heart without angina pectoris 07/27/2023   Obesity (BMI 30-39.9) 07/27/2023   Progressive angina (HCC) 04/04/2023   History of syphilis 08/04/2021   Vaginal candidiasis 08/04/2021   Dysphagia 08/02/2021   GAD (generalized anxiety disorder) 05/09/2021   MDD (major depressive disorder), recurrent  episode, severe (HCC) 05/03/2021   Benign essential HTN 05/07/2019   Depression, unspecified 05/05/2019   Alcohol use disorder, moderate, dependence (HCC) 02/08/2019   Cannabis use disorder, moderate, dependence (HCC) 02/08/2019   Tobacco use disorder 02/08/2019   Cocaine dependence (HCC) 06/24/2015   PTSD (post-traumatic stress disorder) 06/24/2015   Suicidal ideations 10/20/2013   Polysubstance abuse (HCC) 10/20/2013   Psychosis (HCC) 10/20/2013    Past Surgical History:  Procedure Laterality Date   CORONARY IMAGING/OCT N/A 04/05/2023   Procedure: CORONARY IMAGING/OCT;  Surgeon: Wendel Lurena POUR, MD;  Location: [facility];  Service: Cardiovascular;  Laterality: N/A;   CORONARY PRESSURE/FFR STUDY N/A 04/05/2023   Procedure: CORONARY PRESSURE/FFR STUDY;  Surgeon: Wendel Lurena POUR, MD;  Location: [facility];  Service: Cardiovascular;  Laterality: N/A;   CORONARY STENT INTERVENTION N/A 04/05/2023   Procedure: CORONARY STENT INTERVENTION;  Surgeon: Wendel Lurena POUR, MD;  Location: [facility];  Service: Cardiovascular;  Laterality: N/A;   LEFT HEART CATH AND CORONARY ANGIOGRAPHY N/A 04/05/2023   Procedure: LEFT HEART CATH AND CORONARY ANGIOGRAPHY;  Surgeon: Wendel Lurena POUR, MD;  Location: [facility];  Service: Cardiovascular;  Laterality: N/A;   TUBAL LIGATION      OB  History     Gravida      Para      Term      Preterm      AB      Living         SAB      IAB      Ectopic      Multiple      Live Births  2            Home Medications    Prior to Admission medications  Medication Sig Start Date End Date Taking? Authorizing Provider  busPIRone (BUSPAR) 7.5 MG tablet Take 7.5 mg by mouth 3 (three) times daily. 04/01/24  Yes [provider]  albuterol  (ACCUNEB ) 1.25 MG/3ML nebulizer solution Take 1 ampule by nebulization every 6 (six) hours as needed for wheezing.    [provider]  albuterol  (PROVENTIL ) (2.5  MG/3ML) 0.083% nebulizer solution Take 3 mLs (2.5 mg total) by nebulization every 6 (six) hours as needed for wheezing or shortness of breath. 01/31/24   Darlean Ozell NOVAK, MD  albuterol  (VENTOLIN  HFA) 108 (90 Base) MCG/ACT inhaler Inhale 2 puffs into the lungs every 6 (six) hours as needed for wheezing or shortness of breath. 01/31/24   Darlean Ozell NOVAK, MD  amLODipine  (NORVASC ) 5 MG tablet Take 5 mg by mouth daily. 07/06/23   [provider]  aspirin  EC 81 MG tablet Take 81 mg by mouth daily. Swallow whole.    [provider]  atorvastatin  (LIPITOR) 80 MG tablet Take 1 tablet (80 mg total) by mouth daily. 06/13/23 01/31/24  Madie Jon Garre, PA  escitalopram  (LEXAPRO ) 10 MG tablet Take 10 mg by mouth daily. 03/28/23   [provider]  fluticasone -salmeterol (ADVAIR HFA) 115-21 MCG/ACT inhaler Inhale 2 puffs into the lungs 2 (two) times daily. 02/01/24   Darlean Ozell NOVAK, MD  gabapentin  (NEURONTIN ) 600 MG tablet Take 600 mg by mouth 3 (three) times daily. 04/03/22   [provider]  lurasidone  (LATUDA ) 40 MG TABS tablet Take 40 mg by mouth every evening. 03/28/23   [provider]  metoprolol  succinate (TOPROL -XL) 25 MG 24 hr tablet Take 1 tablet (25 mg total) by mouth daily. 06/13/23   Duke, Jon Garre, PA  montelukast  (SINGULAIR ) 10 MG tablet Take 10 mg by mouth daily. 05/25/22   [provider]  nitroGLYCERIN  (NITROSTAT ) 0.4 MG SL tablet Place 1 tablet (0.4 mg total) under the tongue every 5 (five) minutes as needed. 06/13/23   Duke, Jon Garre, PA  omeprazole (PRILOSEC) 20 MG capsule Take 20 mg by mouth daily. 02/07/22   [provider]  ondansetron  (ZOFRAN ) 4 MG tablet Take 1 tablet (4 mg total) by mouth every 6 (six) hours as needed for nausea or vomiting. 05/05/22   Beather Delon Gibson, PA  ondansetron  (ZOFRAN -ODT) 4 MG disintegrating tablet Take 1 tablet (4 mg total) by mouth every 8 (eight) hours as needed for nausea or vomiting.  01/30/24   Christopher Savannah, PA-C  OXcarbazepine  (TRILEPTAL ) 150 MG tablet Take 150 mg by mouth 2 (two) times daily. 07/06/23   [provider]  rosuvastatin (CRESTOR) 40 MG tablet Take 40 mg by mouth daily. 07/06/23   [provider]  sertraline  (ZOLOFT ) 25 MG tablet Take 25 mg by mouth 2 (two) times daily. 07/06/23   [provider]  ticagrelor  (BRILINTA ) 90 MG TABS tablet Take 1 tablet (90 mg total) by mouth 2 (two) times daily. 06/13/23   Madie Jon Garre,  PA  traZODone  (DESYREL ) 100 MG tablet Take 100 mg by mouth at bedtime. 03/30/22   [provider]  triamcinolone ointment (KENALOG) 0.1 % Apply topically 2 (two) times daily. 03/04/22   [provider]  trospium  (SANCTURA ) 20 MG tablet TAKE 1 TABLET(20 MG) BY MOUTH TWICE DAILY Patient not taking: Reported on 01/31/2024 03/30/23   Ajewole, Christana, MD  Varenicline  Tartrate, Starter, 0.5 MG X 11 & 1 MG X 42 TBPK Take 0.5 mg x 11 & 1x 42 po TBPK 06/13/23   Duke, Jon Garre, PA  clonazePAM (KLONOPIN) 0.5 MG tablet Take 0.5 mg by mouth 3 (three) times daily as needed for anxiety.  04/06/20  [provider]  pantoprazole  (PROTONIX ) 40 MG tablet Take 40 mg by mouth daily.  10/23/13 04/06/20  [provider]    Family History Family History  Problem Relation Age of Onset   Diabetes Mother    Hypertension Mother    Heart disease Maternal Grandmother    Breast cancer Neg Hx     Social History Social History[1]   Allergies   Benadryl [diphenhydramine], Penicillins, and Latex   Review of Systems Review of Systems  Constitutional: Negative.   HENT: Negative.    Respiratory: Negative.    Cardiovascular: Negative.   Gastrointestinal:  Positive for abdominal pain and diarrhea.  Genitourinary: Negative.   Musculoskeletal: Negative.   Hematological: Negative.   Psychiatric/Behavioral: Negative.       Physical Exam Triage Vital Signs ED Triage Vitals  Encounter Vitals Group      BP 06/12/24 1527 124/79     Girls Systolic BP Percentile --      Girls Diastolic BP Percentile --      Boys Systolic BP Percentile --      Boys Diastolic BP Percentile --      Pulse Rate 06/12/24 1527 75     Resp 06/12/24 1527 18     Temp 06/12/24 1527 97.8 F (36.6 C)     Temp Source 06/12/24 1527 Oral     SpO2 06/12/24 1527 99 %     Weight --      Height --      Head Circumference --      Peak Flow --      Pain Score 06/12/24 1524 8     Pain Loc --      Pain Education --      Exclude from Growth Chart --    No data found.  Updated Vital Signs BP 124/79 (BP Location: Right Arm)   Pulse 75   Temp 97.8 F (36.6 C) (Oral)   Resp 18   SpO2 99%   Visual Acuity Right Eye Distance:   Left Eye Distance:   Bilateral Distance:    Right Eye Near:   Left Eye Near:    Bilateral Near:     Physical Exam Constitutional:      General: She is not in acute distress.    Appearance: Normal appearance. She is normal weight. She is not ill-appearing or toxic-appearing.  Cardiovascular:     Rate and Rhythm: Normal rate and regular rhythm.  Pulmonary:     Effort: Pulmonary effort is normal.     Breath sounds: Normal breath sounds.  Abdominal:     General: Bowel sounds are increased. There is distension.     Palpations: Abdomen is soft.     Tenderness: There is no guarding or rebound.     Comments: Abdomen is bloated, distended;  She has TTP to the upper abdomen, just above the umbilicus;    Neurological:     Mental Status: She is alert.      UC Treatments / Results  Labs (all labs ordered are listed, but only abnormal results are displayed) Labs Reviewed - No data to display  EKG   Radiology No results found.  Procedures Procedures (including critical care time)  Medications Ordered in UC Medications - No data to display  Initial Impression / Assessment and Plan / UC Course  I have reviewed the triage vital signs and the nursing notes.  Pertinent labs & imaging  results that were available during my care of the patient were reviewed by me and considered in my medical decision making (see chart for details).   Final Clinical Impressions(s) / UC Diagnoses   Final diagnoses:  Abdominal pain, unspecified abdominal location  Diarrhea, unspecified type  Abdominal bloating     Discharge Instructions      You were seen today for abdominal pain, bloating, and diarrhea.  Your xray appears normal.  If the radiologist reads this differently we will notify you.  Your blood work will be resulted tomorrow, and if anything is abnormal you will be notified.  Your stool studies should be resulted several days after you have completed them.  You will be notified if anything is abnormal on these as well.  You can see these results in mychart.  In the mean time I have sent several medications to the pharmacy to see if helpful.  You may also try gas-x or beano for the bloating.  You may need to follow up with gastroenterology for further care, regardless of these results.   You may call [facility] at [phone], or another of your choosing.  If you have worsening pain then please to the ER for further evaluation.     ED Prescriptions     Medication Sig Dispense Auth. Provider   esomeprazole (NEXIUM) 40 MG capsule Take 1 capsule (40 mg total) by mouth daily. 30 capsule Ree Alcalde, MD   dicyclomine (BENTYL) 20 MG tablet Take 1 tablet (20 mg total) by mouth 2 (two) times daily. 20 tablet Darral Longs, MD      PDMP not reviewed this encounter.     [1]  Social History Tobacco Use   Smoking status: Every Day    Current packs/day: 0.50    Average packs/day: 0.5 packs/day for 29.0 years (14.5 ttl pk-yrs)    Types: Cigarettes   Smokeless tobacco: Never   Tobacco comments:    in rehab  Vaping Use   Vaping status: Never Used  Substance Use Topics   Alcohol use: Not Currently   Drug use: Not Currently    Types: Cocaine, Marijuana     Comment: crack     Darral Longs, MD 06/12/24 [phone]

## 2024-06-13 ENCOUNTER — Ambulatory Visit (HOSPITAL_COMMUNITY): Payer: Self-pay
